# Patient Record
Sex: Female | Born: 1975 | Race: White | Hispanic: No | Marital: Single | Smoking: Former smoker
Health system: Southern US, Community
[De-identification: ages and names within clinical notes are randomized; demographics above are authoritative.]

## PROBLEM LIST (undated history)

## (undated) DIAGNOSIS — Z9889 Other specified postprocedural states: Secondary | ICD-10-CM

## (undated) DIAGNOSIS — K5900 Constipation, unspecified: Secondary | ICD-10-CM

## (undated) DIAGNOSIS — K219 Gastro-esophageal reflux disease without esophagitis: Secondary | ICD-10-CM

## (undated) DIAGNOSIS — G5601 Carpal tunnel syndrome, right upper limb: Secondary | ICD-10-CM

## (undated) DIAGNOSIS — Z87442 Personal history of urinary calculi: Secondary | ICD-10-CM

## (undated) DIAGNOSIS — R112 Nausea with vomiting, unspecified: Secondary | ICD-10-CM

## (undated) DIAGNOSIS — D0511 Intraductal carcinoma in situ of right breast: Secondary | ICD-10-CM

## (undated) DIAGNOSIS — G8929 Other chronic pain: Secondary | ICD-10-CM

## (undated) DIAGNOSIS — G43909 Migraine, unspecified, not intractable, without status migrainosus: Secondary | ICD-10-CM

## (undated) DIAGNOSIS — G589 Mononeuropathy, unspecified: Secondary | ICD-10-CM

## (undated) DIAGNOSIS — S149XXA Injury of unspecified nerves of neck, initial encounter: Secondary | ICD-10-CM

## (undated) DIAGNOSIS — D649 Anemia, unspecified: Secondary | ICD-10-CM

## (undated) DIAGNOSIS — F419 Anxiety disorder, unspecified: Secondary | ICD-10-CM

## (undated) DIAGNOSIS — T4145XA Adverse effect of unspecified anesthetic, initial encounter: Secondary | ICD-10-CM

## (undated) DIAGNOSIS — N939 Abnormal uterine and vaginal bleeding, unspecified: Secondary | ICD-10-CM

## (undated) DIAGNOSIS — T8859XA Other complications of anesthesia, initial encounter: Secondary | ICD-10-CM

## (undated) DIAGNOSIS — Z803 Family history of malignant neoplasm of breast: Secondary | ICD-10-CM

## (undated) DIAGNOSIS — M549 Dorsalgia, unspecified: Secondary | ICD-10-CM

## (undated) DIAGNOSIS — R0981 Nasal congestion: Secondary | ICD-10-CM

## (undated) HISTORY — DX: Family history of malignant neoplasm of breast: Z80.3

## (undated) HISTORY — DX: Carpal tunnel syndrome, right upper limb: G56.01

## (undated) HISTORY — PX: FRACTURE SURGERY: SHX138

## (undated) HISTORY — PX: SINUS ENDO W/FUSION: SHX777

## (undated) HISTORY — DX: Intraductal carcinoma in situ of right breast: D05.11

---

## 1998-05-13 ENCOUNTER — Encounter: Admission: RE | Admit: 1998-05-13 | Discharge: 1998-05-13 | Payer: Self-pay | Admitting: Sports Medicine

## 1998-07-06 ENCOUNTER — Encounter: Admission: RE | Admit: 1998-07-06 | Discharge: 1998-07-06 | Payer: Self-pay | Admitting: Family Medicine

## 1998-09-17 ENCOUNTER — Encounter: Admission: RE | Admit: 1998-09-17 | Discharge: 1998-09-17 | Payer: Self-pay | Admitting: Family Medicine

## 1998-09-20 ENCOUNTER — Encounter: Admission: RE | Admit: 1998-09-20 | Discharge: 1998-09-20 | Payer: Self-pay | Admitting: Family Medicine

## 1998-10-26 ENCOUNTER — Encounter: Admission: RE | Admit: 1998-10-26 | Discharge: 1998-10-26 | Payer: Self-pay | Admitting: Sports Medicine

## 1998-10-27 ENCOUNTER — Encounter: Admission: RE | Admit: 1998-10-27 | Discharge: 1998-10-27 | Payer: Self-pay | Admitting: Family Medicine

## 1998-11-03 ENCOUNTER — Encounter: Admission: RE | Admit: 1998-11-03 | Discharge: 1998-11-03 | Payer: Self-pay | Admitting: Family Medicine

## 1998-11-03 ENCOUNTER — Other Ambulatory Visit: Admission: RE | Admit: 1998-11-03 | Discharge: 1998-11-03 | Payer: Self-pay | Admitting: Family Medicine

## 1998-12-10 ENCOUNTER — Encounter: Admission: RE | Admit: 1998-12-10 | Discharge: 1998-12-10 | Payer: Self-pay | Admitting: Sports Medicine

## 1999-01-18 ENCOUNTER — Encounter: Admission: RE | Admit: 1999-01-18 | Discharge: 1999-01-18 | Payer: Self-pay | Admitting: Sports Medicine

## 1999-03-08 ENCOUNTER — Encounter: Admission: RE | Admit: 1999-03-08 | Discharge: 1999-03-08 | Payer: Self-pay | Admitting: Family Medicine

## 1999-04-06 ENCOUNTER — Encounter: Admission: RE | Admit: 1999-04-06 | Discharge: 1999-04-06 | Payer: Self-pay | Admitting: Family Medicine

## 1999-04-20 ENCOUNTER — Encounter: Admission: RE | Admit: 1999-04-20 | Discharge: 1999-04-20 | Payer: Self-pay | Admitting: Family Medicine

## 1999-06-06 ENCOUNTER — Encounter: Admission: RE | Admit: 1999-06-06 | Discharge: 1999-06-06 | Payer: Self-pay | Admitting: Family Medicine

## 1999-08-02 ENCOUNTER — Emergency Department (HOSPITAL_COMMUNITY): Admission: EM | Admit: 1999-08-02 | Discharge: 1999-08-02 | Payer: Self-pay | Admitting: Emergency Medicine

## 1999-09-14 ENCOUNTER — Encounter: Admission: RE | Admit: 1999-09-14 | Discharge: 1999-09-14 | Payer: Self-pay | Admitting: Family Medicine

## 1999-09-21 ENCOUNTER — Encounter: Admission: RE | Admit: 1999-09-21 | Discharge: 1999-09-21 | Payer: Self-pay | Admitting: Family Medicine

## 1999-09-28 ENCOUNTER — Encounter: Admission: RE | Admit: 1999-09-28 | Discharge: 1999-09-28 | Payer: Self-pay | Admitting: Family Medicine

## 1999-12-14 ENCOUNTER — Encounter: Admission: RE | Admit: 1999-12-14 | Discharge: 1999-12-14 | Payer: Self-pay | Admitting: Family Medicine

## 2000-01-02 ENCOUNTER — Encounter: Admission: RE | Admit: 2000-01-02 | Discharge: 2000-01-02 | Payer: Self-pay | Admitting: Family Medicine

## 2000-01-18 ENCOUNTER — Encounter: Admission: RE | Admit: 2000-01-18 | Discharge: 2000-01-18 | Payer: Self-pay | Admitting: Family Medicine

## 2000-02-10 ENCOUNTER — Other Ambulatory Visit: Admission: RE | Admit: 2000-02-10 | Discharge: 2000-02-10 | Payer: Self-pay | Admitting: Family Medicine

## 2000-02-10 ENCOUNTER — Encounter: Admission: RE | Admit: 2000-02-10 | Discharge: 2000-02-10 | Payer: Self-pay | Admitting: Family Medicine

## 2000-02-23 ENCOUNTER — Emergency Department (HOSPITAL_COMMUNITY): Admission: EM | Admit: 2000-02-23 | Discharge: 2000-02-23 | Payer: Self-pay | Admitting: Emergency Medicine

## 2000-02-23 ENCOUNTER — Encounter: Payer: Self-pay | Admitting: Emergency Medicine

## 2000-03-07 ENCOUNTER — Encounter: Admission: RE | Admit: 2000-03-07 | Discharge: 2000-03-07 | Payer: Self-pay | Admitting: Family Medicine

## 2000-03-20 ENCOUNTER — Encounter: Admission: RE | Admit: 2000-03-20 | Discharge: 2000-03-20 | Payer: Self-pay | Admitting: Family Medicine

## 2000-06-08 ENCOUNTER — Encounter: Admission: RE | Admit: 2000-06-08 | Discharge: 2000-06-08 | Payer: Self-pay | Admitting: Family Medicine

## 2000-06-15 ENCOUNTER — Encounter: Admission: RE | Admit: 2000-06-15 | Discharge: 2000-06-15 | Payer: Self-pay | Admitting: Family Medicine

## 2000-07-09 ENCOUNTER — Encounter: Admission: RE | Admit: 2000-07-09 | Discharge: 2000-07-09 | Payer: Self-pay | Admitting: Family Medicine

## 2000-07-11 ENCOUNTER — Encounter: Admission: RE | Admit: 2000-07-11 | Discharge: 2000-07-11 | Payer: Self-pay | Admitting: Family Medicine

## 2000-07-16 ENCOUNTER — Encounter: Admission: RE | Admit: 2000-07-16 | Discharge: 2000-07-16 | Payer: Self-pay | Admitting: Family Medicine

## 2000-09-07 ENCOUNTER — Encounter: Admission: RE | Admit: 2000-09-07 | Discharge: 2000-09-07 | Payer: Self-pay | Admitting: Family Medicine

## 2000-11-13 ENCOUNTER — Encounter: Admission: RE | Admit: 2000-11-13 | Discharge: 2000-11-13 | Payer: Self-pay | Admitting: Family Medicine

## 2000-11-15 ENCOUNTER — Encounter: Payer: Self-pay | Admitting: *Deleted

## 2000-11-15 ENCOUNTER — Encounter: Admission: RE | Admit: 2000-11-15 | Discharge: 2000-11-15 | Payer: Self-pay | Admitting: *Deleted

## 2000-11-19 ENCOUNTER — Encounter: Admission: RE | Admit: 2000-11-19 | Discharge: 2001-01-10 | Payer: Self-pay | Admitting: Sports Medicine

## 2000-11-28 ENCOUNTER — Encounter: Admission: RE | Admit: 2000-11-28 | Discharge: 2000-11-28 | Payer: Self-pay | Admitting: Family Medicine

## 2000-12-07 ENCOUNTER — Encounter: Admission: RE | Admit: 2000-12-07 | Discharge: 2000-12-07 | Payer: Self-pay | Admitting: Sports Medicine

## 2000-12-31 ENCOUNTER — Encounter: Admission: RE | Admit: 2000-12-31 | Discharge: 2000-12-31 | Payer: Self-pay | Admitting: Family Medicine

## 2001-01-29 ENCOUNTER — Encounter: Admission: RE | Admit: 2001-01-29 | Discharge: 2001-01-29 | Payer: Self-pay | Admitting: Family Medicine

## 2001-03-04 ENCOUNTER — Encounter: Admission: RE | Admit: 2001-03-04 | Discharge: 2001-03-04 | Payer: Self-pay | Admitting: Family Medicine

## 2001-03-15 ENCOUNTER — Encounter: Admission: RE | Admit: 2001-03-15 | Discharge: 2001-03-15 | Payer: Self-pay | Admitting: Family Medicine

## 2001-04-04 ENCOUNTER — Encounter: Admission: RE | Admit: 2001-04-04 | Discharge: 2001-04-04 | Payer: Self-pay | Admitting: Family Medicine

## 2001-04-04 ENCOUNTER — Other Ambulatory Visit: Admission: RE | Admit: 2001-04-04 | Discharge: 2001-04-04 | Payer: Self-pay | Admitting: *Deleted

## 2001-05-27 ENCOUNTER — Encounter: Admission: RE | Admit: 2001-05-27 | Discharge: 2001-05-27 | Payer: Self-pay | Admitting: Family Medicine

## 2001-06-11 ENCOUNTER — Encounter: Admission: RE | Admit: 2001-06-11 | Discharge: 2001-06-11 | Payer: Self-pay | Admitting: Family Medicine

## 2001-06-25 ENCOUNTER — Encounter: Admission: RE | Admit: 2001-06-25 | Discharge: 2001-06-25 | Payer: Self-pay | Admitting: Family Medicine

## 2001-08-07 ENCOUNTER — Encounter: Admission: RE | Admit: 2001-08-07 | Discharge: 2001-08-07 | Payer: Self-pay | Admitting: Family Medicine

## 2001-08-19 ENCOUNTER — Encounter: Admission: RE | Admit: 2001-08-19 | Discharge: 2001-08-19 | Payer: Self-pay | Admitting: Pediatrics

## 2001-08-27 ENCOUNTER — Encounter: Admission: RE | Admit: 2001-08-27 | Discharge: 2001-08-27 | Payer: Self-pay | Admitting: Family Medicine

## 2001-11-11 ENCOUNTER — Encounter: Admission: RE | Admit: 2001-11-11 | Discharge: 2001-11-11 | Payer: Self-pay | Admitting: Family Medicine

## 2002-02-03 ENCOUNTER — Encounter: Admission: RE | Admit: 2002-02-03 | Discharge: 2002-02-03 | Payer: Self-pay | Admitting: Family Medicine

## 2002-04-30 ENCOUNTER — Encounter: Admission: RE | Admit: 2002-04-30 | Discharge: 2002-04-30 | Payer: Self-pay | Admitting: Family Medicine

## 2002-07-23 ENCOUNTER — Encounter: Admission: RE | Admit: 2002-07-23 | Discharge: 2002-07-23 | Payer: Self-pay | Admitting: Family Medicine

## 2002-10-15 ENCOUNTER — Encounter: Admission: RE | Admit: 2002-10-15 | Discharge: 2002-10-15 | Payer: Self-pay | Admitting: Family Medicine

## 2002-11-18 ENCOUNTER — Encounter: Admission: RE | Admit: 2002-11-18 | Discharge: 2002-11-18 | Payer: Self-pay | Admitting: Family Medicine

## 2003-01-08 ENCOUNTER — Encounter: Admission: RE | Admit: 2003-01-08 | Discharge: 2003-01-08 | Payer: Self-pay | Admitting: Family Medicine

## 2003-01-23 ENCOUNTER — Encounter: Admission: RE | Admit: 2003-01-23 | Discharge: 2003-01-23 | Payer: Self-pay | Admitting: Family Medicine

## 2003-02-26 ENCOUNTER — Emergency Department (HOSPITAL_COMMUNITY): Admission: EM | Admit: 2003-02-26 | Discharge: 2003-02-26 | Payer: Self-pay | Admitting: Emergency Medicine

## 2003-02-26 ENCOUNTER — Encounter: Payer: Self-pay | Admitting: Emergency Medicine

## 2003-03-16 ENCOUNTER — Encounter: Admission: RE | Admit: 2003-03-16 | Discharge: 2003-03-16 | Payer: Self-pay | Admitting: Family Medicine

## 2003-04-08 ENCOUNTER — Emergency Department (HOSPITAL_COMMUNITY): Admission: EM | Admit: 2003-04-08 | Discharge: 2003-04-08 | Payer: Self-pay | Admitting: Emergency Medicine

## 2003-05-19 ENCOUNTER — Encounter: Admission: RE | Admit: 2003-05-19 | Discharge: 2003-05-19 | Payer: Self-pay | Admitting: Family Medicine

## 2003-06-29 ENCOUNTER — Encounter: Admission: RE | Admit: 2003-06-29 | Discharge: 2003-06-29 | Payer: Self-pay | Admitting: Sports Medicine

## 2003-07-10 ENCOUNTER — Encounter: Admission: RE | Admit: 2003-07-10 | Discharge: 2003-07-10 | Payer: Self-pay | Admitting: Family Medicine

## 2003-07-16 ENCOUNTER — Encounter: Admission: RE | Admit: 2003-07-16 | Discharge: 2003-07-16 | Payer: Self-pay | Admitting: Family Medicine

## 2003-08-25 ENCOUNTER — Encounter: Admission: RE | Admit: 2003-08-25 | Discharge: 2003-08-25 | Payer: Self-pay | Admitting: Family Medicine

## 2003-08-26 ENCOUNTER — Emergency Department (HOSPITAL_COMMUNITY): Admission: EM | Admit: 2003-08-26 | Discharge: 2003-08-26 | Payer: Self-pay | Admitting: Emergency Medicine

## 2003-08-27 ENCOUNTER — Emergency Department (HOSPITAL_COMMUNITY): Admission: EM | Admit: 2003-08-27 | Discharge: 2003-08-28 | Payer: Self-pay | Admitting: *Deleted

## 2003-08-31 ENCOUNTER — Encounter: Admission: RE | Admit: 2003-08-31 | Discharge: 2003-08-31 | Payer: Self-pay | Admitting: Family Medicine

## 2003-11-09 ENCOUNTER — Encounter: Admission: RE | Admit: 2003-11-09 | Discharge: 2003-11-09 | Payer: Self-pay | Admitting: Family Medicine

## 2003-12-22 ENCOUNTER — Other Ambulatory Visit: Admission: RE | Admit: 2003-12-22 | Discharge: 2003-12-22 | Payer: Self-pay | Admitting: Family Medicine

## 2003-12-22 ENCOUNTER — Encounter: Admission: RE | Admit: 2003-12-22 | Discharge: 2003-12-22 | Payer: Self-pay | Admitting: Family Medicine

## 2004-01-15 ENCOUNTER — Encounter: Admission: RE | Admit: 2004-01-15 | Discharge: 2004-01-15 | Payer: Self-pay | Admitting: Family Medicine

## 2004-01-18 ENCOUNTER — Encounter: Admission: RE | Admit: 2004-01-18 | Discharge: 2004-01-18 | Payer: Self-pay | Admitting: Family Medicine

## 2004-02-02 ENCOUNTER — Encounter: Admission: RE | Admit: 2004-02-02 | Discharge: 2004-02-02 | Payer: Self-pay | Admitting: Family Medicine

## 2004-02-04 ENCOUNTER — Encounter: Admission: RE | Admit: 2004-02-04 | Discharge: 2004-02-04 | Payer: Self-pay | Admitting: Family Medicine

## 2004-03-08 ENCOUNTER — Encounter: Admission: RE | Admit: 2004-03-08 | Discharge: 2004-03-08 | Payer: Self-pay | Admitting: Family Medicine

## 2004-06-27 ENCOUNTER — Encounter: Admission: RE | Admit: 2004-06-27 | Discharge: 2004-06-27 | Payer: Self-pay | Admitting: Family Medicine

## 2004-08-10 ENCOUNTER — Ambulatory Visit: Payer: Self-pay | Admitting: Family Medicine

## 2004-08-16 ENCOUNTER — Ambulatory Visit: Payer: Self-pay | Admitting: Family Medicine

## 2004-08-31 ENCOUNTER — Ambulatory Visit: Payer: Self-pay | Admitting: Family Medicine

## 2004-09-02 ENCOUNTER — Ambulatory Visit: Payer: Self-pay | Admitting: Family Medicine

## 2004-09-09 ENCOUNTER — Ambulatory Visit: Payer: Self-pay | Admitting: Sports Medicine

## 2004-09-16 ENCOUNTER — Ambulatory Visit: Payer: Self-pay | Admitting: Family Medicine

## 2004-11-23 ENCOUNTER — Inpatient Hospital Stay (HOSPITAL_COMMUNITY): Admission: AD | Admit: 2004-11-23 | Discharge: 2004-11-23 | Payer: Self-pay | Admitting: *Deleted

## 2004-11-25 ENCOUNTER — Ambulatory Visit: Payer: Self-pay | Admitting: Sports Medicine

## 2004-12-01 ENCOUNTER — Ambulatory Visit: Payer: Self-pay | Admitting: Family Medicine

## 2004-12-01 ENCOUNTER — Other Ambulatory Visit: Admission: RE | Admit: 2004-12-01 | Discharge: 2004-12-01 | Payer: Self-pay | Admitting: Family Medicine

## 2005-01-13 ENCOUNTER — Ambulatory Visit: Payer: Self-pay | Admitting: Sports Medicine

## 2005-01-31 ENCOUNTER — Ambulatory Visit: Payer: Self-pay | Admitting: Family Medicine

## 2005-03-09 ENCOUNTER — Ambulatory Visit: Payer: Self-pay | Admitting: Sports Medicine

## 2005-04-04 ENCOUNTER — Ambulatory Visit: Payer: Self-pay | Admitting: Family Medicine

## 2005-04-19 ENCOUNTER — Ambulatory Visit: Payer: Self-pay | Admitting: Family Medicine

## 2005-05-02 ENCOUNTER — Ambulatory Visit: Payer: Self-pay | Admitting: Family Medicine

## 2005-05-08 ENCOUNTER — Ambulatory Visit: Payer: Self-pay | Admitting: Family Medicine

## 2005-05-17 ENCOUNTER — Ambulatory Visit: Payer: Self-pay | Admitting: Family Medicine

## 2005-05-24 ENCOUNTER — Other Ambulatory Visit: Admission: RE | Admit: 2005-05-24 | Discharge: 2005-05-24 | Payer: Self-pay | Admitting: Family Medicine

## 2005-05-24 ENCOUNTER — Ambulatory Visit: Payer: Self-pay | Admitting: Family Medicine

## 2005-05-25 ENCOUNTER — Ambulatory Visit (HOSPITAL_COMMUNITY): Admission: RE | Admit: 2005-05-25 | Discharge: 2005-05-25 | Payer: Self-pay | Admitting: Family Medicine

## 2005-06-20 ENCOUNTER — Ambulatory Visit: Payer: Self-pay | Admitting: Family Medicine

## 2005-06-27 ENCOUNTER — Ambulatory Visit: Payer: Self-pay | Admitting: Sports Medicine

## 2005-07-26 ENCOUNTER — Ambulatory Visit: Payer: Self-pay | Admitting: Family Medicine

## 2005-07-31 ENCOUNTER — Ambulatory Visit (HOSPITAL_COMMUNITY): Admission: RE | Admit: 2005-07-31 | Discharge: 2005-07-31 | Payer: Self-pay | Admitting: Obstetrics and Gynecology

## 2005-08-03 ENCOUNTER — Ambulatory Visit: Payer: Self-pay | Admitting: Family Medicine

## 2005-08-18 ENCOUNTER — Inpatient Hospital Stay (HOSPITAL_COMMUNITY): Admission: AD | Admit: 2005-08-18 | Discharge: 2005-08-18 | Payer: Self-pay | Admitting: Obstetrics & Gynecology

## 2005-08-21 ENCOUNTER — Ambulatory Visit: Payer: Self-pay | Admitting: Sports Medicine

## 2005-09-07 ENCOUNTER — Ambulatory Visit: Payer: Self-pay | Admitting: Family Medicine

## 2005-09-11 ENCOUNTER — Ambulatory Visit: Payer: Self-pay | Admitting: Family Medicine

## 2005-10-05 ENCOUNTER — Ambulatory Visit: Payer: Self-pay | Admitting: *Deleted

## 2005-10-05 ENCOUNTER — Inpatient Hospital Stay (HOSPITAL_COMMUNITY): Admission: AD | Admit: 2005-10-05 | Discharge: 2005-10-05 | Payer: Self-pay | Admitting: Obstetrics & Gynecology

## 2005-10-18 ENCOUNTER — Ambulatory Visit: Payer: Self-pay | Admitting: Family Medicine

## 2005-10-24 ENCOUNTER — Ambulatory Visit (HOSPITAL_COMMUNITY): Admission: RE | Admit: 2005-10-24 | Discharge: 2005-10-24 | Payer: Self-pay | Admitting: Family Medicine

## 2005-11-02 ENCOUNTER — Inpatient Hospital Stay (HOSPITAL_COMMUNITY): Admission: AD | Admit: 2005-11-02 | Discharge: 2005-11-02 | Payer: Self-pay | Admitting: Family Medicine

## 2005-11-02 ENCOUNTER — Ambulatory Visit: Payer: Self-pay | Admitting: Family Medicine

## 2005-11-04 ENCOUNTER — Ambulatory Visit: Payer: Self-pay | Admitting: Obstetrics and Gynecology

## 2005-11-04 ENCOUNTER — Inpatient Hospital Stay (HOSPITAL_COMMUNITY): Admission: AD | Admit: 2005-11-04 | Discharge: 2005-11-04 | Payer: Self-pay | Admitting: Family Medicine

## 2005-11-14 ENCOUNTER — Ambulatory Visit: Payer: Self-pay | Admitting: Family Medicine

## 2005-11-22 ENCOUNTER — Ambulatory Visit: Payer: Self-pay | Admitting: Family Medicine

## 2005-11-29 ENCOUNTER — Ambulatory Visit: Payer: Self-pay | Admitting: Family Medicine

## 2005-12-01 ENCOUNTER — Ambulatory Visit: Payer: Self-pay | Admitting: Sports Medicine

## 2005-12-08 ENCOUNTER — Ambulatory Visit: Payer: Self-pay | Admitting: Family Medicine

## 2005-12-12 ENCOUNTER — Ambulatory Visit: Payer: Self-pay | Admitting: Family Medicine

## 2005-12-21 ENCOUNTER — Inpatient Hospital Stay (HOSPITAL_COMMUNITY): Admission: AD | Admit: 2005-12-21 | Discharge: 2005-12-21 | Payer: Self-pay | Admitting: Family Medicine

## 2005-12-21 ENCOUNTER — Ambulatory Visit: Payer: Self-pay | Admitting: Family Medicine

## 2005-12-22 ENCOUNTER — Ambulatory Visit: Payer: Self-pay | Admitting: Family Medicine

## 2005-12-28 ENCOUNTER — Ambulatory Visit: Payer: Self-pay | Admitting: Sports Medicine

## 2006-01-01 ENCOUNTER — Ambulatory Visit: Payer: Self-pay | Admitting: Obstetrics and Gynecology

## 2006-01-03 ENCOUNTER — Inpatient Hospital Stay (HOSPITAL_COMMUNITY): Admission: AD | Admit: 2006-01-03 | Discharge: 2006-01-05 | Payer: Self-pay | Admitting: *Deleted

## 2006-01-03 ENCOUNTER — Ambulatory Visit: Payer: Self-pay | Admitting: Certified Nurse Midwife

## 2006-01-09 ENCOUNTER — Ambulatory Visit: Payer: Self-pay | Admitting: Sports Medicine

## 2006-02-07 ENCOUNTER — Encounter (INDEPENDENT_AMBULATORY_CARE_PROVIDER_SITE_OTHER): Payer: Self-pay | Admitting: *Deleted

## 2006-02-07 LAB — CONVERTED CEMR LAB

## 2006-02-12 ENCOUNTER — Ambulatory Visit: Payer: Self-pay | Admitting: Family Medicine

## 2006-02-14 ENCOUNTER — Ambulatory Visit: Payer: Self-pay | Admitting: Family Medicine

## 2006-02-24 ENCOUNTER — Emergency Department (HOSPITAL_COMMUNITY): Admission: EM | Admit: 2006-02-24 | Discharge: 2006-02-24 | Payer: Self-pay | Admitting: Emergency Medicine

## 2006-03-19 ENCOUNTER — Ambulatory Visit: Payer: Self-pay | Admitting: Family Medicine

## 2006-03-25 ENCOUNTER — Emergency Department (HOSPITAL_COMMUNITY): Admission: EM | Admit: 2006-03-25 | Discharge: 2006-03-26 | Payer: Self-pay | Admitting: Emergency Medicine

## 2006-04-03 ENCOUNTER — Ambulatory Visit: Payer: Self-pay | Admitting: Family Medicine

## 2006-05-28 ENCOUNTER — Ambulatory Visit: Payer: Self-pay | Admitting: Family Medicine

## 2006-05-30 ENCOUNTER — Ambulatory Visit: Payer: Self-pay | Admitting: Family Medicine

## 2006-06-04 ENCOUNTER — Ambulatory Visit: Payer: Self-pay | Admitting: Family Medicine

## 2006-06-20 ENCOUNTER — Ambulatory Visit: Payer: Self-pay | Admitting: Family Medicine

## 2006-07-05 ENCOUNTER — Ambulatory Visit: Payer: Self-pay | Admitting: Family Medicine

## 2006-07-18 ENCOUNTER — Ambulatory Visit: Payer: Self-pay | Admitting: Sports Medicine

## 2006-08-21 ENCOUNTER — Ambulatory Visit: Payer: Self-pay | Admitting: Family Medicine

## 2006-08-22 ENCOUNTER — Emergency Department (HOSPITAL_COMMUNITY): Admission: EM | Admit: 2006-08-22 | Discharge: 2006-08-22 | Payer: Self-pay | Admitting: Emergency Medicine

## 2006-08-31 ENCOUNTER — Ambulatory Visit: Payer: Self-pay | Admitting: Family Medicine

## 2006-09-10 ENCOUNTER — Emergency Department (HOSPITAL_COMMUNITY): Admission: EM | Admit: 2006-09-10 | Discharge: 2006-09-10 | Payer: Self-pay | Admitting: Emergency Medicine

## 2006-09-13 ENCOUNTER — Ambulatory Visit: Payer: Self-pay | Admitting: Sports Medicine

## 2006-10-18 ENCOUNTER — Ambulatory Visit: Payer: Self-pay | Admitting: Sports Medicine

## 2006-10-31 ENCOUNTER — Ambulatory Visit: Payer: Self-pay | Admitting: Family Medicine

## 2006-11-07 ENCOUNTER — Emergency Department (HOSPITAL_COMMUNITY): Admission: EM | Admit: 2006-11-07 | Discharge: 2006-11-07 | Payer: Self-pay | Admitting: Emergency Medicine

## 2006-11-08 ENCOUNTER — Ambulatory Visit: Payer: Self-pay | Admitting: Family Medicine

## 2007-01-18 ENCOUNTER — Ambulatory Visit: Payer: Self-pay | Admitting: Family Medicine

## 2007-01-21 ENCOUNTER — Emergency Department (HOSPITAL_COMMUNITY): Admission: EM | Admit: 2007-01-21 | Discharge: 2007-01-21 | Payer: Self-pay | Admitting: Emergency Medicine

## 2007-01-31 DIAGNOSIS — L989 Disorder of the skin and subcutaneous tissue, unspecified: Secondary | ICD-10-CM | POA: Insufficient documentation

## 2007-01-31 DIAGNOSIS — M545 Low back pain, unspecified: Secondary | ICD-10-CM | POA: Insufficient documentation

## 2007-01-31 DIAGNOSIS — K5900 Constipation, unspecified: Secondary | ICD-10-CM | POA: Insufficient documentation

## 2007-01-31 DIAGNOSIS — L708 Other acne: Secondary | ICD-10-CM | POA: Insufficient documentation

## 2007-01-31 DIAGNOSIS — J309 Allergic rhinitis, unspecified: Secondary | ICD-10-CM | POA: Insufficient documentation

## 2007-01-31 DIAGNOSIS — Z789 Other specified health status: Secondary | ICD-10-CM | POA: Insufficient documentation

## 2007-02-01 ENCOUNTER — Encounter (INDEPENDENT_AMBULATORY_CARE_PROVIDER_SITE_OTHER): Payer: Self-pay | Admitting: *Deleted

## 2007-03-04 ENCOUNTER — Emergency Department (HOSPITAL_COMMUNITY): Admission: EM | Admit: 2007-03-04 | Discharge: 2007-03-04 | Payer: Self-pay | Admitting: Emergency Medicine

## 2007-03-29 ENCOUNTER — Ambulatory Visit: Payer: Self-pay | Admitting: Family Medicine

## 2007-03-29 DIAGNOSIS — J329 Chronic sinusitis, unspecified: Secondary | ICD-10-CM | POA: Insufficient documentation

## 2007-03-29 DIAGNOSIS — N926 Irregular menstruation, unspecified: Secondary | ICD-10-CM | POA: Insufficient documentation

## 2007-03-29 LAB — CONVERTED CEMR LAB: Beta hcg, urine, semiquantitative: NEGATIVE

## 2007-04-24 ENCOUNTER — Other Ambulatory Visit: Admission: RE | Admit: 2007-04-24 | Discharge: 2007-04-24 | Payer: Self-pay | Admitting: Family Medicine

## 2007-04-24 ENCOUNTER — Ambulatory Visit: Payer: Self-pay | Admitting: Family Medicine

## 2007-04-24 ENCOUNTER — Encounter: Payer: Self-pay | Admitting: Family Medicine

## 2007-04-24 DIAGNOSIS — R51 Headache: Secondary | ICD-10-CM | POA: Insufficient documentation

## 2007-04-24 DIAGNOSIS — R519 Headache, unspecified: Secondary | ICD-10-CM | POA: Insufficient documentation

## 2007-04-24 LAB — CONVERTED CEMR LAB
ALT: 9 units/L (ref 0–35)
Alkaline Phosphatase: 46 units/L (ref 39–117)
BUN: 11 mg/dL (ref 6–23)
CO2: 24 meq/L (ref 19–32)
Calcium: 9.3 mg/dL (ref 8.4–10.5)
Chlamydia, DNA Probe: NEGATIVE
Chloride: 106 meq/L (ref 96–112)
Direct LDL: 118 mg/dL — ABNORMAL HIGH
GC Probe Amp, Genital: NEGATIVE
HCT: 38.4 %
Hemoglobin: 13.3 g/dL
Platelets: 188 10*3/uL
Total Bilirubin: 0.4 mg/dL (ref 0.3–1.2)
WBC: 7.8 10*3/uL

## 2007-05-28 ENCOUNTER — Ambulatory Visit: Payer: Self-pay | Admitting: Family Medicine

## 2007-05-28 DIAGNOSIS — R87611 Atypical squamous cells cannot exclude high grade squamous intraepithelial lesion on cytologic smear of cervix (ASC-H): Secondary | ICD-10-CM | POA: Insufficient documentation

## 2007-06-04 ENCOUNTER — Telehealth: Payer: Self-pay | Admitting: *Deleted

## 2007-06-04 ENCOUNTER — Ambulatory Visit: Payer: Self-pay | Admitting: Sports Medicine

## 2007-06-04 DIAGNOSIS — R300 Dysuria: Secondary | ICD-10-CM | POA: Insufficient documentation

## 2007-06-04 LAB — CONVERTED CEMR LAB
Glucose, Urine, Semiquant: NEGATIVE
WBC, UA: NEGATIVE cells/hpf
Whiff Test: NEGATIVE
pH: 7

## 2007-06-06 ENCOUNTER — Encounter: Payer: Self-pay | Admitting: Family Medicine

## 2007-06-17 ENCOUNTER — Telehealth (INDEPENDENT_AMBULATORY_CARE_PROVIDER_SITE_OTHER): Payer: Self-pay | Admitting: *Deleted

## 2007-06-20 ENCOUNTER — Telehealth: Payer: Self-pay | Admitting: *Deleted

## 2007-06-21 ENCOUNTER — Ambulatory Visit: Payer: Self-pay | Admitting: Family Medicine

## 2007-06-21 DIAGNOSIS — L219 Seborrheic dermatitis, unspecified: Secondary | ICD-10-CM | POA: Insufficient documentation

## 2007-06-21 DIAGNOSIS — R229 Localized swelling, mass and lump, unspecified: Secondary | ICD-10-CM | POA: Insufficient documentation

## 2007-06-24 ENCOUNTER — Telehealth: Payer: Self-pay | Admitting: *Deleted

## 2009-02-02 ENCOUNTER — Emergency Department (HOSPITAL_COMMUNITY): Admission: EM | Admit: 2009-02-02 | Discharge: 2009-02-02 | Payer: Self-pay | Admitting: Emergency Medicine

## 2011-02-09 ENCOUNTER — Encounter (HOSPITAL_COMMUNITY): Payer: Medicaid Other | Attending: Obstetrics and Gynecology

## 2011-02-09 ENCOUNTER — Other Ambulatory Visit: Payer: Self-pay | Admitting: Obstetrics and Gynecology

## 2011-02-09 DIAGNOSIS — N920 Excessive and frequent menstruation with regular cycle: Secondary | ICD-10-CM | POA: Insufficient documentation

## 2011-02-09 DIAGNOSIS — Z538 Procedure and treatment not carried out for other reasons: Secondary | ICD-10-CM | POA: Insufficient documentation

## 2011-02-09 DIAGNOSIS — Z01812 Encounter for preprocedural laboratory examination: Secondary | ICD-10-CM | POA: Insufficient documentation

## 2011-02-09 LAB — SURGICAL PCR SCREEN
MRSA, PCR: NEGATIVE
Staphylococcus aureus: NEGATIVE

## 2011-02-09 LAB — CBC
HCT: 36 % (ref 36.0–46.0)
Hemoglobin: 12.6 g/dL (ref 12.0–15.0)
MCV: 89.1 fL (ref 78.0–100.0)
RBC: 4.04 MIL/uL (ref 3.87–5.11)

## 2011-02-09 LAB — HCG, QUANTITATIVE, PREGNANCY: hCG, Beta Chain, Quant, S: <2 m[IU]/mL (ref <5)

## 2011-02-14 ENCOUNTER — Other Ambulatory Visit: Payer: Self-pay | Admitting: Family Medicine

## 2011-02-14 ENCOUNTER — Ambulatory Visit (HOSPITAL_COMMUNITY)
Admission: RE | Admit: 2011-02-14 | Discharge: 2011-02-14 | Disposition: A | Discharge status: Home / Self Care | Payer: Medicaid Other | Source: Ambulatory Visit | Attending: Family Medicine | Admitting: Family Medicine

## 2011-02-14 ENCOUNTER — Ambulatory Visit (HOSPITAL_COMMUNITY)
Admission: RE | Admit: 2011-02-14 | Payer: Medicaid Other | Source: Ambulatory Visit | Admitting: Obstetrics and Gynecology

## 2011-02-14 DIAGNOSIS — R102 Pelvic and perineal pain: Secondary | ICD-10-CM

## 2011-02-14 DIAGNOSIS — R300 Dysuria: Secondary | ICD-10-CM

## 2011-02-14 DIAGNOSIS — R109 Unspecified abdominal pain: Secondary | ICD-10-CM | POA: Insufficient documentation

## 2011-02-14 DIAGNOSIS — N200 Calculus of kidney: Secondary | ICD-10-CM | POA: Insufficient documentation

## 2011-02-14 NOTE — H&P (Addendum)
  NAME:  Ashley [name], Ashley [name]               ACCOUNT NO.:  [id number]  MEDICAL RECORD NO.:  [id number]           PATIENT TYPE:  O  LOCATION:  DOIB                          FACILITY:  [facility]  PHYSICIAN:  Tilda Burrow, M.D. DATE OF BIRTH:  1976/08/01  DATE OF ADMISSION:  02/09/2011 DATE OF DISCHARGE:  LH                             HISTORY & PHYSICAL   ADMITTING DIAGNOSES: 1. Desire for elective permanent sterilization. 2. Heavy periods, requesting endometrial ablation.  HISTORY OF PRESENT ILLNESS:  A 35 year old female gravida 2, para 2, LMP January 17, 2011, on oral contraceptives for regular contraception, is admitted for hysteroscopy, D and C, endometrial ablation as well as laparoscopic tubal sterilization.  The patient has been counseled of the procedure, risks and benefits of the procedure, originally considered referral to [facility] but has agreed to [facility] procedure.  She is requesting permanent sterilization as well as endometrial ablation. Permanency of the procedure emphasized.  PAST MEDICAL HISTORY:  Benign.  SURGICAL HISTORY:  None other than general anesthesia for wisdom tooth extraction.  ALLERGIES:  BENADRYL, PENICILLIN, ASPIRIN, and HYDROCODONE.  She is on no medicines at present except for oral contraceptives.  PHYSICAL EXAMINATION:  GENERAL:  She is a healthy-appearing Caucasian female, alert and oriented x3. VITAL SIGNS:  Weight 127, blood pressure 120/70, pulse 70. HEENT:  Pupils equal, round, and reactive.  Nasal erythema only. CHEST:  Clear to auscultation. BREASTS:  Deferred. ABDOMEN:  Nontender without masses. GU:  External genitalia, well supported, normal for age.  Vaginal exam normal.  Uterus well supported, anterior, normal size, shape, and contour.  Adnexa negative for masses.  REVIEW OF SYSTEMS:  Periods described unacceptably heavy and somewhat irregular when not on pill.  PLAN:  Laparoscopic tubal sterilization, Falope rings,  hysteroscopy, D and C, endometrial ablation, February 14, 2011, at [facility].     Tilda Burrow, M.D.     JVF/MEDQ  D:  02/09/2011  T:  02/10/2011  Job:  [id number]  cc:   [facility]  Electronically Signed by Christin Bach M.D. on 02/10/2011 10:44:33 AM

## 2011-02-18 ENCOUNTER — Emergency Department (HOSPITAL_COMMUNITY)
Admission: EM | Admit: 2011-02-18 | Discharge: 2011-02-18 | Disposition: A | Discharge status: Home / Self Care | Payer: Medicaid Other | Attending: Emergency Medicine | Admitting: Emergency Medicine

## 2011-02-18 DIAGNOSIS — R102 Pelvic and perineal pain: Secondary | ICD-10-CM

## 2011-02-18 DIAGNOSIS — R109 Unspecified abdominal pain: Secondary | ICD-10-CM | POA: Insufficient documentation

## 2011-02-18 LAB — URINALYSIS, ROUTINE W REFLEX MICROSCOPIC
Bilirubin Urine: NEGATIVE
Glucose, UA: NEGATIVE mg/dL
Ketones, ur: NEGATIVE mg/dL
Leukocytes, UA: NEGATIVE
Nitrite: NEGATIVE
Protein, ur: NEGATIVE mg/dL
Urobilinogen, UA: 1 mg/dL (ref 0.0–1.0)

## 2011-02-18 LAB — CBC
Hemoglobin: 12.5 g/dL (ref 12.0–15.0)
MCV: 90.2 fL (ref 78.0–100.0)
RBC: 3.98 MIL/uL (ref 3.87–5.11)

## 2011-02-18 LAB — COMPREHENSIVE METABOLIC PANEL
ALT: 17 U/L (ref 0–35)
AST: 17 U/L (ref 0–37)
BUN: 7 mg/dL (ref 6–23)
CO2: 26 mEq/L (ref 19–32)
Creatinine, Ser: 0.73 mg/dL (ref 0.4–1.2)
GFR calc non Af Amer: >60 mL/min (ref >60)
Glucose, Bld: 103 mg/dL — ABNORMAL HIGH (ref 70–99)
Potassium: 3.6 mEq/L (ref 3.5–5.1)
Total Bilirubin: 0.3 mg/dL (ref 0.3–1.2)

## 2011-02-18 LAB — DIFFERENTIAL
Lymphocytes Relative: 32 % (ref 12–46)
Lymphs Abs: 1.8 10*3/uL (ref 0.7–4.0)
Neutrophils Relative %: 58 % (ref 43–77)

## 2011-02-18 LAB — URINE MICROSCOPIC-ADD ON

## 2011-02-20 LAB — URINE CULTURE
Colony Count: NO GROWTH
Culture  Setup Time: 201203182016
Culture: NO GROWTH

## 2011-02-23 ENCOUNTER — Emergency Department (HOSPITAL_COMMUNITY): Payer: Medicaid Other

## 2011-02-23 ENCOUNTER — Ambulatory Visit (HOSPITAL_COMMUNITY): Payer: Medicaid Other

## 2011-02-23 ENCOUNTER — Ambulatory Visit (HOSPITAL_COMMUNITY): Payer: Medicaid Other | Attending: Family Medicine

## 2011-02-23 DIAGNOSIS — N854 Malposition of uterus: Secondary | ICD-10-CM | POA: Insufficient documentation

## 2011-02-23 DIAGNOSIS — N949 Unspecified condition associated with female genital organs and menstrual cycle: Secondary | ICD-10-CM | POA: Insufficient documentation

## 2011-02-23 DIAGNOSIS — N83209 Unspecified ovarian cyst, unspecified side: Secondary | ICD-10-CM | POA: Insufficient documentation

## 2011-04-17 ENCOUNTER — Ambulatory Visit (INDEPENDENT_AMBULATORY_CARE_PROVIDER_SITE_OTHER): Payer: Self-pay | Admitting: Internal Medicine

## 2012-03-13 ENCOUNTER — Encounter (HOSPITAL_COMMUNITY): Payer: Self-pay | Admitting: *Deleted

## 2012-03-13 ENCOUNTER — Emergency Department (HOSPITAL_COMMUNITY)
Admission: EM | Admit: 2012-03-13 | Discharge: 2012-03-13 | Disposition: A | Discharge status: Home / Self Care | Payer: Medicaid Other | Attending: Emergency Medicine | Admitting: Emergency Medicine

## 2012-03-13 DIAGNOSIS — R6883 Chills (without fever): Secondary | ICD-10-CM | POA: Insufficient documentation

## 2012-03-13 DIAGNOSIS — R1110 Vomiting, unspecified: Secondary | ICD-10-CM

## 2012-03-13 DIAGNOSIS — R197 Diarrhea, unspecified: Secondary | ICD-10-CM | POA: Insufficient documentation

## 2012-03-13 DIAGNOSIS — R109 Unspecified abdominal pain: Secondary | ICD-10-CM | POA: Insufficient documentation

## 2012-03-13 DIAGNOSIS — N898 Other specified noninflammatory disorders of vagina: Secondary | ICD-10-CM | POA: Insufficient documentation

## 2012-03-13 HISTORY — DX: Nasal congestion: R09.81

## 2012-03-13 LAB — URINALYSIS, ROUTINE W REFLEX MICROSCOPIC
Bilirubin Urine: NEGATIVE
Specific Gravity, Urine: 1.015 (ref 1.005–1.030)
Urobilinogen, UA: 1 mg/dL (ref 0.0–1.0)

## 2012-03-13 LAB — URINE MICROSCOPIC-ADD ON

## 2012-03-13 MED ORDER — ONDANSETRON 8 MG PO TBDP
8.0000 mg | ORAL_TABLET | Freq: Once | ORAL | Status: AC
  Administered 2012-03-13: 8 mg via ORAL
  Filled 2012-03-13: qty 1

## 2012-03-13 MED ORDER — ONDANSETRON 8 MG PO TBDP: 8.0000 mg | ORAL_TABLET | Freq: Three times a day (TID) | ORAL | Status: AC | PRN

## 2012-03-13 NOTE — ED Notes (Signed)
Pt alert & oriented x4, stable gait. Pt given discharge instructions, paperwork & prescription(s). Patient instructed to stop at the registration desk to finish any additional paperwork. pt verbalized understanding. Pt left department w/ no further questions.  

## 2012-03-13 NOTE — Discharge Instructions (Signed)

## 2012-03-13 NOTE — ED Notes (Signed)
Pt states still has nausea, was able to drink without getting sick

## 2012-03-13 NOTE — ED Notes (Signed)
Also c/o vaginal bleeding, states period is not normal

## 2012-03-13 NOTE — ED Provider Notes (Signed)
History     CSN: [id number]  Arrival date & time 03/13/12  1750   First MD Initiated Contact with Patient 03/13/12 1944      Chief Complaint  Patient presents with  . Emesis     Patient is a 36 y.o. female presenting with vomiting. The history is provided by the patient.  Emesis  This is a recurrent problem. The current episode started 6 to 12 hours ago. The problem has not changed since onset.The emesis has an appearance of stomach contents. There has been no fever. Associated symptoms include abdominal pain and chills. Pertinent negatives include no diarrhea and no fever.  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 Sinus congestion     No past surgical history on file.  No family history on file.  History  Substance Use Topics  . Smoking status: Not on file  . Smokeless tobacco: Not on file  . Alcohol Use:     OB History    Grav Para Term Preterm Abortions TAB SAB Ect Mult Living                  Review of Systems  Constitutional: Positive for chills. Negative for fever.  Gastrointestinal: Positive for vomiting and abdominal pain. Negative for diarrhea.    Allergies  Penicillins; Aspirin; Vicodin; and Diphenhydramine hcl  Home Medications   Current Outpatient Rx  Name Route Sig Dispense Refill  . FLUTICASONE PROPIONATE 50 MCG/ACT NA SUSP Nasal Place 2 sprays into the nose daily.    . IBUPROFEN 800 MG PO TABS Oral Take 800 mg by mouth daily as needed. For pain    . NORETHIN ACE-ETH ESTRAD-FE 1.5-30 MG-MCG PO TABS Oral Take 1 tablet by mouth daily.    Marland Kitchen ONDANSETRON 8 MG PO  TBDP Oral Take 1 tablet (8 mg total) by mouth every 8 (eight) hours as needed for nausea. 20 tablet 0    BP 110/63  Pulse 90  Temp(Src) 97.5 F (36.4 C) (Oral)  Resp 20  Ht 5\' 4"  (1.626 m)  Wt 119 lb 14.4 oz (54.386 kg)  BMI 20.58 kg/m2  SpO2 100%  LMP 03/13/2012    Physical Exam CONSTITUTIONAL: Well developed/well nourished HEAD AND FACE: Normocephalic/atraumatic EYES: EOMI/PERRL, no icterus is noted ENMT: Mucous membranes moist NECK: supple no meningeal signs SPINE:entire spine nontender CV: S1/S2 noted, no murmurs/rubs/gallops noted LUNGS: Lungs are clear to auscultation bilaterally, no apparent distress ABDOMEN: soft, nontender, no rebound or guarding, +BS GU:no cva tenderness NEURO: Pt is awake/alert, moves all extremitiesx4 EXTREMITIES: pulses normal, full ROM SKIN: warm, color normal PSYCH: no abnormalities of mood noted  ED Course  Procedures   Labs Reviewed  URINALYSIS, ROUTINE W REFLEX MICROSCOPIC - Abnormal; Notable for the following:    Hgb urine dipstick TRACE (*)    Protein, ur TRACE (*)    All other components within normal limits  PREGNANCY, URINE  URINE MICROSCOPIC-ADD ON    1. Vomiting   2. Abdominal pain     Pt reports recent vomiting/diarrhea, this had improved but now vomiting returned.  Her abd exam is benign and I doubt  acute abd process.  Given vaginal bleeding I advised a pelvic exam but pt refused.  She is now taking PO fluids and would like to be discharged.  We discussed strict return precautions.  Advised to stop the zpack  The patient appears reasonably screened and/or stabilized for discharge and I doubt any other medical condition or other [facility] requiring further screening, evaluation, or treatment in the ED at this time prior to discharge.   MDM  Nursing notes reviewed and considered in documentation All labs/vitals reviewed and considered         Joya Gaskins, MD 03/13/12 2136

## 2012-03-13 NOTE — ED Notes (Signed)
Nausea and vomiting 

## 2012-03-13 NOTE — ED Notes (Signed)
Pt reports cramping, abdominal pain & n/v. Not sure if pregnant.

## 2012-04-17 ENCOUNTER — Other Ambulatory Visit: Payer: Self-pay | Admitting: Family Medicine

## 2012-04-18 ENCOUNTER — Encounter (HOSPITAL_COMMUNITY): Payer: Self-pay | Admitting: *Deleted

## 2012-04-18 ENCOUNTER — Emergency Department (HOSPITAL_COMMUNITY)
Admission: EM | Admit: 2012-04-18 | Discharge: 2012-04-18 | Disposition: A | Discharge status: Home / Self Care | Payer: Medicaid Other | Attending: Emergency Medicine | Admitting: Emergency Medicine

## 2012-04-18 ENCOUNTER — Encounter (HOSPITAL_COMMUNITY): Payer: Self-pay

## 2012-04-18 ENCOUNTER — Ambulatory Visit (HOSPITAL_COMMUNITY)
Admission: RE | Admit: 2012-04-18 | Discharge: 2012-04-18 | Disposition: A | Discharge status: Home / Self Care | Payer: Medicaid Other | Source: Ambulatory Visit | Attending: Family Medicine | Admitting: Family Medicine

## 2012-04-18 DIAGNOSIS — H109 Unspecified conjunctivitis: Secondary | ICD-10-CM | POA: Insufficient documentation

## 2012-04-18 DIAGNOSIS — H5710 Ocular pain, unspecified eye: Secondary | ICD-10-CM | POA: Insufficient documentation

## 2012-04-18 DIAGNOSIS — H53149 Visual discomfort, unspecified: Secondary | ICD-10-CM | POA: Insufficient documentation

## 2012-04-18 DIAGNOSIS — R51 Headache: Secondary | ICD-10-CM | POA: Insufficient documentation

## 2012-04-18 DIAGNOSIS — Z79899 Other long term (current) drug therapy: Secondary | ICD-10-CM | POA: Insufficient documentation

## 2012-04-18 DIAGNOSIS — H579 Unspecified disorder of eye and adnexa: Secondary | ICD-10-CM | POA: Insufficient documentation

## 2012-04-18 DIAGNOSIS — J329 Chronic sinusitis, unspecified: Secondary | ICD-10-CM | POA: Insufficient documentation

## 2012-04-18 MED ORDER — FLUORESCEIN SODIUM 1 MG OP STRP
ORAL_STRIP | OPHTHALMIC | Status: AC
  Administered 2012-04-18: 22:00:00
  Filled 2012-04-18: qty 1

## 2012-04-18 MED ORDER — TOBRAMYCIN 0.3 % OP SOLN
2.0000 [drp] | OPHTHALMIC | Status: DC
  Filled 2012-04-18: qty 5

## 2012-04-18 MED ORDER — TETRACAINE HCL 0.5 % OP SOLN
2.0000 [drp] | Freq: Once | OPHTHALMIC | Status: AC
  Administered 2012-04-18: 2 [drp] via OPHTHALMIC
  Filled 2012-04-18: qty 2

## 2012-04-18 NOTE — ED Provider Notes (Signed)
History     CSN: [id number]  Arrival date & time 04/18/12  Mikle Bosworth   First MD Initiated Contact with Patient 04/18/12 2125      Chief Complaint  Patient presents with  . Eye Pain    (Consider location/radiation/quality/duration/timing/severity/associated sxs/prior treatment) HPI Comments: Pt reports increasing irritation to the right eye. No hx of injury, trauma, welding or previous surgery . 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 She now has pain and burning sensation with light sensativity. Mild drainage from the right eye this AM.  The history is provided by the patient.    Past Medical History  Diagnosis Date  . Sinus congestion     History reviewed. No pertinent past surgical history.  No family history on file.  History  Substance Use Topics  . Smoking status: Never Smoker   . Smokeless tobacco: Not on file  . Alcohol Use: No    OB History    Grav Para Term Preterm Abortions TAB SAB Ect Mult Living                  Review of Systems  Constitutional: Negative for activity change.       All ROS Neg except as noted in HPI  HENT: Negative for nosebleeds and neck pain.   Eyes: Positive for photophobia, pain, redness and itching. Negative for discharge.  Respiratory: Negative for cough, shortness of breath and wheezing.   Cardiovascular: Negative for chest pain and palpitations.  Gastrointestinal: Negative for abdominal pain and blood in stool.  Genitourinary: Negative for dysuria, frequency and hematuria.  Musculoskeletal: Negative for back pain and arthralgias.  Skin: Negative.   Neurological: Positive for headaches. Negative for dizziness, seizures and speech difficulty.  Psychiatric/Behavioral: Negative for hallucinations and confusion.    Allergies  Penicillins; Aspirin; Vicodin; Diphenhydramine hcl; and Latex  Home Medications   Current Outpatient Rx  Name Route Sig Dispense Refill  . FLUTICASONE PROPIONATE 50 MCG/ACT NA SUSP  Nasal Place 2 sprays into the nose daily.    . IBUPROFEN 800 MG PO TABS Oral Take 800 mg by mouth daily as needed. For pain    . NORETHIN ACE-ETH ESTRAD-FE 1.5-30 MG-MCG PO TABS Oral Take 1 tablet by mouth every evening.     Marland Kitchen EYE DROPS OP Ophthalmic Apply 1-2 drops to eye once as needed. For pain in right eye      BP 124/87  Pulse 74  Temp(Src) 97.5 F (36.4 C) (Oral)  Resp 20  Ht 5\' 5"  (1.651 m)  Wt 120 lb (54.432 kg)  BMI 19.97 kg/m2  SpO2 100%  LMP 04/09/2012  Physical Exam  Nursing note and vitals reviewed. Constitutional: She is oriented to person, place, and time. She appears well-developed and well-nourished.  Non-toxic appearance.  HENT:  Head: Normocephalic.  Right Ear: Tympanic membrane and external ear normal.  Left Ear: Tympanic membrane and external ear normal.  Eyes: EOM and lids are normal. Pupils are equal, round, and reactive to light.       N0 lesions under the lids of the right eye or the left eye. The conjunctiva of the right eye shows increased redness. The anterior chambers are clear bilaterally. The extraocular movements are intact bilaterally. Using fluoroscopy seen and a Woods lamp, there is no corneal abrasion noted. Using the ophthalmoscope. There is no foreign body appreciated.  Neck: Normal range of motion. Neck supple. Carotid bruit is not present.  Cardiovascular: Normal rate, regular rhythm, normal  heart sounds, intact distal pulses and normal pulses.   Pulmonary/Chest: Breath sounds normal. No respiratory distress.  Abdominal: Soft. Bowel sounds are normal. There is no tenderness. There is no guarding.  Musculoskeletal: Normal range of motion.  Lymphadenopathy:       Head (right side): No submandibular adenopathy present.       Head (left side): No submandibular adenopathy present.    She has no cervical adenopathy.  Neurological: She is alert and oriented to person, place, and time. She has normal strength. No cranial nerve deficit or sensory  deficit.  Skin: Skin is warm and dry.  Psychiatric: She has a normal mood and affect. Her speech is normal.    ED Course  Procedures (including critical care time)  Labs Reviewed - No data to display [facility] Cm  04/18/2012  *RADIOLOGY REPORT*  Clinical Data:  Sinus congestion for years.  Medications are  no longer helping.  CT LIMITED SINUSES WITHOUT CONTRAST  Technique:  Multidetector CT images of the paranasal sinuses were obtained in a single plane without contrast.  Comparison:   None.  Findings:  Slight artifact from dental amalgam.  Overall study diagnostic.  Both divisions of the sphenoid sinus are clear.  The ethmoid sinuses are clear.  The frontal sinuses are clear.  There is slight dependent mucosal thickening in both maxillary sinuses.  Due to inferior ethmoid Haller cells, there may be mild infundibular stenosis bilaterally.  There is no significant nasal septal deviation.  Visualized intracranial compartment is negative.  IMPRESSION: Mild mucosal thickening in the dependent portion of both maxillary sinuses.  No significant sinus opacity or air-fluid level.  See comments above.  Original Report Authenticated By: Elsie Stain, M.D.     1. Conjunctivitis       MDM  *I have reviewed nursing notes, vital signs, and all appropriate lab and imaging results for this patient.** Tobramycin eye drops given. Pt to see specialist if not improving. Cool compress to the eye suggested,       Kathie Dike, [place] 04/19/12 1222

## 2012-04-18 NOTE — ED Notes (Signed)
Pt reports irritation to rt eye x 2 days,

## 2012-04-18 NOTE — Discharge Instructions (Signed)
Conjunctivitis Conjunctivitis is commonly called "pink eye." Conjunctivitis can be caused by bacterial or viral infection, allergies, or injuries. There is usually redness of the lining of the eye, itching, discomfort, and sometimes discharge. There may be deposits of matter along the eyelids. A viral infection usually causes a watery discharge, while a bacterial infection causes a yellowish, thick discharge. Pink eye is very contagious and spreads by direct contact. You may be given antibiotic eyedrops as part of your treatment. Before using your eye medicine, remove all drainage from the eye by washing gently with warm water and cotton balls. Continue to use the medication until you have awakened 2 mornings in a row without discharge from the eye. Do not rub your eye. This increases the irritation and helps spread infection. Use separate towels from other household members. Wash your hands with soap and water before and after touching your eyes. Use cold compresses to reduce pain and sunglasses to relieve irritation from light. Do not wear contact lenses or wear eye makeup until the infection is gone. SEEK MEDICAL CARE IF:   Your symptoms are not better after 3 days of treatment.   You have increased pain or trouble seeing.   The outer eyelids become very red or swollen.  Document Released: 12/28/2004 Document Revised: 11/09/2011 Document Reviewed: 11/20/2005 [employer] Patient Information 2012 [employer]. 

## 2012-04-21 NOTE — ED Provider Notes (Signed)
Medical screening examination/treatment/procedure(s) were performed by non-physician practitioner and as supervising physician I was immediately available for consultation/collaboration.  Leary Mcnulty, MD 04/21/12 0925 

## 2012-05-10 ENCOUNTER — Emergency Department (HOSPITAL_COMMUNITY)
Admission: EM | Admit: 2012-05-10 | Discharge: 2012-05-10 | Disposition: A | Discharge status: Home / Self Care | Payer: Medicaid Other | Attending: Emergency Medicine | Admitting: Emergency Medicine

## 2012-05-10 ENCOUNTER — Encounter (HOSPITAL_COMMUNITY): Payer: Self-pay | Admitting: *Deleted

## 2012-05-10 DIAGNOSIS — R51 Headache: Secondary | ICD-10-CM | POA: Insufficient documentation

## 2012-05-10 HISTORY — DX: Injury of unspecified nerves of neck, initial encounter: S14.9XXA

## 2012-05-10 HISTORY — DX: Mononeuropathy, unspecified: G58.9

## 2012-05-10 HISTORY — DX: Other chronic pain: G89.29

## 2012-05-10 HISTORY — DX: Dorsalgia, unspecified: M54.9

## 2012-05-10 LAB — CBC
MCV: 91.1 fL (ref 78.0–100.0)
Platelets: 175 10*3/uL (ref 150–400)
RBC: 4.06 MIL/uL (ref 3.87–5.11)
WBC: 5.6 10*3/uL (ref 4.0–10.5)

## 2012-05-10 LAB — BASIC METABOLIC PANEL
CO2: 22 mEq/L (ref 19–32)
Calcium: 9.3 mg/dL (ref 8.4–10.5)
Chloride: 102 mEq/L (ref 96–112)
Glucose, Bld: 95 mg/dL (ref 70–99)
Potassium: 3.4 mEq/L — ABNORMAL LOW (ref 3.5–5.1)
Sodium: 135 mEq/L (ref 135–145)

## 2012-05-10 LAB — DIFFERENTIAL
Eosinophils Relative: 0 % (ref 0–5)
Lymphocytes Relative: 24 % (ref 12–46)
Lymphs Abs: 1.4 10*3/uL (ref 0.7–4.0)

## 2012-05-10 MED ORDER — OXYCODONE-ACETAMINOPHEN 5-325 MG PO TABS: ORAL_TABLET | ORAL | Status: DC

## 2012-05-10 MED ORDER — KETOROLAC TROMETHAMINE 30 MG/ML IJ SOLN
30.0000 mg | Freq: Once | INTRAMUSCULAR | Status: AC
  Administered 2012-05-10: 30 mg via INTRAMUSCULAR

## 2012-05-10 MED ORDER — KETOROLAC TROMETHAMINE 30 MG/ML IJ SOLN
30.0000 mg | Freq: Once | INTRAMUSCULAR | Status: DC
  Filled 2012-05-10: qty 1

## 2012-05-10 MED ORDER — METOCLOPRAMIDE HCL 5 MG/ML IJ SOLN
10.0000 mg | Freq: Once | INTRAMUSCULAR | Status: AC
  Administered 2012-05-10: 10 mg via INTRAMUSCULAR

## 2012-05-10 MED ORDER — METOCLOPRAMIDE HCL 5 MG/ML IJ SOLN
10.0000 mg | Freq: Once | INTRAMUSCULAR | Status: DC
  Filled 2012-05-10: qty 2
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
## 2012-05-10 NOTE — ED Notes (Signed)
Pt stating she is wanting to go home. EDP advised.

## 2012-05-10 NOTE — ED Notes (Signed)
Headache for three days, vomiting that started today mixed with ?blood,

## 2012-05-10 NOTE — ED Notes (Signed)
Patient refusing IV. Wants to take medications IM. Dr Estell Harpin aware.Verbal order to change meds to IM obtained.

## 2012-05-10 NOTE — Discharge Instructions (Signed)
Follow up with your md next week. °

## 2012-05-10 NOTE — ED Notes (Signed)
Discharge instructions reviewed with pt, questions answered. Pt verbalized understanding.  

## 2012-05-13 ENCOUNTER — Emergency Department (HOSPITAL_COMMUNITY)
Admission: EM | Admit: 2012-05-13 | Discharge: 2012-05-13 | Disposition: A | Discharge status: Home / Self Care | Payer: Medicaid Other | Attending: Emergency Medicine | Admitting: Emergency Medicine

## 2012-05-13 ENCOUNTER — Encounter (HOSPITAL_COMMUNITY): Payer: Self-pay | Admitting: *Deleted

## 2012-05-13 DIAGNOSIS — R064 Hyperventilation: Secondary | ICD-10-CM | POA: Insufficient documentation

## 2012-05-13 DIAGNOSIS — N760 Acute vaginitis: Secondary | ICD-10-CM | POA: Insufficient documentation

## 2012-05-13 DIAGNOSIS — A499 Bacterial infection, unspecified: Secondary | ICD-10-CM | POA: Insufficient documentation

## 2012-05-13 DIAGNOSIS — B9689 Other specified bacterial agents as the cause of diseases classified elsewhere: Secondary | ICD-10-CM | POA: Insufficient documentation

## 2012-05-13 DIAGNOSIS — M549 Dorsalgia, unspecified: Secondary | ICD-10-CM | POA: Insufficient documentation

## 2012-05-13 DIAGNOSIS — G8929 Other chronic pain: Secondary | ICD-10-CM | POA: Insufficient documentation

## 2012-05-13 DIAGNOSIS — F458 Other somatoform disorders: Secondary | ICD-10-CM

## 2012-05-13 LAB — WET PREP, GENITAL
Trich, Wet Prep: NONE SEEN
Yeast Wet Prep HPF POC: NONE SEEN

## 2012-05-13 MED ORDER — ONDANSETRON 8 MG PO TBDP
8.0000 mg | ORAL_TABLET | Freq: Once | ORAL | Status: DC
  Filled 2012-05-13: qty 1

## 2012-05-13 MED ORDER — METRONIDAZOLE 500 MG PO TABS: 500.0000 mg | ORAL_TABLET | Freq: Two times a day (BID) | ORAL | Status: AC
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
## 2012-05-13 NOTE — Discharge Instructions (Signed)
YOU DO NOT HAVE TOXIC SHOCK SYNDROME!!!!! You hyperventilated from your anxiety thinking you have it!  You do have a minor vaginal infection called bacterial vaginosis, take the antibiotic until gone.   Bacterial Vaginosis Bacterial vaginosis (BV) is a vaginal infection where the normal balance of bacteria in the vagina is disrupted. The normal balance is then replaced by an overgrowth of certain bacteria. There are several different kinds of bacteria that can cause BV. BV is the most common vaginal infection in women of childbearing age. CAUSES   The cause of BV is not fully understood. BV develops when there is an increase or imbalance of harmful bacteria.   Some activities or behaviors can upset the normal balance of bacteria in the vagina and put women at increased risk including:   Having a new sex partner or multiple sex partners.   Douching.   Using an intrauterine device (IUD) for contraception.   It is not clear what role sexual activity plays in the development of BV. However, women that have never had sexual intercourse are rarely infected with BV.  Women do not get BV from toilet seats, bedding, swimming pools or from touching objects around them.  SYMPTOMS   Grey vaginal discharge.   A fish-like odor with discharge, especially after sexual intercourse.   Itching or burning of the vagina and vulva.   Burning or pain with urination.   Some women have no signs or symptoms at all.  DIAGNOSIS  Your caregiver must examine the vagina for signs of BV. Your caregiver will perform lab tests and look at the sample of vaginal fluid through a microscope. They will look for bacteria and abnormal cells (clue cells), a pH test higher than 4.5, and a positive amine test all associated with BV.  RISKS AND COMPLICATIONS   Pelvic inflammatory disease (PID).   Infections following gynecology surgery.   Developing HIV.   Developing herpes virus.  TREATMENT  Sometimes BV will clear up  without treatment. However, all women with symptoms of BV should be treated to avoid complications, especially if gynecology surgery is planned. Female partners generally do not need to be treated. However, BV may spread between female sex partners so treatment is helpful in preventing a recurrence of BV.   BV may be treated with antibiotics. The antibiotics come in either pill or vaginal cream forms. Either can be used with nonpregnant or pregnant women, but the recommended dosages differ. These antibiotics are not harmful to the baby.   BV can recur after treatment. If this happens, a second round of antibiotics will often be prescribed.   Treatment is important for pregnant women. If not treated, BV can cause a premature delivery, especially for a pregnant woman who had a premature birth in the past. All pregnant women who have symptoms of BV should be checked and treated.   For chronic reoccurrence of BV, treatment with a type of prescribed gel vaginally twice a week is helpful.  HOME CARE INSTRUCTIONS   Finish all medication as directed by your caregiver.   Do not have sex until treatment is completed.   Tell your sexual partner that you have a vaginal infection. They should see their caregiver and be treated if they have problems, such as a mild rash or itching.   Practice safe sex. Use condoms. Only have 1 sex partner.  PREVENTION  Basic prevention steps can help reduce the risk of upsetting the natural balance of bacteria in the vagina and developing  BV:  Do not have sexual intercourse (be abstinent).   Do not douche.   Use all of the medicine prescribed for treatment of BV, even if the signs and symptoms go away.   Tell your sex partner if you have BV. That way, they can be treated, if needed, to prevent reoccurrence.  SEEK MEDICAL CARE IF:   Your symptoms are not improving after 3 days of treatment.   You have increased discharge, pain, or fever.  MAKE SURE YOU:    Understand these instructions.   Will watch your condition.   Will get help right away if you are not doing well or get worse.  FOR MORE INFORMATION  Division of STD Prevention (DSTDP), Centers for Disease Control and Prevention: [employer] American Social Health Association (ASHA): www.ashastd.org  Document Released: 11/20/2005 Document Revised: 11/09/2011 Document Reviewed: 05/13/2009 [facility] Patient Information 2012 [place], [place].Hyperventilation Syndrome Hyperventilation is fast and shallow breathing. This type of breathing may actually make you feel breathless. This type of breathing may make you think you have to catch your breath. Because of feeling like you cannot get enough air, you have been breathing more than normal (overbreathing). You are blowing off too much carbon dioxide which changes your acid-base balance. This leads to the tingling and numb feelings in your fingers, toes, and around the mouth. If this continues your fingers, hands and toes may go into spasm and curl up. You may feel as though you are going to die. Hyperventilation is usually associated with anxiety and panicky feelings. Often feelings of panic and rapid breathing become a vicious cycle. Panic leads to rapid breathing while breathing rapidly can make you feel panicky.  If you hyperventilate you may not even be aware of it. Instead you may be aware of the associated symptoms including:  Chest pains.   Palpitations.   Dizziness.   Lightheadedness.   Dry mouth.   Weakness.   Confusion.   Tingling sensations.   Sleep disturbance.  One common question is, "Why did this happen to me while I was sleeping. I was not anxious." The answer is unknown. One Nelva Bush is that it may occur during dream activity. It may disturb you more when it wakes you from a sound sleep. CAUSES  Although anxiety and/or panic attacks are some of the most common causes for the syndrome, other causes  include:  Nervousness.   Stress.   Stimulant use.   Lung Disease.   Infections like pneumonia.   Heart problems.   Severe pain.   Waking from a bad dream.   Drugs or alcohol.   Pregnancy   Bleeding.  TREATMENT  Re-breathing into a paper bag (NOT plastic) or mask with a tube on it in the hospital or ambulance physically changes the carbon dioxide level. The breathing rate will slow down. The numbness and tingling will slowly go away. You might still feel shaky.   If anxiety or panic persist, a therapist or psychiatrist may be helpful in understanding and treating the condition.   Learn breathing exercises that help you breathe from your diaphragm and abdomen.   Practice relaxation techniques such as visualization, meditation, and muscle release.  Document Released: 11/17/2000 Document Revised: 11/09/2011 Document Reviewed: 09/07/2009 [facility] Patient Information 2012 [place], [place].Hyperventilation Syndrome Hyperventilation is fast and shallow breathing. This type of breathing may actually make you feel breathless. This type of breathing may make you think you have to catch your breath. Because of feeling like you cannot get enough air, you  have been breathing more than normal (overbreathing). You are blowing off too much carbon dioxide which changes your acid-base balance. This leads to the tingling and numb feelings in your fingers, toes, and around the mouth. If this continues your fingers, hands and toes may go into spasm and curl up. You may feel as though you are going to die. Hyperventilation is usually associated with anxiety and panicky feelings. Often feelings of panic and rapid breathing become a vicious cycle. Panic leads to rapid breathing while breathing rapidly can make you feel panicky.  If you hyperventilate you may not even be aware of it. Instead you may be aware of the associated symptoms including:  Chest pains.   Palpitations.   Dizziness.    Lightheadedness.   Dry mouth.   Weakness.   Confusion.   Tingling sensations.   Sleep disturbance.  One common question is, "Why did this happen to me while I was sleeping. I was not anxious." The answer is unknown. One Nelva Bush is that it may occur during dream activity. It may disturb you more when it wakes you from a sound sleep. CAUSES  Although anxiety and/or panic attacks are some of the most common causes for the syndrome, other causes include:  Nervousness.   Stress.   Stimulant use.   Lung Disease.   Infections like pneumonia.   Heart problems.   Severe pain.   Waking from a bad dream.   Drugs or alcohol.   Pregnancy   Bleeding.  TREATMENT  Re-breathing into a paper bag (NOT plastic) or mask with a tube on it in the hospital or ambulance physically changes the carbon dioxide level. The breathing rate will slow down. The numbness and tingling will slowly go away. You might still feel shaky.   If anxiety or panic persist, a therapist or psychiatrist may be helpful in understanding and treating the condition.   Learn breathing exercises that help you breathe from your diaphragm and abdomen.   Practice relaxation techniques such as visualization, meditation, and muscle release.  Document Released: 11/17/2000 Document Revised: 11/09/2011 Document Reviewed: 09/07/2009 [facility] Patient Information 2012 [place], [place].

## 2012-05-13 NOTE — ED Notes (Signed)
Nausea, feels weak,  Left a tampon in for 3 days.  Thinks she may have toxic shock,  Feels anxious. Seen here Friday for vomiting blood.

## 2012-05-15 LAB — GC/CHLAMYDIA PROBE AMP, GENITAL: Chlamydia, DNA Probe: NEGATIVE

## 2012-11-07 ENCOUNTER — Encounter (HOSPITAL_COMMUNITY): Payer: Self-pay

## 2012-11-07 ENCOUNTER — Emergency Department (HOSPITAL_COMMUNITY)
Admission: EM | Admit: 2012-11-07 | Discharge: 2012-11-07 | Disposition: A | Discharge status: Home / Self Care | Payer: Medicaid Other | Attending: Emergency Medicine | Admitting: Emergency Medicine

## 2012-11-07 DIAGNOSIS — R0602 Shortness of breath: Secondary | ICD-10-CM | POA: Insufficient documentation

## 2012-11-07 DIAGNOSIS — T887XXA Unspecified adverse effect of drug or medicament, initial encounter: Secondary | ICD-10-CM

## 2012-11-07 DIAGNOSIS — Z79899 Other long term (current) drug therapy: Secondary | ICD-10-CM | POA: Insufficient documentation

## 2012-11-07 DIAGNOSIS — T45515A Adverse effect of anticoagulants, initial encounter: Secondary | ICD-10-CM | POA: Insufficient documentation

## 2012-11-07 DIAGNOSIS — G43909 Migraine, unspecified, not intractable, without status migrainosus: Secondary | ICD-10-CM | POA: Insufficient documentation

## 2012-11-07 DIAGNOSIS — Z8709 Personal history of other diseases of the respiratory system: Secondary | ICD-10-CM | POA: Insufficient documentation

## 2012-11-07 DIAGNOSIS — G8929 Other chronic pain: Secondary | ICD-10-CM | POA: Insufficient documentation

## 2012-11-07 DIAGNOSIS — Z8669 Personal history of other diseases of the nervous system and sense organs: Secondary | ICD-10-CM | POA: Insufficient documentation

## 2012-11-07 DIAGNOSIS — M549 Dorsalgia, unspecified: Secondary | ICD-10-CM | POA: Insufficient documentation

## 2012-11-07 HISTORY — DX: Migraine, unspecified, not intractable, without status migrainosus: G43.909

## 2012-11-07 MED ORDER — FAMOTIDINE 20 MG PO TABS
20.0000 mg | ORAL_TABLET | Freq: Once | ORAL | Status: AC
  Administered 2012-11-07: 20 mg via ORAL
  Filled 2012-11-07: qty 1

## 2012-11-07 MED ORDER — IBUPROFEN 800 MG PO TABS
800.0000 mg | ORAL_TABLET | Freq: Once | ORAL | Status: AC
  Administered 2012-11-07: 800 mg via ORAL
  Filled 2012-11-07: qty 1
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
## 2012-11-07 NOTE — ED Notes (Signed)
Pt was prescribed, topamax yesterday for migraines, thinks she is having an allergic reaction to the medication.

## 2012-11-09 NOTE — ED Provider Notes (Signed)
Medical screening examination/treatment/procedure(s) were performed by non-physician practitioner and as supervising physician I was immediately available for consultation/collaboration.   Mikyle Sox M Alicyn Klann, MD 11/09/12 1011 

## 2013-04-01 ENCOUNTER — Telehealth: Payer: Self-pay | Admitting: Family Medicine

## 2013-04-01 MED ORDER — OLOPATADINE HCL 0.2 % OP SOLN: 2.0000 [drp] | Freq: Every day | OPHTHALMIC | Status: DC

## 2013-04-01 NOTE — Telephone Encounter (Signed)
Pt called RX to pharmacy

## 2013-04-01 NOTE — Telephone Encounter (Signed)
Call out pataday 2 gtts each eye qday

## 2013-05-13 ENCOUNTER — Encounter: Payer: Self-pay | Admitting: Family Medicine

## 2013-05-13 ENCOUNTER — Ambulatory Visit (INDEPENDENT_AMBULATORY_CARE_PROVIDER_SITE_OTHER): Payer: Medicaid Other | Admitting: Family Medicine

## 2013-05-13 VITALS — BP 100/62 | HR 80 | Temp 98.0°F | Resp 16 | Ht 64.0 in | Wt 124.0 lb

## 2013-05-13 DIAGNOSIS — Z124 Encounter for screening for malignant neoplasm of cervix: Secondary | ICD-10-CM

## 2013-05-13 DIAGNOSIS — Z0000 Encounter for general adult medical examination without abnormal findings: Secondary | ICD-10-CM
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
## 2013-05-14 ENCOUNTER — Other Ambulatory Visit: Payer: Self-pay | Admitting: Family Medicine

## 2013-05-14 DIAGNOSIS — IMO0002 Reserved for concepts with insufficient information to code with codable children: Secondary | ICD-10-CM

## 2013-05-14 LAB — COMPLETE METABOLIC PANEL WITH GFR
ALT: 21 U/L (ref 0–35)
AST: 18 U/L (ref 0–37)
CO2: 27 mEq/L (ref 19–32)
GFR, Est African American: >89 mL/min
Sodium: 138 mEq/L (ref 135–145)
Total Bilirubin: 0.5 mg/dL (ref 0.3–1.2)
Total Protein: 6.9 g/dL (ref 6.0–8.3)

## 2013-05-14 LAB — CBC WITH DIFFERENTIAL/PLATELET
Basophils Absolute: 0.1 10*3/uL (ref 0.0–0.1)
Eosinophils Absolute: 0.1 10*3/uL (ref 0.0–0.7)
Lymphocytes Relative: 27 % (ref 12–46)
Lymphs Abs: 2 10*3/uL (ref 0.7–4.0)
MCH: 29.9 pg (ref 26.0–34.0)
Neutrophils Relative %: 66 % (ref 43–77)
Platelets: 223 10*3/uL (ref 150–400)
RBC: 4.28 MIL/uL (ref 3.87–5.11)
RDW: 12.9 % (ref 11.5–15.5)
WBC: 7.6 10*3/uL (ref 4.0–10.5)

## 2013-05-14 LAB — PAP IG (IMAGE GUIDED)

## 2013-05-16 ENCOUNTER — Encounter: Payer: Self-pay | Admitting: Family Medicine

## 2013-06-11 ENCOUNTER — Encounter: Payer: Self-pay | Admitting: Family Medicine

## 2013-06-11 ENCOUNTER — Other Ambulatory Visit (HOSPITAL_COMMUNITY)
Admission: RE | Admit: 2013-06-11 | Discharge: 2013-06-11 | Disposition: A | Discharge status: Home / Self Care | Payer: Medicaid Other | Source: Ambulatory Visit | Attending: Family Medicine | Admitting: Family Medicine

## 2013-06-11 ENCOUNTER — Ambulatory Visit (INDEPENDENT_AMBULATORY_CARE_PROVIDER_SITE_OTHER): Payer: Medicaid Other | Admitting: Family Medicine

## 2013-06-11 VITALS — BP 122/81 | HR 83 | Temp 97.0°F | Ht 64.0 in | Wt 126.1 lb

## 2013-06-11 DIAGNOSIS — R87611 Atypical squamous cells cannot exclude high grade squamous intraepithelial lesion on cytologic smear of cervix (ASC-H): Secondary | ICD-10-CM | POA: Insufficient documentation

## 2013-06-11 NOTE — Progress Notes (Signed)
Patient thinks she may have had cryotherapy before at [facility] but doesn't remember- has had unprotected sex x2 last Saturday.

## 2013-06-11 NOTE — Assessment & Plan Note (Signed)
Treatment will be based on results.
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
## 2013-06-11 NOTE — Patient Instructions (Signed)
Colposcopy Colposcopy is a procedure that uses a special lighted microscope (colposcope). It examines your cervix and vagina, or the area around the outside of the vagina, for signs of disease or abnormalities in the cells. You may be sent to a specialist (gynecologist) to do the colposcopy. A biopsy (tissue sample) may be collected during a colposcopy, if the caregiver finds any unusual cells. The biopsy is sent to the lab for further testing, and the results are reported back to your caregiver. A WOMAN MAY NEED THIS PROCEDURE IF:  She has had an abnormal pap smear (taking cells from the cervix for testing).  She has a sore on her cervix, and a Pap test was normal.  The Pap test suggests human papilloma virus (HPV). This virus can cause genital warts and is linked to the development of cervical cancer.  She has genital warts on the cervix, or in or around the outside of the vagina.  Her mother took the drug DES while pregnant.  She has painful intercourse.  She has vaginal bleeding, especially after sexual intercourse.  There is a need to evaluate the results of previous treatment. BEFORE THE PROCEDURE   Colposcopy is done when you are not having a menstrual period.  For 24 hours before the colposcopy, do not:  Douche.  Use tampons.  Use medicines, creams, or suppositories in the vagina.  Have sexual intercourse. PROCEDURE   A colposcopy is done while a woman is lying on her back with her feet in foot rests (stirrups).  A speculum is placed inside the vagina to keep it open and to allow the caregiver to see the cervix. This is the same instrument used to do a pap smear.  The colposcope is placed outside the vagina. It is used to magnify and examine the cervix, vagina, and the area around the outside of the vagina.  A small amount of liquid solution is placed on the area that is to be viewed. This solution is placed on with a cotton applicator. This solution makes it easier to  see the abnormal cells.  Your caregiver will suck out mucus and cells from the canal of the cervix.  Small pieces of tissue for biopsy may be taken at the same time. You may feel mild pain or discomfort when this is done.  Your caregiver will record the location of the abnormal areas and send the tissue samples to a lab for analysis.  If your caregiver biopsies the vagina or outside of the vagina, a local anesthetic (novocaine) is usually given. AFTER THE PROCEDURE   You may have some cramping that often goes away in a few minutes. You may have some soreness for a couple of days.  You may take over-the-counter pain medicine as advised by your caregiver. Do not take aspirin because it can cause bleeding.  Lie down for a few minutes if you feel lightheaded.  You may have some bleeding or dark discharge that should stop in a few days.  You may need to wear a sanitary pad for a few days. HOME CARE INSTRUCTIONS   Avoid sex, douching, and using tampons for a week or as directed.  Only take medicine as directed by your caregiver.  Continue to take birth control pills, if you are on them.  Not all test results are available during your visit. If your test results are not back during the visit, make an appointment with your caregiver to find out the results. Do not assume everything is   normal if you have not heard from your caregiver or the medical facility. It is important for you to follow up on all of your test results.  Follow your caregiver's advice regarding medicines, activity, follow-up visits, and follow-up Pap tests. SEEK MEDICAL CARE IF:   You develop a rash.  You have problems with your medicine. SEEK IMMEDIATE MEDICAL CARE IF:  You are bleeding heavily or are passing blood clots.  You develop a fever over 102 F (38.9 C), with or without chills.  You have abnormal vaginal discharge.  You are having cramps that do not go away after taking your pain medicine.  You  feel lightheaded, dizzy, or faint.  You develop stomach pain. Document Released: 02/10/2003 Document Revised: 02/12/2012 Document Reviewed: 09/23/2009 Exit[employer] Patient Information 2014 [employer].  

## 2013-06-16 ENCOUNTER — Telehealth: Payer: Self-pay | Admitting: Obstetrics and Gynecology

## 2013-06-16 NOTE — Telephone Encounter (Addendum)
Message copied by Toula Moos on Mon Jun 16, 2013 11:49 AM   ------ called patient and gave result as stated below and to f/u pap in 12 months and 24 months. Patient states understanding and satisfied.        Message from: Reva Bores      Created: Sat Jun 14, 2013  8:57 AM       Pt. Needs repeat pap and co-testing at 12 and 24 months.  Biopsy was negative.  Please inform pt. ------

## 2013-06-18 ENCOUNTER — Telehealth: Payer: Self-pay | Admitting: Family Medicine

## 2013-06-18 NOTE — Telephone Encounter (Signed)
Called pt and all questions answered

## 2013-06-20 ENCOUNTER — Encounter: Payer: Medicaid Other | Admitting: Obstetrics & Gynecology

## 2013-07-04 ENCOUNTER — Encounter: Payer: Self-pay | Admitting: *Deleted

## 2013-08-01 ENCOUNTER — Encounter: Payer: Self-pay | Admitting: Family Medicine

## 2013-08-01 ENCOUNTER — Ambulatory Visit (INDEPENDENT_AMBULATORY_CARE_PROVIDER_SITE_OTHER): Payer: Medicaid Other | Admitting: Family Medicine

## 2013-08-01 VITALS — BP 100/70 | HR 80 | Temp 97.9°F | Resp 14 | Wt 128.0 lb

## 2013-08-01 DIAGNOSIS — J019 Acute sinusitis, unspecified: Secondary | ICD-10-CM

## 2013-08-01 MED ORDER — AZITHROMYCIN 250 MG PO TABS: ORAL_TABLET | ORAL | Status: DC
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
## 2013-08-31 ENCOUNTER — Telehealth: Payer: Self-pay | Admitting: Family Medicine

## 2013-08-31 NOTE — Telephone Encounter (Signed)
Pt called had sinus pressure driange and HA for past week. Seen in local ED on Friday treated for migraine, HA improved but sinuses persist Requested zpak As recently evaluated in ED I agreed to give antibiotics - zpak given/ nasal saline/mucniex OTC  Advised she will need to come in for visit if worsening and policy is to not give antibiotics without being evaluated

## 2013-09-01 ENCOUNTER — Encounter: Payer: Self-pay | Admitting: Family Medicine

## 2013-09-01 ENCOUNTER — Ambulatory Visit (INDEPENDENT_AMBULATORY_CARE_PROVIDER_SITE_OTHER): Payer: Medicaid Other | Admitting: Family Medicine

## 2013-09-01 VITALS — BP 110/80 | HR 100 | Temp 98.4°F | Resp 23 | Wt 127.0 lb

## 2013-09-01 DIAGNOSIS — J020 Streptococcal pharyngitis: Secondary | ICD-10-CM

## 2013-09-01 DIAGNOSIS — R3129 Other microscopic hematuria: Secondary | ICD-10-CM

## 2013-09-01 DIAGNOSIS — J029 Acute pharyngitis, unspecified: Secondary | ICD-10-CM

## 2013-09-01 DIAGNOSIS — J069 Acute upper respiratory infection, unspecified: Secondary | ICD-10-CM

## 2013-09-01 DIAGNOSIS — R509 Fever, unspecified: Secondary | ICD-10-CM

## 2013-09-01 DIAGNOSIS — Z3200 Encounter for pregnancy test, result unknown: Secondary | ICD-10-CM

## 2013-09-01 DIAGNOSIS — N390 Urinary tract infection, site not specified: Secondary | ICD-10-CM

## 2013-09-01 DIAGNOSIS — G43909 Migraine, unspecified, not intractable, without status migrainosus: Secondary | ICD-10-CM | POA: Insufficient documentation

## 2013-09-01 LAB — URINALYSIS, MICROSCOPIC ONLY
Casts: NONE SEEN
Crystals: NONE SEEN

## 2013-09-01 LAB — INFLUENZA A AND B
Inflenza A Ag: NEGATIVE
Influenza B Ag: NEGATIVE

## 2013-09-01 LAB — URINALYSIS, ROUTINE W REFLEX MICROSCOPIC
Glucose, UA: NEGATIVE mg/dL
Specific Gravity, Urine: >1.03 — ABNORMAL HIGH (ref 1.005–1.030)
pH: 6 (ref 5.0–8.0)

## 2013-09-01 LAB — RAPID STREP SCREEN (MED CTR MEBANE ONLY): Streptococcus, Group A Screen (Direct): POSITIVE — AB

## 2013-09-01 MED ORDER — KETOROLAC TROMETHAMINE 60 MG/2ML IM SOLN
60.0000 mg | Freq: Once | INTRAMUSCULAR | Status: AC
  Administered 2013-09-01: 60 mg via INTRAMUSCULAR

## 2013-09-01 MED ORDER — PROMETHAZINE HCL 25 MG/ML IJ SOLN
25.0000 mg | Freq: Once | INTRAMUSCULAR | Status: AC
  Administered 2013-09-01: 25 mg via INTRAMUSCULAR

## 2013-09-01 NOTE — Assessment & Plan Note (Signed)
Treat strep, I think virus started all symptoms She is highly anxious and has many somatic complaints Push fluids, rest, given work note for tomorrow Obtain records from [facility]

## 2013-09-01 NOTE — Progress Notes (Signed)
  Subjective:    Patient ID: Ashley [name], female    DOB: May 21, 1976, 37 y.o.   MRN: [id number]  HPI  Pt here with many complaints. Note called Emergency line yesterday, complaining of sinus pressure and migraine, zpak started. Migraine had tapered off after being seen at [facility], given shot of toradal at time. Has seen neurology with little improvement in migraines. Pain on right side of head, throbbing in nature, nausea associated, photophobia. Also complains of sinus pressure and drainage, sore trhoat, body aches, right side pain. +cough with no production Requested to nurse for U preg, flu, strep to be done Works at [employer] she is stressed at work- and at home, her 37 y.o. Left the house recently  Recently ended menses   Review of Systems  GEN- + fatigue,denies  fever, weight loss,weakness, recent illness HEENT- denies eye drainage, change in vision,+ nasal discharge, CVS- denies chest pain, palpitations RESP- denies SOB, +cough, wheeze ABD- + N/ denies V, +change in stools- diarrhea x 1 day, abd pain GU- denies dysuria, hematuria, dribbling, incontinence MSK- denies joint pain, +muscle aches, injury Neuro- + headache, dizziness, syncope, seizure activity      Objective:   Physical Exam GEN- NAD, alert and oriented x3 HEENT- PERRL, EOMI, non injected sclera, pink conjunctiva, MMM, oropharynx mild injection, TM clear bilat no effusion,  + maxillary sinus tenderness, inflammed turbinates,  Nasal drainage  Neck- Supple, no LAD CVS- RRR, no murmur RESP-CTAB ABD-NABS,soft,NT,ND, no CVA tenderness EXT- No edema UA neg, Urine Preg- neg, Flu neg, Strep- positive Pulses- Radial 2+          Assessment & Plan:

## 2013-09-01 NOTE — Assessment & Plan Note (Signed)
PCN allergy, already on zpak, will continue Ibuprofen for pain, push fluids

## 2013-09-01 NOTE — Assessment & Plan Note (Signed)
Only 1-2 RBC seen, recent menses, no protein noted

## 2013-09-01 NOTE — Patient Instructions (Signed)
Continue antibiotics Push fluids and rest Take ibuprofen, you can take the ultram for severe pain if needed Toradol and phenergan [facility]

## 2013-09-01 NOTE — Assessment & Plan Note (Signed)
Recurrent migraines, followed by neurology, has failed multiple meds, worsened in setting of strep and viral illness Given toradol and phenergan in office

## 2013-10-03 ENCOUNTER — Telehealth: Payer: Self-pay | Admitting: Family Medicine

## 2013-10-03 NOTE — Telephone Encounter (Signed)
Was seen urgent care on Monday for sinusitis.  Still with sinus headache.  On last day of Z-pak.  Told needs something to break up congestion. Go pharmacy get OTC decongestant, take as directed.  Drink plenty of fluids.  Finish Z-pak from Urgent Care

## 2013-10-28 ENCOUNTER — Encounter: Payer: Self-pay | Admitting: Family Medicine

## 2013-10-28 ENCOUNTER — Ambulatory Visit (INDEPENDENT_AMBULATORY_CARE_PROVIDER_SITE_OTHER): Payer: Medicaid Other | Admitting: Family Medicine

## 2013-10-28 VITALS — BP 110/70 | HR 80 | Temp 97.1°F | Resp 14 | Wt 130.0 lb

## 2013-10-28 DIAGNOSIS — J329 Chronic sinusitis, unspecified: Secondary | ICD-10-CM

## 2013-10-28 MED ORDER — CEFDINIR 300 MG PO CAPS: 300.0000 mg | ORAL_CAPSULE | Freq: Two times a day (BID) | ORAL | Status: DC

## 2013-10-28 MED ORDER — METHYLPREDNISOLONE (PAK) 4 MG PO TABS: ORAL_TABLET | ORAL | Status: DC
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
## 2013-11-03 ENCOUNTER — Ambulatory Visit: Payer: Medicaid Other | Admitting: Family Medicine

## 2013-11-07 ENCOUNTER — Encounter: Payer: Self-pay | Admitting: Family Medicine

## 2013-11-07 ENCOUNTER — Ambulatory Visit (INDEPENDENT_AMBULATORY_CARE_PROVIDER_SITE_OTHER): Payer: Medicaid Other | Admitting: Family Medicine

## 2013-11-07 VITALS — BP 110/72 | HR 72 | Temp 97.3°F | Resp 18 | Ht 64.0 in | Wt 130.0 lb

## 2013-11-07 DIAGNOSIS — G43909 Migraine, unspecified, not intractable, without status migrainosus: Secondary | ICD-10-CM

## 2013-11-07 DIAGNOSIS — J329 Chronic sinusitis, unspecified: Secondary | ICD-10-CM

## 2013-11-07 MED ORDER — FLUCONAZOLE 150 MG PO TABS: 150.0000 mg | ORAL_TABLET | Freq: Once | ORAL | Status: DC

## 2013-11-07 MED ORDER — LEVONORGEST-ETH ESTRAD 91-DAY 0.15-0.03 MG PO TABS: 1.0000 | ORAL_TABLET | Freq: Every day | ORAL | Status: DC

## 2013-11-07 MED ORDER — OLOPATADINE HCL 0.6 % NA SOLN: 2.0000 | Freq: Two times a day (BID) | NASAL | Status: DC
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
## 2013-11-12 ENCOUNTER — Telehealth: Payer: Self-pay | Admitting: Family Medicine

## 2013-11-12 MED ORDER — CLOTRIMAZOLE 2 % VA CREA: 1.0000 | TOPICAL_CREAM | Freq: Every day | VAGINAL | Status: DC

## 2013-11-12 NOTE — Telephone Encounter (Signed)
Patient aware.

## 2013-11-12 NOTE — Telephone Encounter (Signed)
Have her apply the vaginal clotrimazole suppositories, she can also use a dab on her finger to rub externally on the labia twice a day Avoid intercourse, also eat yogurt with live cultures once a day for the next week

## 2013-11-12 NOTE — Telephone Encounter (Signed)
Pt was given diflucan 11/07/13 for yeast infection s/p antibiotic therapy. She took it on 11/09/13 and now it is worst. Pt states that her vagina is red, burning, raw and itching. She would like to know if there is anything else we can call in for her???

## 2013-11-17 ENCOUNTER — Ambulatory Visit (INDEPENDENT_AMBULATORY_CARE_PROVIDER_SITE_OTHER): Payer: Medicaid Other | Admitting: Family Medicine

## 2013-11-17 ENCOUNTER — Encounter: Payer: Self-pay | Admitting: Family Medicine

## 2013-11-17 VITALS — BP 110/72 | HR 68 | Temp 97.4°F | Resp 18 | Wt 130.0 lb

## 2013-11-17 DIAGNOSIS — N760 Acute vaginitis: Secondary | ICD-10-CM

## 2013-11-17 DIAGNOSIS — N949 Unspecified condition associated with female genital organs and menstrual cycle: Secondary | ICD-10-CM

## 2013-11-17 DIAGNOSIS — N390 Urinary tract infection, site not specified: Secondary | ICD-10-CM

## 2013-11-17 LAB — WET PREP FOR TRICH, YEAST, CLUE
Trich, Wet Prep: NONE SEEN
Yeast Wet Prep HPF POC: NONE SEEN

## 2013-11-17 LAB — URINALYSIS, ROUTINE W REFLEX MICROSCOPIC
Bilirubin Urine: NEGATIVE
Ketones, ur: NEGATIVE mg/dL
Nitrite: NEGATIVE
Specific Gravity, Urine: >1.03 — ABNORMAL HIGH (ref 1.005–1.030)
Urobilinogen, UA: 0.2 mg/dL (ref 0.0–1.0)
pH: 5.5 (ref 5.0–8.0)

## 2013-11-17 LAB — URINALYSIS, MICROSCOPIC ONLY: Crystals: NONE SEEN

## 2013-11-17 LAB — PREGNANCY, URINE: Preg Test, Ur: NEGATIVE

## 2013-11-17 MED ORDER — METRONIDAZOLE 500 MG PO TABS: 500.0000 mg | ORAL_TABLET | Freq: Two times a day (BID) | ORAL | Status: DC

## 2013-11-17 NOTE — Addendum Note (Signed)
Addended by: Reginia Forts on: 11/17/2013 11:29 AM   Modules accepted: Orders
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
## 2013-11-18 LAB — URINE CULTURE: Colony Count: 4000

## 2013-12-11 ENCOUNTER — Encounter: Payer: Self-pay | Admitting: Family Medicine

## 2013-12-11 ENCOUNTER — Telehealth: Payer: Self-pay | Admitting: *Deleted

## 2013-12-11 NOTE — Telephone Encounter (Signed)
Pt is aware and order will be set up for CT

## 2013-12-11 NOTE — Telephone Encounter (Signed)
Pt has another sinusitis, headache and congestion states was here a couple weeks ago and was given antibiotic for sinusitits, wants to know if you can give her something stronger or refer to a specialist.

## 2013-12-11 NOTE — Telephone Encounter (Signed)
Recommend ct of sinuses to see the extent of her sinusitis prior to ent referral.

## 2013-12-17 ENCOUNTER — Other Ambulatory Visit: Payer: Self-pay | Admitting: Family Medicine

## 2013-12-17 DIAGNOSIS — J329 Chronic sinusitis, unspecified: Secondary | ICD-10-CM

## 2013-12-17 NOTE — Telephone Encounter (Signed)
Order has been placed.

## 2013-12-19 ENCOUNTER — Telehealth: Payer: Self-pay | Admitting: Family Medicine

## 2013-12-19 NOTE — Telephone Encounter (Signed)
Pharmacy [facility] [name] is calling today about her Nasal Spray and she was told at the pharmacy that we would have to talk to the insurance company before she can get her prescription and she uses it everyday to help her breathe and she is really wanting to talk to you about this  Call back number is [phone]

## 2013-12-24 NOTE — Telephone Encounter (Signed)
Sent PA through [email or web address]

## 2013-12-25 ENCOUNTER — Encounter: Payer: Self-pay | Admitting: Family Medicine

## 2013-12-25 ENCOUNTER — Ambulatory Visit (INDEPENDENT_AMBULATORY_CARE_PROVIDER_SITE_OTHER): Payer: Medicaid Other | Admitting: Family Medicine

## 2013-12-25 VITALS — BP 100/68 | HR 76 | Temp 97.6°F | Resp 16 | Ht 64.0 in | Wt 128.0 lb

## 2013-12-25 DIAGNOSIS — J329 Chronic sinusitis, unspecified: Secondary | ICD-10-CM

## 2013-12-25 MED ORDER — IBUPROFEN 800 MG PO TABS: 800.0000 mg | ORAL_TABLET | Freq: Every day | ORAL | Status: DC | PRN

## 2013-12-25 MED ORDER — CEFDINIR 300 MG PO CAPS: 300.0000 mg | ORAL_CAPSULE | Freq: Two times a day (BID) | ORAL | Status: DC
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
## 2013-12-31 NOTE — Telephone Encounter (Signed)
Spoke to [employer] per them do not need PA - pharmacy needs to run Brand Name Only and it will be covered.  Called [facility] in [place] and made then aware and the RX went through.  Called pt and made aware that medication has been approved.

## 2014-01-08 ENCOUNTER — Ambulatory Visit (INDEPENDENT_AMBULATORY_CARE_PROVIDER_SITE_OTHER): Payer: Medicaid Other | Admitting: Otolaryngology

## 2014-01-08 DIAGNOSIS — J343 Hypertrophy of nasal turbinates: Secondary | ICD-10-CM

## 2014-01-08 DIAGNOSIS — J342 Deviated nasal septum: Secondary | ICD-10-CM

## 2014-01-13 ENCOUNTER — Encounter: Payer: Self-pay | Admitting: Family Medicine

## 2014-01-13 ENCOUNTER — Ambulatory Visit (INDEPENDENT_AMBULATORY_CARE_PROVIDER_SITE_OTHER): Payer: Medicaid Other | Admitting: Family Medicine

## 2014-01-13 VITALS — BP 110/60 | HR 80 | Temp 98.2°F | Resp 18 | Ht 64.0 in | Wt 129.0 lb

## 2014-01-13 DIAGNOSIS — G43909 Migraine, unspecified, not intractable, without status migrainosus: Secondary | ICD-10-CM

## 2014-01-13 DIAGNOSIS — Z789 Other specified health status: Secondary | ICD-10-CM

## 2014-01-13 LAB — PREGNANCY, URINE: PREG TEST UR: NEGATIVE

## 2014-01-13 MED ORDER — PROMETHAZINE HCL 25 MG/ML IJ SOLN
25.0000 mg | Freq: Once | INTRAMUSCULAR | Status: AC
  Administered 2014-01-13: 25 mg via INTRAMUSCULAR

## 2014-01-13 MED ORDER — KETOROLAC TROMETHAMINE 60 MG/2ML IM SOLN
60.0000 mg | Freq: Once | INTRAMUSCULAR | Status: AC
  Administered 2014-01-13: 60 mg via INTRAMUSCULAR

## 2014-01-13 NOTE — Patient Instructions (Addendum)
Toradol given and phenergan Patanase and nasal saline for the sinuses F/U as needed

## 2014-01-13 NOTE — Progress Notes (Signed)
Patient ID: Ashley [name], female   DOB: [birth year], 38 y.o.   MRN: [id number]   Subjective:    Patient ID: Ashley [name], female    DOB: January 28, 1976, 38 y.o.   MRN: [id number]  Patient presents for Migraine and Nausea  Patient here with history of migraine disorder she was started on new nasal spray for her sinuses by ENT and she thinks that this set off her migraines. She's had a migraine for the past couple of days. She did try her tramadol last night with little improvement typically she comes in for shots and this helps her migraines.  She also requests a urine pregnancy test she's currently on Seasonale however she missed 2 days about a month ago and began to have some spotting when she went back to restart the medication she still taking her birth control and is on the last pack. She has had some nausea therefore was concerned about pregnancy   Review Of Systems:  GEN- denies fatigue, fever, weight loss,weakness, recent illness HEENT- denies eye drainage, change in vision, nasal discharge, CVS- denies chest pain, palpitations RESP- denies SOB, cough, wheeze ABD- + N/ denies V, change in stools, abd pain Neuro- + headache, dizziness, syncope, seizure activity       Objective:    BP 110/60  Pulse 80  Temp(Src) 98.2 F (36.8 C) (Oral)  Resp 18  Ht 5\' 4"  (1.626 m)  Wt 129 lb (58.514 kg)  BMI 22.13 kg/m2 GEN- NAD, alert and oriented x3 HEENT- PERRL, EOMI, non injected sclera, pink conjunctiva, MMM, oropharynx clear, fundus benign Neck- Supple, no LAD ABD-NABS,soft,NT,ND NEURO-CNII-XII in tact no deficits        Assessment & Plan:      Problem List Items Addressed This Visit   None    Visit Diagnoses   Migraine, unspecified, without mention of intractable migraine without mention of status migrainosus    -  Primary    Treat with phenergan,toradol in office, continue home meds, ultram and phenergan, no red flags    Relevant Medications       traMADol (ULTRAM) 50  MG tablet       ketorolac (TORADOL) injection 60 mg (Completed)       promethazine (PHENERGAN) injection 25 mg (Completed)    Not currently pregnant        Upreg negative, on OCP    Relevant Orders       Pregnancy, urine (Completed)       Note: This dictation was prepared with Dragon dictation along with smaller phrase technology. Any transcriptional errors that result from this process are unintentional.

## 2014-02-02 ENCOUNTER — Telehealth: Payer: Self-pay | Admitting: *Deleted

## 2014-02-02 DIAGNOSIS — J329 Chronic sinusitis, unspecified: Secondary | ICD-10-CM

## 2014-02-02 NOTE — Telephone Encounter (Signed)
Pt states wants a second opiniion to ENT, when blows nose blood comes out, also wants something called in for sinus infection. Please advise!

## 2014-02-02 NOTE — Telephone Encounter (Signed)
She saw ENT (Dr. Benjamine Mola)?  She wants to see another?

## 2014-02-03 NOTE — Telephone Encounter (Signed)
LMTRC
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
## 2014-02-05 NOTE — Telephone Encounter (Signed)
I am okay with second opinion.  Epistaxis alone does not warrant an abx.  I recommend afrin bid for 3 days and humidifier next to her bed to see if the nose bleeds will stop.  Also, try not to blow her nose, the trauma will likely exacerbate the nose bleeds.  We could try to cauterize her nose if she would like.

## 2014-02-05 NOTE — Telephone Encounter (Signed)
Spoke to pt and she states that she does not have the money or a ride to the store to pick up afrin or a humidifier right now. i suggested that pt use vaseline in her nose to help moisturize her nose and continue to take motrin for headaches. Will set up another referral for second opinion for ENT. Did inform pt that she needed to be seen if nose bleeds continue. Pt verbalized understanding.

## 2014-02-05 NOTE — Addendum Note (Signed)
Addended by: Shary Decamp B on: 02/05/2014 05:25 PM   Modules accepted: Orders

## 2014-04-01 ENCOUNTER — Ambulatory Visit (INDEPENDENT_AMBULATORY_CARE_PROVIDER_SITE_OTHER): Payer: Medicaid Other | Admitting: Family Medicine

## 2014-04-01 VITALS — BP 128/76 | HR 88 | Temp 97.9°F | Resp 18 | Ht 64.0 in | Wt 127.0 lb

## 2014-04-01 DIAGNOSIS — R51 Headache: Secondary | ICD-10-CM

## 2014-04-01 DIAGNOSIS — G43909 Migraine, unspecified, not intractable, without status migrainosus: Secondary | ICD-10-CM

## 2014-04-01 MED ORDER — TRAMADOL HCL 50 MG PO TABS: 50.0000 mg | ORAL_TABLET | Freq: Four times a day (QID) | ORAL | Status: DC | PRN

## 2014-04-01 MED ORDER — IBUPROFEN 800 MG PO TABS: 800.0000 mg | ORAL_TABLET | Freq: Two times a day (BID) | ORAL | Status: DC | PRN

## 2014-04-01 MED ORDER — PROMETHAZINE HCL 25 MG/ML IJ SOLN: 25.0000 mg | Freq: Four times a day (QID) | INTRAMUSCULAR | Status: DC | PRN

## 2014-04-01 MED ORDER — PROMETHAZINE HCL 25 MG PO TABS: 25.0000 mg | ORAL_TABLET | Freq: Four times a day (QID) | ORAL | Status: DC | PRN

## 2014-04-01 MED ORDER — KETOROLAC TROMETHAMINE 60 MG/2ML IM SOLN
30.0000 mg | Freq: Once | INTRAMUSCULAR | Status: AC
  Administered 2014-04-01: 30 mg via INTRAMUSCULAR

## 2014-04-01 MED ORDER — PROMETHAZINE HCL 25 MG/ML IJ SOLN
25.0000 mg | Freq: Once | INTRAMUSCULAR | Status: AC
  Administered 2014-04-01: 25 mg via INTRAMUSCULAR

## 2014-04-01 NOTE — Assessment & Plan Note (Signed)
Chronic migraines. Advise her followup with her neurologist here we have given abortive treatment in the office with Toradol and Phenergan. She states that she has an allergy to prednisone it causes her heart to race and had trouble breathing therefore would not give this. I have refilled her tramadol 30 tablet as well as ibuprofen 30 tablets and Phenergan 20 tablets.

## 2014-04-01 NOTE — Addendum Note (Signed)
Addended by: Sheral Flow on: 04/01/2014 01:23 PM   Modules accepted: Orders

## 2014-04-01 NOTE — Progress Notes (Signed)
Patient ID: Ashley [name], female   DOB: [birth year], 38 y.o.   MRN: [id number]   Subjective:    Patient ID: Ashley [name], female    DOB: [birth year], 38 y.o.   MRN: [id number]  Patient presents for migraine  Patient here with migraine headache for the past 3 days. She states that she took and tramadol however it was expired. She does not have any Phenergan. She missed her appointment with her neurologist as well as ear nose and throat for second opinion regarding her sinus surgery. She's not sure of the pollen set off her allergies,t she is using her nasal spray for allergies. Her headache isn't her typical and is on no left-sided her head associated with photophobia phonophobia and some mild nausea   Review Of Systems:  GEN- denies fatigue, fever, weight loss,weakness, recent illness HEENT- denies eye drainage, change in vision, nasal discharge, CVS- denies chest pain, palpitations RESP- denies SOB, cough, wheeze Neuro- +headache, dizziness, syncope, seizure activity       Objective:    BP 128/76  Pulse 88  Temp(Src) 97.9 F (36.6 C) (Oral)  Resp 18  Ht 5\' 4"  (1.626 m)  Wt 127 lb (57.607 kg)  BMI 21.79 kg/m2 GEN- NAD, alert and oriented x3 HEENT- PERRL, EOMI, non injected sclera, pink conjunctiva, MMM, oropharynx clear, fundus benign, TM clear bilat, nares clear Neuro- CNII-XII in tact no deficits         Assessment & Plan:      Problem List Items Addressed This Visit   Migraine headache - Primary     Chronic migraines. Advise her followup with her neurologist here we have given abortive treatment in the office with Toradol and Phenergan. She states that she has an allergy to prednisone it causes her heart to race and had trouble breathing therefore would not give this. I have refilled her tramadol 30 tablet as well as ibuprofen 30 tablets and Phenergan 20 tablets.    Relevant Medications      traMADol (ULTRAM) 50 MG tablet      ibuprofen (MOTRIN) tablet      Note: This dictation was prepared with Dragon dictation along with smaller phrase technology. Any transcriptional errors that result from this process are unintentional.

## 2014-04-01 NOTE — Patient Instructions (Signed)
Phenergan and toradol shots given Take ibuprofen with food  Take ultram as needed Please reschedule with your neurologist and ENT doctor  F/U as needed with Dr. Dennard Schaumann

## 2014-04-02 ENCOUNTER — Telehealth: Payer: Self-pay | Admitting: *Deleted

## 2014-04-02 NOTE — Telephone Encounter (Signed)
This was not a sinus infection based on my exam,no antibiotics needed She can continue her regular nasal spray and alternate with nasal saline

## 2014-04-02 NOTE — Telephone Encounter (Signed)
Pt informed of no antibx from wtp and will wait for recommendation from Dr. Buelah Manis. Pt did cxd ent appt from yesterday cause she was sick but did reschedule to 04/21/14.

## 2014-04-02 NOTE — Telephone Encounter (Signed)
I doubt she has a sinus infection

## 2014-04-02 NOTE — Telephone Encounter (Signed)
Received call from patient.   States that migraine continues from 04/01/2014 visit with Dr. Buelah Manis. Patient was given Toradol 30mg  and Promethazine 25mg  IM on 04/01/2014. States that migraine has eased off, but she is now thinking it was started by a sinus infection.   Reports that she has increased nasal congestion and she is now blowing bloody mucus from nose.   Requesting ABTx.   MD please advise.

## 2014-04-03 NOTE — Telephone Encounter (Signed)
Call placed to patient and patient made aware.  

## 2014-05-05 ENCOUNTER — Ambulatory Visit (INDEPENDENT_AMBULATORY_CARE_PROVIDER_SITE_OTHER): Payer: Medicaid Other | Admitting: Family Medicine

## 2014-05-05 ENCOUNTER — Encounter: Payer: Self-pay | Admitting: Family Medicine

## 2014-05-05 VITALS — BP 130/72 | HR 78 | Temp 97.3°F | Resp 16 | Ht 64.0 in | Wt 131.0 lb

## 2014-05-05 DIAGNOSIS — R110 Nausea: Secondary | ICD-10-CM

## 2014-05-05 DIAGNOSIS — J309 Allergic rhinitis, unspecified: Secondary | ICD-10-CM

## 2014-05-05 DIAGNOSIS — R635 Abnormal weight gain: Secondary | ICD-10-CM

## 2014-05-05 LAB — PREGNANCY, URINE: PREG TEST UR: NEGATIVE

## 2014-05-05 MED ORDER — AZELASTINE HCL 0.1 % NA SOLN: 2.0000 | Freq: Two times a day (BID) | NASAL | Status: DC

## 2014-05-05 MED ORDER — ZAFIRLUKAST 10 MG PO TABS: 10.0000 mg | ORAL_TABLET | Freq: Two times a day (BID) | ORAL | Status: DC
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
## 2014-05-14 ENCOUNTER — Telehealth: Payer: Self-pay | Admitting: *Deleted

## 2014-05-14 NOTE — Telephone Encounter (Signed)
Pt is aware to try OTC pregnancy test.

## 2014-05-14 NOTE — Telephone Encounter (Signed)
Try OTC urine pregnancy test.

## 2014-05-14 NOTE — Telephone Encounter (Signed)
Pt called stating she is having spotting"like when she was pregnant", eating more, having weight gain, cramping, and having creamy fluid come out of her breast. She is taking tramadol and Ibuprofen, pt says she was here couple weeks ago and took a pregnancy test (negative), now wants to know if she can come in a take a blood test to see if she is pregnant or what she needs to do.  "

## 2014-05-20 ENCOUNTER — Other Ambulatory Visit: Payer: Medicaid Other

## 2014-05-20 DIAGNOSIS — Z0000 Encounter for general adult medical examination without abnormal findings: Secondary | ICD-10-CM

## 2014-05-20 LAB — CBC WITH DIFFERENTIAL/PLATELET
Basophils Absolute: 0.1 10*3/uL (ref 0.0–0.1)
Basophils Relative: 1 % (ref 0–1)
EOS ABS: 0.1 10*3/uL (ref 0.0–0.7)
Eosinophils Relative: 1 % (ref 0–5)
HCT: 39 % (ref 36.0–46.0)
Hemoglobin: 13.1 g/dL (ref 12.0–15.0)
Lymphocytes Relative: 29 % (ref 12–46)
Lymphs Abs: 1.5 10*3/uL (ref 0.7–4.0)
MCH: 30.5 pg (ref 26.0–34.0)
MCHC: 33.6 g/dL (ref 30.0–36.0)
MCV: 90.9 fL (ref 78.0–100.0)
Monocytes Absolute: 0.2 10*3/uL (ref 0.1–1.0)
Monocytes Relative: 4 % (ref 3–12)
NEUTROS ABS: 3.4 10*3/uL (ref 1.7–7.7)
Neutrophils Relative %: 65 % (ref 43–77)
PLATELETS: 194 10*3/uL (ref 150–400)
RBC: 4.29 MIL/uL (ref 3.87–5.11)
RDW: 12.8 % (ref 11.5–15.5)
WBC: 5.3 10*3/uL (ref 4.0–10.5)

## 2014-05-20 LAB — LIPID PANEL
Cholesterol: 183 mg/dL (ref 0–200)
HDL: 55 mg/dL (ref >39)
LDL CALC: 116 mg/dL — AB (ref 0–99)
TRIGLYCERIDES: 61 mg/dL (ref <150)
Total CHOL/HDL Ratio: 3.3 Ratio
VLDL: 12 mg/dL (ref 0–40)

## 2014-05-20 LAB — COMPREHENSIVE METABOLIC PANEL
ALT: 18 U/L (ref 0–35)
AST: 16 U/L (ref 0–37)
Albumin: 4.2 g/dL (ref 3.5–5.2)
Alkaline Phosphatase: 40 U/L (ref 39–117)
BILIRUBIN TOTAL: 0.4 mg/dL (ref 0.2–1.2)
BUN: 17 mg/dL (ref 6–23)
CO2: 25 mEq/L (ref 19–32)
Calcium: 8.7 mg/dL (ref 8.4–10.5)
Chloride: 104 mEq/L (ref 96–112)
Creat: 0.69 mg/dL (ref 0.50–1.10)
Glucose, Bld: 83 mg/dL (ref 70–99)
Potassium: 4.1 mEq/L (ref 3.5–5.3)
Sodium: 136 mEq/L (ref 135–145)
Total Protein: 6.9 g/dL (ref 6.0–8.3)

## 2014-05-20 LAB — TSH: TSH: 1.014 u[IU]/mL (ref 0.350–4.500)

## 2014-05-22 ENCOUNTER — Other Ambulatory Visit: Payer: Medicaid Other

## 2014-05-26 ENCOUNTER — Ambulatory Visit (INDEPENDENT_AMBULATORY_CARE_PROVIDER_SITE_OTHER): Payer: Medicaid Other | Admitting: Family Medicine

## 2014-05-26 ENCOUNTER — Encounter: Payer: Self-pay | Admitting: Family Medicine

## 2014-05-26 VITALS — BP 100/64 | HR 68 | Temp 96.4°F | Resp 16 | Ht 64.0 in | Wt 131.0 lb

## 2014-05-26 DIAGNOSIS — Z0000 Encounter for general adult medical examination without abnormal findings: Secondary | ICD-10-CM

## 2014-05-26 DIAGNOSIS — N912 Amenorrhea, unspecified: Secondary | ICD-10-CM
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
## 2014-05-27 LAB — HCG, SERUM, QUALITATIVE: PREG SERUM: NEGATIVE

## 2014-05-28 LAB — HPV TYPE 16/18
HPV GENOTYPE, 16: NOT DETECTED
HPV GENOTYPE, 18: NOT DETECTED

## 2014-05-28 LAB — PAP, THIN PREP W/HPV RFLX HPV TYPE 16/18: HPV DNA High Risk: DETECTED — AB

## 2014-08-17 ENCOUNTER — Encounter: Payer: Self-pay | Admitting: Family Medicine

## 2014-08-17 ENCOUNTER — Ambulatory Visit (INDEPENDENT_AMBULATORY_CARE_PROVIDER_SITE_OTHER): Payer: Medicaid Other | Admitting: Family Medicine

## 2014-08-17 VITALS — BP 118/68 | HR 76 | Temp 98.1°F | Resp 18 | Ht 64.0 in | Wt 131.0 lb

## 2014-08-17 DIAGNOSIS — N3000 Acute cystitis without hematuria: Secondary | ICD-10-CM

## 2014-08-17 DIAGNOSIS — R300 Dysuria: Secondary | ICD-10-CM

## 2014-08-17 LAB — URINALYSIS, ROUTINE W REFLEX MICROSCOPIC
BILIRUBIN URINE: NEGATIVE
Glucose, UA: NEGATIVE mg/dL
Ketones, ur: NEGATIVE mg/dL
NITRITE: POSITIVE — AB
PROTEIN: 100 mg/dL — AB
UROBILINOGEN UA: 1 mg/dL (ref 0.0–1.0)
pH: 6 (ref 5.0–8.0)

## 2014-08-17 LAB — URINALYSIS, MICROSCOPIC ONLY
Casts: NONE SEEN
Crystals: NONE SEEN

## 2014-08-17 MED ORDER — CIPROFLOXACIN HCL 500 MG PO TABS: 500.0000 mg | ORAL_TABLET | Freq: Two times a day (BID) | ORAL | Status: DC

## 2014-08-17 MED ORDER — FLUCONAZOLE 150 MG PO TABS: 150.0000 mg | ORAL_TABLET | Freq: Once | ORAL | Status: DC
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
## 2014-08-18 ENCOUNTER — Encounter: Payer: Self-pay | Admitting: Family Medicine

## 2014-08-18 ENCOUNTER — Telehealth: Payer: Self-pay | Admitting: Family Medicine

## 2014-08-18 ENCOUNTER — Ambulatory Visit (INDEPENDENT_AMBULATORY_CARE_PROVIDER_SITE_OTHER): Payer: Medicaid Other | Admitting: Family Medicine

## 2014-08-18 ENCOUNTER — Telehealth: Payer: Self-pay | Admitting: *Deleted

## 2014-08-18 VITALS — BP 117/74 | HR 88 | Temp 98.1°F | Resp 16 | Wt 133.0 lb

## 2014-08-18 DIAGNOSIS — T7840XA Allergy, unspecified, initial encounter: Secondary | ICD-10-CM

## 2014-08-18 DIAGNOSIS — N3000 Acute cystitis without hematuria: Secondary | ICD-10-CM

## 2014-08-18 MED ORDER — PHENAZOPYRIDINE HCL 100 MG PO TABS: 100.0000 mg | ORAL_TABLET | Freq: Three times a day (TID) | ORAL | Status: DC | PRN

## 2014-08-18 MED ORDER — HYDROXYZINE HCL 10 MG PO TABS: 10.0000 mg | ORAL_TABLET | Freq: Three times a day (TID) | ORAL | Status: DC | PRN

## 2014-08-18 MED ORDER — NITROFURANTOIN MONOHYD MACRO 100 MG PO CAPS: 100.0000 mg | ORAL_CAPSULE | Freq: Two times a day (BID) | ORAL | Status: DC

## 2014-08-18 NOTE — Telephone Encounter (Signed)
Pt's concerns answered and if discharge gets worse or accompanied by abd pain then she NTBS

## 2014-08-18 NOTE — Telephone Encounter (Signed)
Pt called stating she is having a allergic reaction to her antibiotic that was given to her at office visit yesterday, pt is having SOB,hives on her stomach and under arms. Pt could not remember the name of antibiotic she was driving. Pt is coming in to see [facility] today.

## 2014-08-18 NOTE — Telephone Encounter (Signed)
Patient is calling to ask some questions about the antibiotic dr pickard prescribed for her yesterday and having some discharge  [phone]

## 2014-08-18 NOTE — Progress Notes (Signed)
Patient ID: Ashley [name], female   DOB: February 04, 1976, 38 y.o.   MRN: [id number]   Subjective:    Patient ID: Ashley [name], female    DOB: 08-24-1976, 38 y.o.   MRN: [id number]  Patient presents for Possible Allergic Reaction  patient here with concern for allergic reaction to Cipro. She was started on Cipro yesterday for urinary tract infection. She's taken 3 tablets she did not notice any itching or rash until this morning after she took a dose she is itching and redness beneath both axilla and across her abdomen. She then states that she thought she was having shortness of breath but she's been under a lot of stress recently as she was doing a lot this morning trying to get her daughter together his she had a job interview and she was supposed to pick up her father from the hospital she thinks this made it made her a little anxious and said actually been short of breath. She has a long allergy list and is allergic to both Benadryl and prednisone they both cause severe tachycardia per report.    Review Of Systems:  GEN- denies fatigue, fever, weight loss,weakness, recent illness HEENT- denies eye drainage, change in vision, nasal discharge, CVS- denies chest pain, palpitations RESP-+ SOB,  Denies cough, wheeze ABD- denies N/V, change in stools, abd pain Neuro- denies headache, dizziness, syncope, seizure activity       Objective:    BP 117/74  Pulse 88  Temp(Src) 98.1 F (36.7 C) (Oral)  Resp 16  Wt 133 lb (60.328 kg)  SpO2 93% GEN- NAD, alert and oriented x3 HEENT- PERRL, EOMI, non injected sclera, pink conjunctiva, MMM, oropharynx clear, uvula midline CVS- RRR, no murmur RESP-CTAB ABD- NABS-soft, mild TTP suprapubic region, no CVA tenderness Skin- erythematous fine maculoapapular rash on abdomen, and bilat axilla, no excoriations EXT- No edema Pulses- Radial 2+        Assessment & Plan:      Problem List Items Addressed This Visit   None    Visit Diagnoses   Acute cystitis without hematuria    -  Primary    We'll discontinue the Cipro place her on Macrobid twice a day have also given her Pyridium for the discomfort    Allergic reaction, initial encounter        Possible and to the ciprofloxacin therefore will discontinue this. She cannot take Benadryl or prednisone I've given her Vistaril she has some high anxiety which is noted, I see no respiratory compromise       Note: This dictation was prepared with Dragon dictation along with smaller phrase technology. Any transcriptional errors that result from this process are unintentional.

## 2014-08-18 NOTE — Patient Instructions (Signed)
Stop the Cipro Start Macrobid antibiotic on Friday  Take the vistaril for itching  Take the pyridium ( prescription AZO for the bladder infection) F/U as needed

## 2014-08-19 LAB — URINE CULTURE

## 2014-09-08 ENCOUNTER — Ambulatory Visit (INDEPENDENT_AMBULATORY_CARE_PROVIDER_SITE_OTHER): Payer: Medicaid Other | Admitting: Family Medicine

## 2014-09-08 ENCOUNTER — Encounter: Payer: Self-pay | Admitting: Family Medicine

## 2014-09-08 VITALS — BP 100/68 | HR 80 | Temp 98.1°F | Resp 16 | Ht 64.0 in | Wt 136.0 lb

## 2014-09-08 DIAGNOSIS — B001 Herpesviral vesicular dermatitis: Secondary | ICD-10-CM

## 2014-09-08 MED ORDER — VALACYCLOVIR HCL 1 G PO TABS: 2000.0000 mg | ORAL_TABLET | Freq: Two times a day (BID) | ORAL | Status: DC
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
## 2014-10-05 ENCOUNTER — Encounter: Payer: Self-pay | Admitting: Family Medicine

## 2014-10-06 ENCOUNTER — Telehealth: Payer: Self-pay | Admitting: Family Medicine

## 2014-10-07 ENCOUNTER — Ambulatory Visit: Payer: Medicaid Other | Admitting: Family Medicine

## 2014-10-07 ENCOUNTER — Ambulatory Visit (INDEPENDENT_AMBULATORY_CARE_PROVIDER_SITE_OTHER): Payer: Medicaid Other | Admitting: Family Medicine

## 2014-10-07 ENCOUNTER — Ambulatory Visit: Payer: Medicaid Other | Admitting: Physician Assistant

## 2014-10-07 ENCOUNTER — Encounter: Payer: Self-pay | Admitting: Family Medicine

## 2014-10-07 VITALS — BP 128/74 | HR 78 | Temp 98.3°F | Resp 14 | Ht 64.0 in | Wt 130.0 lb

## 2014-10-07 DIAGNOSIS — J020 Streptococcal pharyngitis: Secondary | ICD-10-CM

## 2014-10-07 LAB — RAPID STREP SCREEN (MED CTR MEBANE ONLY): STREPTOCOCCUS, GROUP A SCREEN (DIRECT): POSITIVE — AB

## 2014-10-07 MED ORDER — CEFDINIR 300 MG PO CAPS: 300.0000 mg | ORAL_CAPSULE | Freq: Two times a day (BID) | ORAL | Status: DC

## 2014-10-07 NOTE — Progress Notes (Signed)
Patient ID: Ashley [name], female   DOB: 1976/06/24, 38 y.o.   MRN: [id number]   Subjective:    Patient ID: Ashley [name], female    DOB: 1976-05-17, 38 y.o.   MRN: [id number]  Patient presents for Illness patient here with sore throat headache sinus pressure for the past 3 days. She's also had low-grade fever. She has not had any significant cough. She states that her headache does not feel like her typical migraine though will not go away and she takes ibuprofen. She was sent to a neurologist for her recurrent migraines and was told to start Topamax however she has not started this yet worried about side effects but advised her after this infection clears that she needs to start Topamax- she misses significant days of work due to migraines    Review Of Systems:  GEN- denies fatigue, fever, weight loss,weakness, recent illness HEENT- denies eye drainage, change in vision, nasal discharge, CVS- denies chest pain, palpitations RESP- denies SOB, cough, wheeze ABD- denies N/V, change in stools, abd pain Neuro- + headache, dizziness, syncope, seizure activity       Objective:    BP 128/74 mmHg  Pulse 78  Temp(Src) 98.3 F (36.8 C) (Oral)  Resp 14  Ht 5\' 4"  (1.626 m)  Wt 130 lb (58.968 kg)  BMI 22.30 kg/m2  LMP 07/07/2014 (Approximate) GEN- NAD, alert and oriented x3 HEENT- PERRL, EOMI, non injected sclera, pink conjunctiva, MMM, oropharynx +injection, TM clear bilat no effusion,  + maxillary sinus tenderness, + clear Nasal drainage  Neck- Supple, shotty  LAD CVS- RRR, no murmur RESP-CTAB EXT- No edema Pulses- Radial 2+          Assessment & Plan:      Problem List Items Addressed This Visit    None    Visit Diagnoses    Acute pharyngitis, unspecified pharyngitis type    -  Primary    Relevant Orders       Rapid Strep Screen (Completed)       Note: This dictation was prepared with Dragon dictation along with smaller phrase technology. Any transcriptional errors  that result from this process are unintentional.

## 2014-10-07 NOTE — Patient Instructions (Signed)
Take the antibiotics Take ibuprofen three times a day with food  Strep Throat Strep throat is an infection of the throat caused by a bacteria named Streptococcus pyogenes. Your health care provider may call the infection streptococcal "tonsillitis" or "pharyngitis" depending on whether there are signs of inflammation in the tonsils or back of the throat. Strep throat is most common in children aged 38-15 years during the cold months of the year, but it can occur in people of any age during any season. This infection is spread from person to person (contagious) through coughing, sneezing, or other close contact. SIGNS AND SYMPTOMS   Fever or chills.  Painful, swollen, red tonsils or throat.  Pain or difficulty when swallowing.  White or yellow spots on the tonsils or throat.  Swollen, tender lymph nodes or "glands" of the neck or under the jaw.  Red rash all over the body (rare). DIAGNOSIS  Many different infections can cause the same symptoms. A test must be done to confirm the diagnosis so the right treatment can be given. A "rapid strep test" can help your health care provider make the diagnosis in a few minutes. If this test is not available, a light swab of the infected area can be used for a throat culture test. If a throat culture test is done, results are usually available in a day or two. TREATMENT  Strep throat is treated with antibiotic medicine. HOME CARE INSTRUCTIONS   Gargle with 1 tsp of salt in 1 cup of warm water, 3-4 times per day or as needed for comfort.  Family members who also have a sore throat or fever should be tested for strep throat and treated with antibiotics if they have the strep infection.  Make sure everyone in your household washes their hands well.  Do not share food, drinking cups, or personal items that could cause the infection to spread to others.  You may need to eat a soft food diet until your sore throat gets better.  Drink enough water and  fluids to keep your urine clear or pale yellow. This will help prevent dehydration.  Get plenty of rest.  Stay home from school, day care, or work until you have been on antibiotics for 24 hours.  Take medicines only as directed by your health care provider.  Take your antibiotic medicine as directed by your health care provider. Finish it even if you start to feel better. SEEK MEDICAL CARE IF:   The glands in your neck continue to enlarge.  You develop a rash, cough, or earache.  You cough up green, yellow-brown, or bloody sputum.  You have pain or discomfort not controlled by medicines.  Your problems seem to be getting worse rather than better.  You have a fever. SEEK IMMEDIATE MEDICAL CARE IF:   You develop any new symptoms such as vomiting, severe headache, stiff or painful neck, chest pain, shortness of breath, or trouble swallowing.  You develop severe throat pain, drooling, or changes in your voice.  You develop swelling of the neck, or the skin on the neck becomes red and tender.  You develop signs of dehydration, such as fatigue, dry mouth, and decreased urination.  You become increasingly sleepy, or you cannot wake up completely. MAKE SURE YOU:  Understand these instructions.  Will watch your condition.  Will get help right away if you are not doing well or get worse. Document Released: 11/17/2000 Document Revised: 04/06/2014 Document Reviewed: 01/19/2011 [facility] Patient Information 2015 [place], [place].  This information is not intended to replace advice given to you by your health care provider. Make sure you discuss any questions you have with your health care provider.

## 2014-10-21 ENCOUNTER — Ambulatory Visit (INDEPENDENT_AMBULATORY_CARE_PROVIDER_SITE_OTHER): Payer: Medicaid Other | Admitting: Family Medicine

## 2014-10-21 ENCOUNTER — Encounter: Payer: Self-pay | Admitting: Family Medicine

## 2014-10-21 ENCOUNTER — Ambulatory Visit: Payer: Medicaid Other | Admitting: Family Medicine

## 2014-10-21 VITALS — BP 122/60 | HR 56 | Temp 98.4°F | Resp 14 | Ht 63.0 in | Wt 137.0 lb

## 2014-10-21 DIAGNOSIS — B349 Viral infection, unspecified: Secondary | ICD-10-CM

## 2014-10-21 DIAGNOSIS — J029 Acute pharyngitis, unspecified: Secondary | ICD-10-CM

## 2014-10-21 MED ORDER — IBUPROFEN 800 MG PO TABS: 800.0000 mg | ORAL_TABLET | Freq: Two times a day (BID) | ORAL | Status: DC | PRN

## 2014-10-21 MED ORDER — PROMETHAZINE HCL 25 MG PO TABS: 25.0000 mg | ORAL_TABLET | Freq: Four times a day (QID) | ORAL | Status: DC | PRN

## 2014-10-21 NOTE — Patient Instructions (Addendum)
Take phenergan  Go ahead and start the topamax Plenty of fluds  Use Cepacol  F/U as needed

## 2014-10-21 NOTE — Progress Notes (Signed)
Patient ID: Ashley [name], female   DOB: 07/10/1976, 38 y.o.   MRN: [id number]   Subjective:    Patient ID: Ashley [name], female    DOB: July 03, 1976, 38 y.o.   MRN: [id number]  Patient presents for Illness patient here with ongoing sore throat as well as cough with some production upset stomach states that she had some vomiting yesterday was sent home from her daycare. She was treated for a positive strep a week or so ago she completed her antibiotics. She also has ongoing migraine but she has not started the Topamax she's been taking Toradol or ibuprofen as needed along with Phenergan. No when she had the nausea and vomiting she did not use any Phenergan    Review Of Systems: per above  GEN- denies fatigue, fever, weight loss,weakness, recent illness HEENT- denies eye drainage, change in vision, nasal discharge, CVS- denies chest pain, palpitations RESP- denies SOB, +cough, wheeze ABD- + N/V, change in stools, abd pain GU- denies dysuria, hematuria, dribbling, incontinence MSK- denies joint pain, muscle aches, injury Neuro- + headache, dizziness, syncope, seizure activity       Objective:    BP 122/60 mmHg  Pulse 56  Temp(Src) 98.4 F (36.9 C) (Oral)  Resp 14  Ht 5\' 3"  (1.6 m)  Wt 137 lb (62.143 kg)  BMI 24.27 kg/m2  LMP 07/07/2014 (Approximate) GEN- NAD, alert and oriented x3 HEENT- PERRL, EOMI, non injected sclera, pink conjunctiva, MMM, oropharynx injected, no exudates Neck- Supple, no LAD CVS- RRR, no murmur RESP-CTAB ABD-NABS,soft,NT,ND EXT- No edema PSYCH- Anxious, not depressed   Pulses- Radial 2+        Assessment & Plan:      Problem List Items Addressed This Visit    None    Visit Diagnoses    Acute pharyngitis, unspecified pharyngitis type    -  Primary    send throat culture for persistant symptoms, will not give further antibiotics unless positive., highly anxious about all of meds, what she will and wont take- Cepacol samples from office given,  she has allergy to magic mouthwash ingridients    Relevant Orders       Throat culture ([facility])    Viral illness        Supportive care, she did ask about her OCP, has not missed any days, no spotting, menses due tin 3 weeks, doubt pregnancy, offered [facility] she declined       Note: This dictation was prepared with Dragon dictation along with smaller phrase technology. Any transcriptional errors that result from this process are unintentional.

## 2014-10-23 LAB — CULTURE, GROUP A STREP: ORGANISM ID, BACTERIA: NORMAL

## 2014-11-09 ENCOUNTER — Telehealth: Payer: Self-pay | Admitting: Family Medicine

## 2014-11-09 NOTE — Telephone Encounter (Signed)
[facility] [place]  Patient would like to know if we can call her in ibuprofen if possible

## 2014-11-10 NOTE — Telephone Encounter (Signed)
Ibuprofen 800 mg po q 8 hrs prn pain (90). 0 RF

## 2014-11-11 MED ORDER — IBUPROFEN 800 MG PO TABS: 800.0000 mg | ORAL_TABLET | Freq: Three times a day (TID) | ORAL | Status: DC | PRN

## 2014-11-11 NOTE — Telephone Encounter (Signed)
RX sent

## 2014-11-14 ENCOUNTER — Other Ambulatory Visit: Payer: Self-pay | Admitting: Family Medicine

## 2014-12-29 ENCOUNTER — Encounter: Payer: Self-pay | Admitting: Family Medicine

## 2015-02-01 ENCOUNTER — Encounter: Payer: Self-pay | Admitting: Family Medicine

## 2015-02-01 ENCOUNTER — Ambulatory Visit (INDEPENDENT_AMBULATORY_CARE_PROVIDER_SITE_OTHER): Payer: Medicaid Other | Admitting: Family Medicine

## 2015-02-01 VITALS — BP 100/64 | HR 98 | Temp 98.3°F | Resp 18 | Ht 64.0 in | Wt 128.0 lb

## 2015-02-01 DIAGNOSIS — K219 Gastro-esophageal reflux disease without esophagitis: Secondary | ICD-10-CM

## 2015-02-01 MED ORDER — RANITIDINE HCL 150 MG PO TABS: 150.0000 mg | ORAL_TABLET | Freq: Two times a day (BID) | ORAL | Status: DC

## 2015-02-01 MED ORDER — LEVONORGEST-ETH ESTRAD 91-DAY 0.15-0.03 MG PO TABS: 1.0000 | ORAL_TABLET | Freq: Every day | ORAL | Status: DC

## 2015-02-01 MED ORDER — IBUPROFEN 800 MG PO TABS: 800.0000 mg | ORAL_TABLET | Freq: Three times a day (TID) | ORAL | Status: DC | PRN
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
## 2015-02-01 NOTE — Addendum Note (Signed)
Addended by: Shary Decamp B on: 02/01/2015 10:50 AM   Modules accepted: Orders

## 2015-02-04 ENCOUNTER — Telehealth: Payer: Self-pay | Admitting: Family Medicine

## 2015-02-04 NOTE — Telephone Encounter (Signed)
Patient is calling to see if she can start her "pill" on Sunday, so she wont have a period, please call her at [phone]

## 2015-02-04 NOTE — Telephone Encounter (Signed)
Patient aware of recommendation.  

## 2015-02-04 NOTE — Telephone Encounter (Signed)
If she skips the placebo pills her period will not occur.

## 2015-02-10 ENCOUNTER — Encounter: Payer: Self-pay | Admitting: Family Medicine

## 2015-03-17 ENCOUNTER — Telehealth: Payer: Self-pay | Admitting: Family Medicine

## 2015-03-17 ENCOUNTER — Telehealth: Payer: Self-pay | Admitting: *Deleted

## 2015-03-17 MED ORDER — AZELASTINE HCL 0.1 % NA SOLN: 2.0000 | Freq: Two times a day (BID) | NASAL | Status: DC

## 2015-03-17 MED ORDER — OLOPATADINE HCL 0.6 % NA SOLN: 2.0000 | Freq: Two times a day (BID) | NASAL | Status: DC

## 2015-03-17 NOTE — Telephone Encounter (Signed)
Med sent to pharm 

## 2015-03-17 NOTE — Telephone Encounter (Signed)
yes

## 2015-03-17 NOTE — Telephone Encounter (Signed)
[facility] [place]  Patient is calling to see if dr pickard can call in her nasal spray for allergies if possible, she could not give me the name of it and I did not see nasal spray on her med list, had kim look behind me [phone]

## 2015-03-17 NOTE — Telephone Encounter (Signed)
Prescription sent to pharmacy.

## 2015-03-17 NOTE — Telephone Encounter (Signed)
Received request from pharmacy for PA on Olopatadine nasal spray.   Ok to switch to [facility] preferred [place]?

## 2015-04-08 ENCOUNTER — Telehealth: Payer: Self-pay | Admitting: *Deleted

## 2015-04-08 MED ORDER — IBUPROFEN 800 MG PO TABS: 800.0000 mg | ORAL_TABLET | Freq: Three times a day (TID) | ORAL | Status: DC | PRN

## 2015-04-08 NOTE — Telephone Encounter (Signed)
Pt called stating she is having some back pain and wants to know if you could call her in some Ibuprofen 800mg .  [facility] [place]  Says can leave message on vm

## 2015-04-08 NOTE — Telephone Encounter (Signed)
Script sent to pharmacy and pt aware via vm

## 2015-04-08 NOTE — Telephone Encounter (Signed)
Ok 800 tid prn (30)

## 2015-04-26 ENCOUNTER — Encounter: Payer: Self-pay | Admitting: Family Medicine

## 2015-04-26 ENCOUNTER — Ambulatory Visit (INDEPENDENT_AMBULATORY_CARE_PROVIDER_SITE_OTHER): Payer: Medicaid Other | Admitting: Family Medicine

## 2015-04-26 VITALS — BP 110/78 | HR 82 | Temp 98.0°F | Resp 16 | Ht 64.0 in | Wt 130.0 lb

## 2015-04-26 DIAGNOSIS — H8110 Benign paroxysmal vertigo, unspecified ear: Secondary | ICD-10-CM
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
## 2015-07-02 ENCOUNTER — Telehealth: Payer: Self-pay | Admitting: Family Medicine

## 2015-07-02 MED ORDER — DIAZEPAM 10 MG PO TABS: ORAL_TABLET | ORAL | Status: DC

## 2015-07-02 NOTE — Telephone Encounter (Signed)
Okay with 10 mg tablets, 1/2-1 tab 30 minutes prior to flying. Dispense 10 tablets

## 2015-07-02 NOTE — Telephone Encounter (Signed)
Will be traveling soon.  Asking for Valium for flying.  You have done before.

## 2015-07-02 NOTE — Telephone Encounter (Signed)
RX called in, pt made aware

## 2015-07-20 ENCOUNTER — Encounter: Payer: Self-pay | Admitting: Family Medicine

## 2015-07-20 ENCOUNTER — Ambulatory Visit (INDEPENDENT_AMBULATORY_CARE_PROVIDER_SITE_OTHER): Payer: Medicaid Other | Admitting: Family Medicine

## 2015-07-20 VITALS — BP 110/70 | HR 82 | Temp 98.1°F | Resp 16 | Ht 64.0 in | Wt 128.0 lb

## 2015-07-20 DIAGNOSIS — Z0000 Encounter for general adult medical examination without abnormal findings: Secondary | ICD-10-CM

## 2015-07-20 MED ORDER — OLOPATADINE HCL 0.6 % NA SOLN: 2.0000 | Freq: Every morning | NASAL | Status: DC
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
## 2015-07-22 ENCOUNTER — Other Ambulatory Visit: Payer: Self-pay | Admitting: Family Medicine

## 2015-07-22 DIAGNOSIS — J309 Allergic rhinitis, unspecified: Secondary | ICD-10-CM

## 2015-07-22 LAB — PAP, THIN PREP W/HPV RFLX HPV TYPE 16/18: HPV DNA High Risk: DETECTED — AB

## 2015-07-22 LAB — HPV TYPE 16/18
HPV GENOTYPE, 16: NOT DETECTED
HPV Genotype, 18: NOT DETECTED

## 2015-07-22 MED ORDER — PATANASE 0.6 % NA SOLN: 2.0000 [drp] | NASAL | Status: DC

## 2015-07-22 NOTE — Telephone Encounter (Signed)
Medicaid does not cover this generic medication.  Order changed to name brand Patanase and sent to pharmacy.

## 2015-08-07 ENCOUNTER — Other Ambulatory Visit: Payer: Self-pay | Admitting: Family Medicine

## 2015-08-07 LAB — COMPREHENSIVE METABOLIC PANEL
ALT: 14 U/L (ref 6–29)
AST: 12 U/L (ref 10–30)
Albumin: 4.1 g/dL (ref 3.6–5.1)
Alkaline Phosphatase: 30 U/L — ABNORMAL LOW (ref 33–115)
BILIRUBIN TOTAL: 0.6 mg/dL (ref 0.2–1.2)
BUN: 8 mg/dL (ref 7–25)
CHLORIDE: 105 mmol/L (ref 98–110)
CO2: 25 mmol/L (ref 20–31)
Calcium: 8.6 mg/dL (ref 8.6–10.2)
Creat: 0.78 mg/dL (ref 0.50–1.10)
Glucose, Bld: 89 mg/dL (ref 70–99)
Potassium: 3.7 mmol/L (ref 3.5–5.3)
SODIUM: 138 mmol/L (ref 135–146)
TOTAL PROTEIN: 6.9 g/dL (ref 6.1–8.1)

## 2015-08-07 LAB — LIPID PANEL
CHOL/HDL RATIO: 3.8 ratio (ref <=5.0)
Cholesterol: 192 mg/dL (ref 125–200)
HDL: 50 mg/dL (ref >=46)
LDL CALC: 127 mg/dL (ref <130)
TRIGLYCERIDES: 75 mg/dL (ref <150)
VLDL: 15 mg/dL (ref <30)

## 2015-08-07 LAB — CBC WITH DIFFERENTIAL/PLATELET
BASOS ABS: 0.1 10*3/uL (ref 0.0–0.1)
BASOS PCT: 1 % (ref 0–1)
EOS ABS: 0.1 10*3/uL (ref 0.0–0.7)
Eosinophils Relative: 1 % (ref 0–5)
HCT: 38.7 % (ref 36.0–46.0)
Hemoglobin: 13 g/dL (ref 12.0–15.0)
Lymphocytes Relative: 22 % (ref 12–46)
Lymphs Abs: 1.4 10*3/uL (ref 0.7–4.0)
MCH: 30.7 pg (ref 26.0–34.0)
MCHC: 33.6 g/dL (ref 30.0–36.0)
MCV: 91.5 fL (ref 78.0–100.0)
MPV: 12.3 fL (ref 8.6–12.4)
Monocytes Absolute: 0.4 10*3/uL (ref 0.1–1.0)
Monocytes Relative: 6 % (ref 3–12)
NEUTROS PCT: 70 % (ref 43–77)
Neutro Abs: 4.4 10*3/uL (ref 1.7–7.7)
PLATELETS: 207 10*3/uL (ref 150–400)
RBC: 4.23 MIL/uL (ref 3.87–5.11)
RDW: 13.2 % (ref 11.5–15.5)
WBC: 6.3 10*3/uL (ref 4.0–10.5)

## 2015-08-10 ENCOUNTER — Encounter: Payer: Self-pay | Admitting: Family Medicine

## 2016-01-07 ENCOUNTER — Other Ambulatory Visit: Payer: Self-pay | Admitting: Family Medicine

## 2016-01-10 NOTE — Telephone Encounter (Signed)
Refill appropriate and filled per protocol. 

## 2016-02-14 ENCOUNTER — Encounter: Payer: Self-pay | Admitting: Family Medicine

## 2016-02-14 ENCOUNTER — Ambulatory Visit (INDEPENDENT_AMBULATORY_CARE_PROVIDER_SITE_OTHER): Payer: Medicaid Other | Admitting: Family Medicine

## 2016-02-14 VITALS — BP 100/68 | HR 80 | Temp 98.6°F | Resp 16 | Ht 64.0 in | Wt 134.0 lb

## 2016-02-14 DIAGNOSIS — K219 Gastro-esophageal reflux disease without esophagitis: Secondary | ICD-10-CM

## 2016-02-14 MED ORDER — OMEPRAZOLE 40 MG PO CPDR
40.0000 mg | DELAYED_RELEASE_CAPSULE | Freq: Every day | ORAL | Status: DC
Start: 2016-02-14 — End: 2016-03-07
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
## 2016-03-07 ENCOUNTER — Ambulatory Visit (INDEPENDENT_AMBULATORY_CARE_PROVIDER_SITE_OTHER): Payer: Medicaid Other | Admitting: Family Medicine

## 2016-03-07 ENCOUNTER — Telehealth: Payer: Self-pay | Admitting: Family Medicine

## 2016-03-07 ENCOUNTER — Encounter: Payer: Self-pay | Admitting: Family Medicine

## 2016-03-07 VITALS — BP 116/78 | HR 82 | Temp 98.3°F | Resp 14 | Ht 64.0 in | Wt 133.0 lb

## 2016-03-07 DIAGNOSIS — R300 Dysuria: Secondary | ICD-10-CM

## 2016-03-07 DIAGNOSIS — J029 Acute pharyngitis, unspecified: Secondary | ICD-10-CM | POA: Diagnosis not present

## 2016-03-07 DIAGNOSIS — J039 Acute tonsillitis, unspecified: Secondary | ICD-10-CM | POA: Diagnosis not present

## 2016-03-07 LAB — URINALYSIS, MICROSCOPIC ONLY
CRYSTALS: NONE SEEN [HPF]
YEAST: NONE SEEN [HPF]

## 2016-03-07 LAB — URINALYSIS, ROUTINE W REFLEX MICROSCOPIC
Bilirubin Urine: NEGATIVE
Glucose, UA: NEGATIVE
Leukocytes, UA: NEGATIVE
NITRITE: NEGATIVE
pH: 6 (ref 5.0–8.0)

## 2016-03-07 LAB — STREP GROUP A AG, W/REFLEX TO CULT: STREGTOCOCCUS GROUP A AG SCREEN: NOT DETECTED

## 2016-03-07 MED ORDER — CEFDINIR 300 MG PO CAPS: 300.0000 mg | ORAL_CAPSULE | Freq: Two times a day (BID) | ORAL | Status: DC

## 2016-03-07 MED ORDER — AZITHROMYCIN 250 MG PO TABS: ORAL_TABLET | ORAL | Status: DC

## 2016-03-07 MED ORDER — FLUCONAZOLE 150 MG PO TABS: 150.0000 mg | ORAL_TABLET | Freq: Once | ORAL | Status: DC

## 2016-03-07 NOTE — Progress Notes (Signed)
Patient ID: Ashley [name], female   DOB: [birth year], 40 y.o.   MRN: [id number]   Subjective:    Patient ID: Ashley [name], female    DOB: [birth year], 40 y.o.   MRN: [id number]  Patient presents for Illness and Dysuria Patient with sore throat mild congestion for the past week. Her daughter currently has strep throat. She has not had any fever and cough. She also works at a daycare.  She's had some urinary pressure but denies any burning with urination denies any hematuria denies any vaginal discharge. She has not taken anything over-the-counter    Review Of Systems:  GEN- denies fatigue, fever, weight loss,weakness, recent illness HEENT- denies eye drainage, change in vision, +nasal discharge, CVS- denies chest pain, palpitations RESP- denies SOB, cough, wheeze ABD- denies N/V, change in stools, abd pain Neuro- denies headache, dizziness, syncope, seizure activity       Objective:    BP 116/78 mmHg  Pulse 82  Temp(Src) 98.3 F (36.8 C) (Oral)  Resp 14  Ht 5\' 4"  (1.626 m)  Wt 133 lb (60.328 kg)  BMI 22.82 kg/m2 GEN- NAD, alert and oriented x3 HEENT- PERRL, EOMI, non injected sclera, pink conjunctiva, MMM, oropharynx +injection, swollen tonsils, no exudate, TM clear bilat no effusion, nares clear  Neck- Supple, shotty LAD CVS- RRR, no murmur RESP-CTAB ABD-NABS,soft,NT, ND, no CVA tenderness  EXT- No edema Pulses- Radial 2+         Assessment & Plan:      Problem List Items Addressed This Visit    DYSURIA   Relevant Orders   Urinalysis, Routine w reflex microscopic (not at [facility]) (Completed)    Other Visit Diagnoses    Tonsillopharyngitis    -  Primary    Strep neg , but with history and symptoms, treat with omnicef, no sign of UTI, mucous present, recent sexual activity, given diflucan as well    Relevant Orders    STREP GROUP A AG, W/REFLEX TO CULT (Completed)       Note: This dictation was prepared with Dragon dictation along with smaller phrase  technology. Any transcriptional errors that result from this process are unintentional.

## 2016-03-07 NOTE — Telephone Encounter (Addendum)
Per MD, D/C Omnicef and begin Z Pack.   Prescription sent to pharmacy.   Call placed to patient and patient made aware.

## 2016-03-07 NOTE — Telephone Encounter (Signed)
When patient was at check out she states she doesn't need to go to the pharmacy to look at the cefdinir she knows that is the one that doesn't work for her. She is requesting something else called in she states that is what she always gets from Urgent care and it doesn't work. Please advise.  CB# [phone]

## 2016-03-07 NOTE — Patient Instructions (Signed)
Take antibiotics as prescribed Take diflucan at end of antibiotics F/U as needed

## 2016-03-10 ENCOUNTER — Telehealth: Payer: Self-pay | Admitting: Family Medicine

## 2016-03-10 NOTE — Telephone Encounter (Signed)
Pt called and given provider recommendations 

## 2016-03-10 NOTE — Telephone Encounter (Signed)
She just started antibiotics on 4/4 , she needs to complete them Use salt water gargle She declined taking the omnicef which would have worked better I would not change anything else

## 2016-03-10 NOTE — Telephone Encounter (Signed)
Says not feeling any better.  Throat is worse.  More red and swollen.  There has been no improvement.

## 2016-03-20 ENCOUNTER — Telehealth: Payer: Self-pay | Admitting: Family Medicine

## 2016-03-20 DIAGNOSIS — B001 Herpesviral vesicular dermatitis: Secondary | ICD-10-CM

## 2016-03-20 MED ORDER — VALACYCLOVIR HCL 1 G PO TABS: 2000.0000 mg | ORAL_TABLET | Freq: Two times a day (BID) | ORAL | Status: DC

## 2016-03-20 NOTE — Telephone Encounter (Signed)
Pt woke up with 2 fever blisters on her mouth and is hoping that we can call in a refill of Valtrex to the [facility] in [place]

## 2016-03-20 NOTE — Telephone Encounter (Signed)
?   OK to Refill  

## 2016-03-20 NOTE — Telephone Encounter (Signed)
Valtrex 2 g pobid x 1 day 

## 2016-03-20 NOTE — Telephone Encounter (Signed)
Medication called/sent to requested pharmacy  

## 2016-04-05 ENCOUNTER — Telehealth: Payer: Self-pay | Admitting: Family Medicine

## 2016-04-05 NOTE — Telephone Encounter (Signed)
Pt would like to see if Dr. Dennard Schaumann is ok with her taking something over the counter for weight loss.  Please advise [phone]

## 2016-04-06 NOTE — Telephone Encounter (Signed)
Patient aware of providers recommendations.  

## 2016-04-06 NOTE — Telephone Encounter (Signed)
There is nothing otc that has been shown to work.  She can certainly try.  The otc meds are mainly stimulants that suppress appetite.  However, watch for worsening headaches or palpitations.

## 2016-06-08 ENCOUNTER — Telehealth: Payer: Self-pay | Admitting: *Deleted

## 2016-06-08 MED ORDER — FLUCONAZOLE 150 MG PO TABS: 150.0000 mg | ORAL_TABLET | Freq: Once | ORAL | Status: DC

## 2016-06-08 MED ORDER — SULFAMETHOXAZOLE-TRIMETHOPRIM 800-160 MG PO TABS: 1.0000 | ORAL_TABLET | Freq: Two times a day (BID) | ORAL | Status: DC

## 2016-06-08 NOTE — Telephone Encounter (Signed)
Received return call from patient.   Reports increased pressure and slight hematuria at this time.   Again advised to go to UC for eval, but again patient declined. States that she knows this is UTI and requested ABTx.

## 2016-06-08 NOTE — Telephone Encounter (Signed)
Call placed to patient and patient made aware.   Also sent in Diflucan per patient request for after ABTx.

## 2016-06-08 NOTE — Telephone Encounter (Signed)
Bactrim ds bid for 3 days

## 2016-06-08 NOTE — Telephone Encounter (Signed)
Received call from patient.   Reports that she has x1 day of urinary pressure, frequency, burning with urination, and lower abd cramping.   States that she is currently on vacation in [place]. Requested order for ABTx to be sent to [facility] ([place]).  Advised patient to UC for eval and UA. States that she has no insurance at this time.   MD please advise.   [phone]

## 2016-07-05 ENCOUNTER — Other Ambulatory Visit: Payer: Self-pay | Admitting: Family Medicine

## 2016-07-05 DIAGNOSIS — K219 Gastro-esophageal reflux disease without esophagitis: Secondary | ICD-10-CM

## 2016-07-05 NOTE — Telephone Encounter (Signed)
Refill appropriate and filled per protocol. 

## 2016-08-10 ENCOUNTER — Telehealth: Payer: Self-pay | Admitting: Family Medicine

## 2016-08-10 NOTE — Telephone Encounter (Signed)
Diflucan 150 po x 1

## 2016-08-10 NOTE — Telephone Encounter (Signed)
Just finished antibiotics.  Can she have a Diflucan, starting to get yeast inf.

## 2016-08-11 MED ORDER — FLUCONAZOLE 150 MG PO TABS: 150.0000 mg | ORAL_TABLET | Freq: Once | ORAL | 0 refills | Status: AC

## 2016-08-11 NOTE — Telephone Encounter (Signed)
Rx sent to pharmacy   

## 2016-08-28 ENCOUNTER — Encounter: Payer: Self-pay | Admitting: Family Medicine

## 2016-08-28 ENCOUNTER — Ambulatory Visit (INDEPENDENT_AMBULATORY_CARE_PROVIDER_SITE_OTHER): Payer: Medicaid Other | Admitting: Physician Assistant

## 2016-08-28 VITALS — BP 118/78 | HR 78 | Temp 97.8°F | Resp 16 | Wt 137.0 lb

## 2016-08-28 DIAGNOSIS — J020 Streptococcal pharyngitis: Secondary | ICD-10-CM

## 2016-08-28 DIAGNOSIS — J988 Other specified respiratory disorders: Secondary | ICD-10-CM

## 2016-08-28 DIAGNOSIS — B9689 Other specified bacterial agents as the cause of diseases classified elsewhere: Secondary | ICD-10-CM

## 2016-08-28 LAB — STREP GROUP A AG, W/REFLEX TO CULT: STREGTOCOCCUS GROUP A AG SCREEN: NOT DETECTED

## 2016-08-28 MED ORDER — AZITHROMYCIN 250 MG PO TABS: ORAL_TABLET | ORAL | 0 refills | Status: DC

## 2016-08-28 NOTE — Progress Notes (Signed)
Patient ID: Ashley [name] MRN: [id number], DOB: October 29, 1976, 40 y.o. Date of Encounter: 08/28/2016, 11:32 AM    Chief Complaint:  Chief Complaint  Patient presents with  . Sinusitis    ear pain, throat pain      HPI: 40 y.o. year old female says she has had sinus infections in the past and this feels like her usual sinus headache. In addition to the pressure in her maxillary sinus region bilaterally she is also having yellow mucus out of her nose.Throat feels a little irritated. Reviewed that she has multiple medication allergies including penicillin Levaquin and prednisone. She says that Z-Pak usually works well for her sinus infections.     Home Meds:   Outpatient Medications Prior to Visit  Medication Sig Dispense Refill  . JOLESSA 0.15-0.03 MG tablet TAKE ONE TABLET BY MOUTH ONCE DAILY 91 tablet 0  . omeprazole (PRILOSEC) 40 MG capsule TAKE ONE CAPSULE BY MOUTH ONCE DAILY 30 capsule 0  . propranolol (INDERAL) 10 MG tablet Take 10 mg by mouth at bedtime.    . sulfamethoxazole-trimethoprim (BACTRIM DS,SEPTRA DS) 800-160 MG tablet Take 1 tablet by mouth 2 (two) times daily. 6 tablet 0  . azithromycin (ZITHROMAX) 250 MG tablet Take (2) tablets by mouth on day 1, then take (1) tablet by mouth on days 2-5. (Patient not taking: Reported on 08/28/2016) 6 tablet 0  . ibuprofen (ADVIL,MOTRIN) 800 MG tablet Take 1 tablet (800 mg total) by mouth every 8 (eight) hours as needed. For pain (Patient not taking: Reported on 08/28/2016) 30 tablet 0  . valACYclovir (VALTREX) 1000 MG tablet Take 2 tablets (2,000 mg total) by mouth 2 (two) times daily. X once (Patient not taking: Reported on 08/28/2016) 4 tablet 4   No facility-administered medications prior to visit.     Allergies:  Allergies  Allergen Reactions  . Penicillins Hives and Shortness Of Breath  . Aspirin Hives  . Levaquin [Levofloxacin In D5w] Hives  . Prednisone   . Vicodin [Hydrocodone-Acetaminophen] Nausea And Vomiting  .  Diphenhydramine Hcl Palpitations  . Latex Itching and Rash      Review of Systems: See HPI for pertinent ROS. All other ROS negative.    Physical Exam: Blood pressure 118/78, pulse 78, temperature 97.8 F (36.6 C), resp. rate 16, weight 137 lb (62.1 kg)., Body mass index is 23.52 kg/m. General:  WNWD WF. Appears in no acute distress. HEENT: Normocephalic, atraumatic, eyes without discharge, sclera non-icteric, nares are without discharge. Bilateral auditory canals clear, TM's are without perforation, pearly grey and translucent with reflective cone of light bilaterally. Oral cavity moist, posterior pharynx without exudate, erythema, peritonsillar abscess. Positive tenderness with percussion to maxillary sinuses bilaterally. No tenderness with percussion to frontal sinuses.  Neck: Supple. No thyromegaly. No lymphadenopathy. Lungs: Clear bilaterally to auscultation without wheezes, rales, or rhonchi. Breathing is unlabored. Heart: Regular rhythm. No murmurs, rubs, or gallops. Msk:  Strength and tone normal for age. Extremities/Skin: Warm and dry. Neuro: Alert and oriented X 3. Moves all extremities spontaneously. Gait is normal. CNII-XII grossly in tact. Psych:  Responds to questions appropriately with a normal affect.     ASSESSMENT AND PLAN:  40 y.o. year old female with  1. Bacterial respiratory infection She is to take the Z-Pak as directed. Follow-up if symptoms do not resolve within 1 week after completion of antibiotic. Note given for out of work today with plans to return tomorrow. She says that she really needs to get back to work  tomorrow if possible. - azithromycin (ZITHROMAX) 250 MG tablet; Day 1: Take 2 daily. Days 2-5: Take 1 daily.  Dispense: 6 tablet; Refill: 0  2. Streptococcal sore throat - STREP GROUP A AG, W/REFLEX TO CULT   Signed, [address] [place], [place], [facility] 08/28/2016 11:32 AM

## 2016-08-30 LAB — CULTURE, GROUP A STREP: Organism ID, Bacteria: NORMAL

## 2016-10-02 ENCOUNTER — Ambulatory Visit (INDEPENDENT_AMBULATORY_CARE_PROVIDER_SITE_OTHER): Payer: Self-pay | Admitting: Physician Assistant

## 2016-10-02 ENCOUNTER — Encounter: Payer: Self-pay | Admitting: Physician Assistant

## 2016-10-02 VITALS — BP 110/72 | HR 104 | Temp 97.9°F | Resp 16 | Ht 64.0 in | Wt 134.0 lb

## 2016-10-02 DIAGNOSIS — R52 Pain, unspecified: Secondary | ICD-10-CM

## 2016-10-02 DIAGNOSIS — B349 Viral infection, unspecified: Secondary | ICD-10-CM

## 2016-10-02 DIAGNOSIS — J020 Streptococcal pharyngitis: Secondary | ICD-10-CM

## 2016-10-02 DIAGNOSIS — K219 Gastro-esophageal reflux disease without esophagitis: Secondary | ICD-10-CM

## 2016-10-02 LAB — STREP GROUP A AG, W/REFLEX TO CULT: STREGTOCOCCUS GROUP A AG SCREEN: NOT DETECTED

## 2016-10-02 MED ORDER — LEVONORGEST-ETH ESTRAD 91-DAY 0.15-0.03 MG PO TABS: 1.0000 | ORAL_TABLET | Freq: Every day | ORAL | 3 refills | Status: DC

## 2016-10-02 MED ORDER — LEVONORGEST-ETH ESTRAD 91-DAY 0.15-0.03 MG PO TABS: 1.0000 | ORAL_TABLET | Freq: Every day | ORAL | 0 refills | Status: DC

## 2016-10-02 MED ORDER — OMEPRAZOLE 40 MG PO CPDR: 40.0000 mg | DELAYED_RELEASE_CAPSULE | Freq: Every day | ORAL | 3 refills | Status: DC

## 2016-10-02 NOTE — Progress Notes (Signed)
Patient ID: Ashley [name] MRN: [id number], DOB: 07-Sep-1976, 40 y.o. Date of Encounter: 10/02/2016, 4:12 PM    Chief Complaint:  Chief Complaint  Patient presents with  . Generalized Body Aches    x 2days  . sore throat, ear pain     HPI: 40 y.o. year old female presents with above.   Says the symptoms started Saturday 09/30/16. At that time-- felt achy all over. Last night felt like she had fevers and chills but did not actually check temperature. Says that she just didn't sleep good b/c she was feeling achy. Yesterday and today is having some sore throat and cough. Also has had decreased appetite yesterday and today. No vomiting or diarrhea or abdominal pain. Currently afebrile at 97.9. No known sick contacts.     Home Meds:   Outpatient Medications Prior to Visit  Medication Sig Dispense Refill  . ibuprofen (ADVIL,MOTRIN) 800 MG tablet Take 1 tablet (800 mg total) by mouth every 8 (eight) hours as needed. For pain 30 tablet 0  . JOLESSA 0.15-0.03 MG tablet TAKE ONE TABLET BY MOUTH ONCE DAILY 91 tablet 0  . omeprazole (PRILOSEC) 40 MG capsule TAKE ONE CAPSULE BY MOUTH ONCE DAILY 30 capsule 0  . propranolol (INDERAL) 10 MG tablet Take 10 mg by mouth at bedtime.    Marland Kitchen azithromycin (ZITHROMAX) 250 MG tablet Take (2) tablets by mouth on day 1, then take (1) tablet by mouth on days 2-5. (Patient not taking: Reported on 10/02/2016) 6 tablet 0  . azithromycin (ZITHROMAX) 250 MG tablet Day 1: Take 2 daily. Days 2-5: Take 1 daily. (Patient not taking: Reported on 10/02/2016) 6 tablet 0  . sulfamethoxazole-trimethoprim (BACTRIM DS,SEPTRA DS) 800-160 MG tablet Take 1 tablet by mouth 2 (two) times daily. (Patient not taking: Reported on 10/02/2016) 6 tablet 0  . valACYclovir (VALTREX) 1000 MG tablet Take 2 tablets (2,000 mg total) by mouth 2 (two) times daily. X once (Patient not taking: Reported on 10/02/2016) 4 tablet 4   No facility-administered medications prior to visit.      Allergies:  Allergies  Allergen Reactions  . Penicillins Hives and Shortness Of Breath  . Aspirin Hives  . Levaquin [Levofloxacin In D5w] Hives  . Prednisone   . Vicodin [Hydrocodone-Acetaminophen] Nausea And Vomiting  . Diphenhydramine Hcl Palpitations  . Latex Itching and Rash      Review of Systems: See HPI for pertinent ROS. All other ROS negative.    Physical Exam: Blood pressure 110/72, pulse (!) 104, temperature 97.9 F (36.6 C), temperature source Oral, resp. rate 16, height 5\' 4"  (1.626 m), weight 134 lb (60.8 kg), SpO2 98 %., Body mass index is 23 kg/m. General:  WNWD WF. Appears in no acute distress. HEENT: Normocephalic, atraumatic, eyes without discharge, sclera non-icteric, nares are without discharge. Bilateral auditory canals clear, TM's are without perforation, pearly grey and translucent with reflective cone of light bilaterally. Oral cavity moist, posterior pharynx without exudate, erythema, peritonsillar abscess.  Neck: Supple. No thyromegaly. No lymphadenopathy. Lungs: Clear bilaterally to auscultation without wheezes, rales, or rhonchi. Breathing is unlabored. Heart: Regular rhythm. No murmurs, rubs, or gallops. Abdomen: Soft, non-tender, non-distended with normoactive bowel sounds. No hepatomegaly. No rebound/guarding. No obvious abdominal masses. Msk:  Strength and tone normal for age. Extremities/Skin: Warm and dry. Neuro: Alert and oriented X 3. Moves all extremities spontaneously. Gait is normal. CNII-XII grossly in tact. Psych:  Responds to questions appropriately with a normal affect.   Results for orders placed  or performed in visit on 10/02/16  STREP GROUP A AG, W/REFLEX TO CULT  Result Value Ref Range   SOURCE THROAT SWAB    STREGTOCOCCUS GROUP A AG SCREEN Not Detected      ASSESSMENT AND PLAN:  40 y.o. year old female with  1. Viral illness RST and Flu test negative. Symptoms, exam findings c/w viral illness. Take Tylenol, Motrin for  aches and if has any low grade fever. If symptoms worsen or she develops fever, f/u. As well, if symptoms persist > 7 - 10 days, f/u.  Note given for out of work today and tomorrow. Return 10/04/2016  2. Body aches - Influenza A and B Ag, Immunoassay  3. Streptococcal sore throat - STREP GROUP A AG, W/REFLEX TO CULT    Signed, [address] [place], [place], [facility] 10/02/2016 4:12 PM

## 2016-10-03 ENCOUNTER — Other Ambulatory Visit: Payer: Self-pay

## 2016-10-03 DIAGNOSIS — R52 Pain, unspecified: Secondary | ICD-10-CM

## 2016-10-03 LAB — INFLUENZA A AND B AG, IMMUNOASSAY
INFLUENZA A ANTIGEN: NOT DETECTED
INFLUENZA B ANTIGEN: NOT DETECTED

## 2016-10-03 NOTE — Addendum Note (Signed)
Addended by: Vonna Kotyk A on: 10/03/2016 09:38 AM   Modules accepted: Orders

## 2016-10-04 ENCOUNTER — Telehealth: Payer: Self-pay | Admitting: Family Medicine

## 2016-10-04 LAB — CULTURE, GROUP A STREP: ORGANISM ID, BACTERIA: NORMAL

## 2016-10-04 MED ORDER — AZITHROMYCIN 250 MG PO TABS: ORAL_TABLET | ORAL | 0 refills | Status: DC

## 2016-10-04 NOTE — Telephone Encounter (Signed)
Rx sent in

## 2016-10-04 NOTE — Telephone Encounter (Signed)
Azithromycin 250mg  Day 1: Take 2 daily. Days 2-5: Take 1 daily # 6 (one pack) + 0

## 2016-10-04 NOTE — Telephone Encounter (Signed)
Pt made aware of z-pak called in early.  Told can use OTC Mucinex/Delsym for other symptoms.

## 2016-10-04 NOTE — Telephone Encounter (Signed)
Is feeling worse from Monday.  Thick yellow blood tinged secretions. Cough worse, congestion.  Can we call something in for her?  No insurance please be very generic for cost.

## 2016-11-15 ENCOUNTER — Encounter: Payer: Self-pay | Admitting: Family Medicine

## 2016-11-15 ENCOUNTER — Ambulatory Visit (INDEPENDENT_AMBULATORY_CARE_PROVIDER_SITE_OTHER): Payer: Self-pay | Admitting: Family Medicine

## 2016-11-15 VITALS — BP 112/76 | HR 88 | Temp 99.6°F | Resp 14 | Ht 64.0 in | Wt 135.0 lb

## 2016-11-15 DIAGNOSIS — J0101 Acute recurrent maxillary sinusitis: Secondary | ICD-10-CM

## 2016-11-15 DIAGNOSIS — N760 Acute vaginitis: Secondary | ICD-10-CM

## 2016-11-15 LAB — STREP GROUP A AG, W/REFLEX TO CULT: STREGTOCOCCUS GROUP A AG SCREEN: NOT DETECTED

## 2016-11-15 LAB — WET PREP FOR TRICH, YEAST, CLUE: Trich, Wet Prep: NONE SEEN

## 2016-11-15 MED ORDER — FLUCONAZOLE 150 MG PO TABS: 150.0000 mg | ORAL_TABLET | Freq: Once | ORAL | 1 refills | Status: AC

## 2016-11-15 MED ORDER — AZITHROMYCIN 250 MG PO TABS: ORAL_TABLET | ORAL | 0 refills | Status: DC

## 2016-11-15 NOTE — Patient Instructions (Signed)
Take zpak we will call with other results F/U as needed

## 2016-11-15 NOTE — Progress Notes (Signed)
   Subjective:    Patient ID: Ashley [name], female    DOB: 02/06/1976, 40 y.o.   MRN: [id number]  Patient presents for Illness (sinus issue, positive exposure to strep- productive cough with yellow mucus, nasal drainage and blood tinged mucus) and Vaginal Discharge (states that she has slight discharge with "sweaty" feeling)  Pt here with sinus pressure blood tinged drainage, headache. Has been congested for past 3-4 weeks, using saline, coricidan, flonase with minimal improvement, has known sinus disease had CT a few years ago.  Had mild sore throat and cough, + strep exposure wanted strep screen done  Vaginal discharge- feels sweaty, with mild discharge, sexually active 1 partner   Pt asked about mammogram, her insurance will activate next month, can call for mammogram given paperwork/phone numbers  Review Of Systems:  GEN- denies fatigue, fever, weight loss,weakness, recent illness HEENT- denies eye drainage, change in vision, +nasal discharge, CVS- denies chest pain, palpitations RESP- denies SOB,+ cough, wheeze ABD- denies N/V, change in stools, abd pain GU- denies dysuria, hematuria, dribbling, incontinence MSK- denies joint pain, muscle aches, injury Neuro- denies headache, dizziness, syncope, seizure activity       Objective:    BP 112/76 (BP Location: Left Arm, Patient Position: Sitting, Cuff Size: Normal)   Pulse 88   Temp 99.6 F (37.6 C) (Oral)   Resp 14   Ht 5\' 4"  (1.626 m)   Wt 135 lb (61.2 kg)   SpO2 99%   BMI 23.17 kg/m  GEN- NAD, alert and oriented x3 HEENT- PERRL, EOMI, non injected sclera, pink conjunctiva, MMM, oropharynx clear no exudates TM clear bilat no effusion, + maxillary/frontal  sinus tenderness, inflammed turbinates,  Nasal drainage  Neck- Supple, no LAD CVS- RRR, no murmur RESP-CTAB GU- normal external genitalia, vaginal mucosa pink and moist, cervix visualized no growth, no blood form os,+ white discharge, no CMT, no ovarian masses, uterus  normal size, complained of burning sensation with speculum inserted  Pulses- Radial 2+          Assessment & Plan:      Problem List Items Addressed This Visit    None    Visit Diagnoses    Acute recurrent maxillary sinusitis    -  Primary   zpak given, continue nasal saline, flonase   Relevant Medications   azithromycin (ZITHROMAX) 250 MG tablet   fluconazole (DIFLUCAN) 150 MG tablet   Other Relevant Orders   STREP GROUP A AG, W/REFLEX TO CULT   Vaginitis and vulvovaginitis       GC pending, wet prep shows yeast infection as well, few clue cells, treat with diflucan    Relevant Orders   WET PREP FOR TRICH, YEAST, CLUE   GC/Chlamydia Probe Amp      Note: This dictation was prepared with Dragon dictation along with smaller Company secretary. Any transcriptional errors that result from this process are unintentional.

## 2016-11-16 LAB — GC/CHLAMYDIA PROBE AMP
CT PROBE, AMP APTIMA: NOT DETECTED
GC PROBE AMP APTIMA: NOT DETECTED

## 2016-11-17 LAB — CULTURE, GROUP A STREP: ORGANISM ID, BACTERIA: NORMAL

## 2016-11-24 ENCOUNTER — Encounter: Payer: Self-pay | Admitting: Family Medicine

## 2016-11-24 ENCOUNTER — Ambulatory Visit (INDEPENDENT_AMBULATORY_CARE_PROVIDER_SITE_OTHER): Payer: Self-pay | Admitting: Family Medicine

## 2016-11-24 VITALS — BP 120/68 | HR 78 | Temp 98.1°F | Resp 16 | Ht 64.0 in | Wt 140.0 lb

## 2016-11-24 DIAGNOSIS — R350 Frequency of micturition: Secondary | ICD-10-CM

## 2016-11-24 LAB — URINALYSIS, ROUTINE W REFLEX MICROSCOPIC
Bilirubin Urine: NEGATIVE
Glucose, UA: NEGATIVE
KETONES UR: NEGATIVE
Leukocytes, UA: NEGATIVE
NITRITE: NEGATIVE
PH: 6 (ref 5.0–8.0)

## 2016-11-24 LAB — URINALYSIS, MICROSCOPIC ONLY
CASTS: NONE SEEN [LPF]
CRYSTALS: NONE SEEN [HPF]
Yeast: NONE SEEN [HPF]
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
## 2016-12-04 DIAGNOSIS — Z79899 Other long term (current) drug therapy: Secondary | ICD-10-CM | POA: Diagnosis not present

## 2016-12-04 DIAGNOSIS — Z87891 Personal history of nicotine dependence: Secondary | ICD-10-CM | POA: Diagnosis not present

## 2016-12-04 DIAGNOSIS — R51 Headache: Secondary | ICD-10-CM | POA: Diagnosis not present

## 2016-12-04 DIAGNOSIS — J0100 Acute maxillary sinusitis, unspecified: Secondary | ICD-10-CM | POA: Diagnosis not present

## 2016-12-04 DIAGNOSIS — G43009 Migraine without aura, not intractable, without status migrainosus: Secondary | ICD-10-CM | POA: Diagnosis not present

## 2016-12-05 DIAGNOSIS — Z87891 Personal history of nicotine dependence: Secondary | ICD-10-CM | POA: Diagnosis not present

## 2016-12-05 DIAGNOSIS — E860 Dehydration: Secondary | ICD-10-CM | POA: Diagnosis not present

## 2016-12-05 DIAGNOSIS — G43909 Migraine, unspecified, not intractable, without status migrainosus: Secondary | ICD-10-CM | POA: Diagnosis not present

## 2016-12-05 DIAGNOSIS — R51 Headache: Secondary | ICD-10-CM | POA: Diagnosis not present

## 2016-12-18 ENCOUNTER — Encounter: Payer: Self-pay | Admitting: Family Medicine

## 2016-12-26 DIAGNOSIS — J019 Acute sinusitis, unspecified: Secondary | ICD-10-CM | POA: Diagnosis not present

## 2017-01-01 ENCOUNTER — Other Ambulatory Visit: Payer: Self-pay | Admitting: Family Medicine

## 2017-01-01 DIAGNOSIS — Z1231 Encounter for screening mammogram for malignant neoplasm of breast: Secondary | ICD-10-CM

## 2017-01-04 ENCOUNTER — Ambulatory Visit (HOSPITAL_COMMUNITY): Payer: Medicaid Other

## 2017-01-09 ENCOUNTER — Ambulatory Visit
Admission: RE | Admit: 2017-01-09 | Discharge: 2017-01-09 | Disposition: A | Discharge status: Home / Self Care | Payer: Self-pay | Source: Ambulatory Visit | Attending: Family Medicine | Admitting: Family Medicine

## 2017-01-09 ENCOUNTER — Encounter: Payer: Self-pay | Admitting: Family Medicine

## 2017-01-09 ENCOUNTER — Ambulatory Visit (INDEPENDENT_AMBULATORY_CARE_PROVIDER_SITE_OTHER): Payer: BLUE CROSS/BLUE SHIELD | Admitting: Family Medicine

## 2017-01-09 VITALS — BP 136/90 | HR 84 | Temp 97.8°F | Resp 16 | Ht 64.0 in | Wt 139.0 lb

## 2017-01-09 DIAGNOSIS — G5601 Carpal tunnel syndrome, right upper limb: Secondary | ICD-10-CM

## 2017-01-09 DIAGNOSIS — R3129 Other microscopic hematuria: Secondary | ICD-10-CM | POA: Diagnosis not present

## 2017-01-09 DIAGNOSIS — M7711 Lateral epicondylitis, right elbow: Secondary | ICD-10-CM | POA: Diagnosis not present

## 2017-01-09 DIAGNOSIS — J320 Chronic maxillary sinusitis: Secondary | ICD-10-CM | POA: Diagnosis not present

## 2017-01-09 DIAGNOSIS — Z1231 Encounter for screening mammogram for malignant neoplasm of breast: Secondary | ICD-10-CM

## 2017-01-09 LAB — URINALYSIS, ROUTINE W REFLEX MICROSCOPIC
BILIRUBIN URINE: NEGATIVE
GLUCOSE, UA: NEGATIVE
KETONES UR: NEGATIVE
Leukocytes, UA: NEGATIVE
Nitrite: NEGATIVE
Specific Gravity, Urine: >1.03 (ref 1.001–1.035)
pH: 6 (ref 5.0–8.0)

## 2017-01-09 LAB — URINALYSIS, MICROSCOPIC ONLY
Casts: NONE SEEN [LPF]
Crystals: NONE SEEN [HPF]

## 2017-01-09 MED ORDER — FLUTICASONE FUROATE 27.5 MCG/SPRAY NA SUSP: 2.0000 | Freq: Every day | NASAL | 12 refills | Status: DC
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
## 2017-01-10 ENCOUNTER — Telehealth: Payer: Self-pay | Admitting: Family Medicine

## 2017-01-10 ENCOUNTER — Other Ambulatory Visit: Payer: Self-pay | Admitting: Family Medicine

## 2017-01-10 DIAGNOSIS — R3129 Other microscopic hematuria: Secondary | ICD-10-CM

## 2017-01-10 NOTE — Telephone Encounter (Signed)
Patient calling to say that the drops for her ears were not available at her pharmacy please call her regarding this at  [phone]

## 2017-01-11 ENCOUNTER — Telehealth: Payer: Self-pay | Admitting: Family Medicine

## 2017-01-11 DIAGNOSIS — J320 Chronic maxillary sinusitis: Secondary | ICD-10-CM

## 2017-01-11 MED ORDER — FLUTICASONE FUROATE 27.5 MCG/SPRAY NA SUSP: 2.0000 | Freq: Every day | NASAL | 12 refills | Status: DC

## 2017-01-11 NOTE — Telephone Encounter (Signed)
Spoke to pt and called [facility] - they do not have Veramyst in stock but can order it - informed pt via vm that they can order it and have it in by tomorrow and sent rx to [facility] along with note to order medication.

## 2017-01-11 NOTE — Telephone Encounter (Signed)
Patient calling to say that the drops for her ears were not available at her pharmacy please call her regarding this at [phone], she does not want to go to a different pharmacy just wants a different drop if possible.

## 2017-01-15 ENCOUNTER — Ambulatory Visit (HOSPITAL_COMMUNITY)
Admission: RE | Admit: 2017-01-15 | Discharge: 2017-01-15 | Disposition: A | Discharge status: Home / Self Care | Payer: BLUE CROSS/BLUE SHIELD | Source: Ambulatory Visit | Attending: Family Medicine | Admitting: Family Medicine

## 2017-01-15 ENCOUNTER — Ambulatory Visit (HOSPITAL_COMMUNITY): Payer: BLUE CROSS/BLUE SHIELD

## 2017-01-15 DIAGNOSIS — R3121 Asymptomatic microscopic hematuria: Secondary | ICD-10-CM | POA: Diagnosis not present

## 2017-01-15 DIAGNOSIS — R3129 Other microscopic hematuria: Secondary | ICD-10-CM | POA: Diagnosis not present

## 2017-01-15 DIAGNOSIS — N200 Calculus of kidney: Secondary | ICD-10-CM | POA: Diagnosis not present

## 2017-01-15 DIAGNOSIS — N761 Subacute and chronic vaginitis: Secondary | ICD-10-CM | POA: Diagnosis not present

## 2017-01-15 DIAGNOSIS — R109 Unspecified abdominal pain: Secondary | ICD-10-CM | POA: Diagnosis not present

## 2017-01-16 MED ORDER — MOMETASONE FUROATE 50 MCG/ACT NA SUSP: 2.0000 | Freq: Two times a day (BID) | NASAL | 12 refills | Status: DC

## 2017-01-16 NOTE — Addendum Note (Signed)
Addended by: Shary Decamp B on: 01/16/2017 04:26 PM   Modules accepted: Orders

## 2017-01-16 NOTE — Telephone Encounter (Signed)
Pt called back and states that Veramyst is no longer made - called and verified through pharmacy. Per [facility] send in Nasonex 2 sprays bid.

## 2017-01-19 DIAGNOSIS — Z79899 Other long term (current) drug therapy: Secondary | ICD-10-CM | POA: Diagnosis not present

## 2017-01-19 DIAGNOSIS — G43909 Migraine, unspecified, not intractable, without status migrainosus: Secondary | ICD-10-CM | POA: Diagnosis not present

## 2017-01-19 DIAGNOSIS — R51 Headache: Secondary | ICD-10-CM | POA: Diagnosis not present

## 2017-01-19 DIAGNOSIS — Z87891 Personal history of nicotine dependence: Secondary | ICD-10-CM | POA: Diagnosis not present

## 2017-01-26 ENCOUNTER — Encounter: Payer: Self-pay | Admitting: Family Medicine

## 2017-02-07 ENCOUNTER — Encounter: Payer: Self-pay | Admitting: Family Medicine

## 2017-02-07 ENCOUNTER — Ambulatory Visit: Payer: BLUE CROSS/BLUE SHIELD | Admitting: Physician Assistant

## 2017-02-07 ENCOUNTER — Ambulatory Visit (INDEPENDENT_AMBULATORY_CARE_PROVIDER_SITE_OTHER): Payer: BLUE CROSS/BLUE SHIELD | Admitting: Family Medicine

## 2017-02-07 VITALS — BP 126/90 | HR 82 | Temp 98.4°F | Resp 14 | Ht 64.0 in | Wt 141.0 lb

## 2017-02-07 DIAGNOSIS — B9789 Other viral agents as the cause of diseases classified elsewhere: Secondary | ICD-10-CM

## 2017-02-07 DIAGNOSIS — L0291 Cutaneous abscess, unspecified: Secondary | ICD-10-CM

## 2017-02-07 DIAGNOSIS — J069 Acute upper respiratory infection, unspecified: Secondary | ICD-10-CM

## 2017-02-07 MED ORDER — SULFAMETHOXAZOLE-TRIMETHOPRIM 800-160 MG PO TABS: 1.0000 | ORAL_TABLET | Freq: Two times a day (BID) | ORAL | 0 refills | Status: DC

## 2017-02-07 NOTE — Patient Instructions (Signed)
Use nasal spray Use xyzal Use Over the counter Delsym/ robitussin Bactrim and Dial soap Give you note for 3/7-3/8, can return 3/9

## 2017-02-07 NOTE — Progress Notes (Signed)
   Subjective:    Patient ID: Ashley [name], female    DOB: 1976-08-01, 41 y.o.   MRN: [id number]  Patient presents for Illness (x3 days- productive cough with yellow mucus, sore throat, hoarse, fever/ chills, body aches)   Cough and congestion, cough with mild production for past 3 days. subjective  low grade fever, aches,sore throat. Took ibuprofen.    NO GI symptoms  Works at a daycare   She also has some bumps that come up on her chin neck in her abdomen she is squeezed pus out of them and has been using antibiotic ointment. Denies shaving, no contact with others with rash  Review Of Systems:  GEN- denies fatigue, +fever, weight loss,weakness, recent illness HEENT- denies eye drainage, change in vision,+ nasal discharge, CVS- denies chest pain, palpitations RESP- denies SOB,+ cough, wheeze ABD- denies N/V, change in stools, abd pain GU- denies dysuria, hematuria, dribbling, incontinence MSK- denies joint pain, muscle aches, injury Neuro- denies headache, dizziness, syncope, seizure activity       Objective:    BP 126/90   Pulse 82   Temp 98.4 F (36.9 C) (Oral)   Resp 14   Ht 5\' 4"  (1.626 m)   Wt 141 lb (64 kg)   SpO2 99%   BMI 24.20 kg/m  GEN- NAD, alert and oriented x3 HEENT- PERRL, EOMI, non injected sclera, pink conjunctiva, MMM, oropharynx mild injection, TM clear bilat no effusion,  no maxillary sinus tenderness, +  Nasal drainage  Neck- Supple, no LAD CVS- RRR, no murmur RESP-CTAB Skin- ant neck, back hair line abdomen, small erythematous pustular like lesions, scab over one on chin  EXT- No edema Pulses- Radial 2+          Assessment & Plan:    Does not appear to have Influenza even if mild out of window for treatment   Problem List Items Addressed This Visit    None    Visit Diagnoses    Abscess    -  Primary   small skin infection.appears to be strep infections. 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 OTC cough med, flonase/nasal rinse.  note for work   Relevant Medications   sulfamethoxazole-trimethoprim (BACTRIM DS,SEPTRA DS) 800-160 MG tablet      Note: This dictation was prepared with Dragon dictation along with smaller phrase technology. Any transcriptional errors that result from this process are unintentional.

## 2017-02-12 ENCOUNTER — Encounter: Payer: Self-pay | Admitting: Family Medicine

## 2017-02-12 ENCOUNTER — Ambulatory Visit (INDEPENDENT_AMBULATORY_CARE_PROVIDER_SITE_OTHER): Payer: BLUE CROSS/BLUE SHIELD | Admitting: Family Medicine

## 2017-02-12 VITALS — BP 110/90 | HR 88 | Temp 98.0°F | Resp 16 | Ht 64.0 in | Wt 138.0 lb

## 2017-02-12 DIAGNOSIS — T7840XA Allergy, unspecified, initial encounter: Secondary | ICD-10-CM | POA: Diagnosis not present

## 2017-02-12 DIAGNOSIS — K219 Gastro-esophageal reflux disease without esophagitis: Secondary | ICD-10-CM

## 2017-02-12 MED ORDER — PREDNISONE 20 MG PO TABS: ORAL_TABLET | ORAL | 0 refills | Status: DC

## 2017-02-12 MED ORDER — OMEPRAZOLE 40 MG PO CPDR: 40.0000 mg | DELAYED_RELEASE_CAPSULE | Freq: Every day | ORAL | 3 refills | Status: DC
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
## 2017-02-23 DIAGNOSIS — D225 Melanocytic nevi of trunk: Secondary | ICD-10-CM | POA: Diagnosis not present

## 2017-02-23 DIAGNOSIS — Z87891 Personal history of nicotine dependence: Secondary | ICD-10-CM | POA: Diagnosis not present

## 2017-02-23 DIAGNOSIS — J069 Acute upper respiratory infection, unspecified: Secondary | ICD-10-CM | POA: Diagnosis not present

## 2017-02-23 DIAGNOSIS — Z79899 Other long term (current) drug therapy: Secondary | ICD-10-CM | POA: Diagnosis not present

## 2017-02-23 DIAGNOSIS — G43909 Migraine, unspecified, not intractable, without status migrainosus: Secondary | ICD-10-CM | POA: Diagnosis not present

## 2017-02-23 DIAGNOSIS — J040 Acute laryngitis: Secondary | ICD-10-CM | POA: Diagnosis not present

## 2017-02-23 DIAGNOSIS — R51 Headache: Secondary | ICD-10-CM | POA: Diagnosis not present

## 2017-02-27 ENCOUNTER — Other Ambulatory Visit: Payer: Self-pay | Admitting: Physician Assistant

## 2017-02-27 DIAGNOSIS — K219 Gastro-esophageal reflux disease without esophagitis: Secondary | ICD-10-CM

## 2017-02-28 ENCOUNTER — Telehealth: Payer: Self-pay | Admitting: Family Medicine

## 2017-02-28 NOTE — Telephone Encounter (Signed)
Refill appropriate 

## 2017-02-28 NOTE — Telephone Encounter (Signed)
CB# [phone] Patient called requesting a refill on her prilosec called into [facility] in [place].

## 2017-02-28 NOTE — Telephone Encounter (Signed)
Med was sent in earlier today

## 2017-03-01 ENCOUNTER — Other Ambulatory Visit: Payer: Self-pay

## 2017-03-01 DIAGNOSIS — K219 Gastro-esophageal reflux disease without esophagitis: Secondary | ICD-10-CM

## 2017-03-05 DIAGNOSIS — R3121 Asymptomatic microscopic hematuria: Secondary | ICD-10-CM | POA: Diagnosis not present

## 2017-03-05 DIAGNOSIS — N200 Calculus of kidney: Secondary | ICD-10-CM | POA: Diagnosis not present

## 2017-04-02 ENCOUNTER — Encounter: Payer: Self-pay | Admitting: Family Medicine

## 2017-04-02 DIAGNOSIS — J329 Chronic sinusitis, unspecified: Secondary | ICD-10-CM | POA: Diagnosis not present

## 2017-04-02 DIAGNOSIS — J40 Bronchitis, not specified as acute or chronic: Secondary | ICD-10-CM | POA: Diagnosis not present

## 2017-04-02 DIAGNOSIS — J111 Influenza due to unidentified influenza virus with other respiratory manifestations: Secondary | ICD-10-CM | POA: Diagnosis not present

## 2017-04-02 DIAGNOSIS — J029 Acute pharyngitis, unspecified: Secondary | ICD-10-CM | POA: Diagnosis not present

## 2017-04-23 DIAGNOSIS — J329 Chronic sinusitis, unspecified: Secondary | ICD-10-CM | POA: Diagnosis not present

## 2017-04-23 DIAGNOSIS — J019 Acute sinusitis, unspecified: Secondary | ICD-10-CM | POA: Diagnosis not present

## 2017-04-23 DIAGNOSIS — R51 Headache: Secondary | ICD-10-CM | POA: Diagnosis not present

## 2017-04-23 DIAGNOSIS — Z87891 Personal history of nicotine dependence: Secondary | ICD-10-CM | POA: Diagnosis not present

## 2017-04-23 DIAGNOSIS — Z79899 Other long term (current) drug therapy: Secondary | ICD-10-CM | POA: Diagnosis not present

## 2017-04-23 DIAGNOSIS — G43909 Migraine, unspecified, not intractable, without status migrainosus: Secondary | ICD-10-CM | POA: Diagnosis not present

## 2017-05-17 ENCOUNTER — Ambulatory Visit: Payer: BLUE CROSS/BLUE SHIELD | Admitting: Family Medicine

## 2017-05-21 DIAGNOSIS — Z87891 Personal history of nicotine dependence: Secondary | ICD-10-CM | POA: Diagnosis not present

## 2017-05-21 DIAGNOSIS — R42 Dizziness and giddiness: Secondary | ICD-10-CM | POA: Diagnosis not present

## 2017-05-21 DIAGNOSIS — G43909 Migraine, unspecified, not intractable, without status migrainosus: Secondary | ICD-10-CM | POA: Diagnosis not present

## 2017-05-21 DIAGNOSIS — R51 Headache: Secondary | ICD-10-CM | POA: Diagnosis not present

## 2017-05-21 DIAGNOSIS — Z79899 Other long term (current) drug therapy: Secondary | ICD-10-CM | POA: Diagnosis not present

## 2017-05-23 ENCOUNTER — Other Ambulatory Visit: Payer: Self-pay | Admitting: Physician Assistant

## 2017-05-23 DIAGNOSIS — K219 Gastro-esophageal reflux disease without esophagitis: Secondary | ICD-10-CM

## 2017-05-24 DIAGNOSIS — R300 Dysuria: Secondary | ICD-10-CM | POA: Diagnosis not present

## 2017-05-24 DIAGNOSIS — N760 Acute vaginitis: Secondary | ICD-10-CM | POA: Diagnosis not present

## 2017-05-24 DIAGNOSIS — R350 Frequency of micturition: Secondary | ICD-10-CM | POA: Diagnosis not present

## 2017-05-24 DIAGNOSIS — N898 Other specified noninflammatory disorders of vagina: Secondary | ICD-10-CM | POA: Diagnosis not present

## 2017-05-24 NOTE — Telephone Encounter (Signed)
Refill appropriate 

## 2017-05-29 ENCOUNTER — Encounter: Payer: Self-pay | Admitting: Family Medicine

## 2017-05-29 ENCOUNTER — Ambulatory Visit (INDEPENDENT_AMBULATORY_CARE_PROVIDER_SITE_OTHER): Payer: BLUE CROSS/BLUE SHIELD | Admitting: Family Medicine

## 2017-05-29 VITALS — BP 136/70 | HR 80 | Temp 97.4°F | Resp 14 | Ht 64.0 in | Wt 141.0 lb

## 2017-05-29 DIAGNOSIS — J329 Chronic sinusitis, unspecified: Secondary | ICD-10-CM

## 2017-05-29 DIAGNOSIS — B373 Candidiasis of vulva and vagina: Secondary | ICD-10-CM

## 2017-05-29 DIAGNOSIS — N760 Acute vaginitis: Secondary | ICD-10-CM | POA: Diagnosis not present

## 2017-05-29 DIAGNOSIS — R040 Epistaxis: Secondary | ICD-10-CM | POA: Diagnosis not present

## 2017-05-29 DIAGNOSIS — B3731 Acute candidiasis of vulva and vagina: Secondary | ICD-10-CM

## 2017-05-29 DIAGNOSIS — R350 Frequency of micturition: Secondary | ICD-10-CM

## 2017-05-29 LAB — URINALYSIS, ROUTINE W REFLEX MICROSCOPIC
BILIRUBIN URINE: NEGATIVE
Glucose, UA: NEGATIVE
Leukocytes, UA: NEGATIVE
Nitrite: NEGATIVE
PH: 6 (ref 5.0–8.0)

## 2017-05-29 LAB — WET PREP FOR TRICH, YEAST, CLUE
Clue Cells Wet Prep HPF POC: NONE SEEN
Trich, Wet Prep: NONE SEEN

## 2017-05-29 LAB — URINALYSIS, MICROSCOPIC ONLY
Casts: NONE SEEN [LPF]
Crystals: NONE SEEN [HPF]
Yeast: NONE SEEN [HPF]

## 2017-05-29 MED ORDER — FLUCONAZOLE 150 MG PO TABS: ORAL_TABLET | ORAL | 0 refills | Status: DC

## 2017-05-29 MED ORDER — RIZATRIPTAN BENZOATE 5 MG PO TABS: 5.0000 mg | ORAL_TABLET | ORAL | 0 refills | Status: DC | PRN

## 2017-05-29 NOTE — Patient Instructions (Addendum)
Referral to ENT- Dr. Benjamine Mola Take the allergy medication Complete the antibiotics Take the diflucan take 1 and repeat in 3 days Use Vagisil  Give doctors note for today only F/U as needed

## 2017-05-29 NOTE — Progress Notes (Signed)
    Subjective:    Patient ID: Ashley [name], female    DOB: 1976/11/19, 41 y.o.   MRN: [id number]  Patient presents for Ear Pain (B ears- states that she has fluid in B ears) and Yeast Infection (states that she has been treated at [facility] for yeast with Diflucan, but continues to have itching and irritation)   Seen at UC last week, told she had Yeast infection, given diflucan . Seen by [facility] on [address] , still has vaginal itching, no new partners.   Has urinary frequency and mild burning, told urine normal, has seen urology for hematuria told benign   Needs f/u  A referral for  ENT for recurrent sinus infections- also gets recurrent nosebleeds ., has had drainage, headache, given zpak which she is still taking from UC, stopped nasal steroid due to bleeding with use, no current anti-histamine, no fever  Migraines- note after she left Office called back for refill on maxalt 5mg    Review Of Systems:  GEN- denies fatigue, fever, weight loss,weakness, recent illness HEENT- denies eye drainage, change in vision, nasal discharge, CVS- denies chest pain, palpitations RESP- denies SOB, cough, wheeze ABD- denies N/V, change in stools, abd pain GU- denies dysuria, hematuria, dribbling, incontinence MSK- denies joint pain, muscle aches, injury Neuro- +headache, dizziness, syncope, seizure activity       Objective:    BP 136/70   Pulse 80   Temp 97.4 F (36.3 C) (Oral)   Resp 14   Ht 5\' 4"  (1.626 m)   Wt 141 lb (64 kg)   SpO2 99%   BMI 24.20 kg/m  GEN- NAD, alert and oriented x3 HEENT- PERRL, EOMI, non injected sclera, pink conjunctiva, MMM, oropharynx clear, nares clear rhinorrhea, no maxillary sinus tendernesS, TM clear normal light reflex, no erythema Neck- Supple, no LAD CVS- RRR, no murmur RESP-CTAB ABD-NABS,soft,NT,ND, no CVA tenderness  GU- normal external genitalia, vaginal mucosa pink and moist, cervix visualized no growth, no blood form os, +  discharge, no CMT, no ovarian masses, uterus normal size Pulses- Radial 2+        Assessment & Plan:      Problem List Items Addressed This Visit    None    Visit Diagnoses    Urinary frequency    -  Primary   Send for culture, hematuria has been evaluated, no sigh of UTI today   Relevant Orders   Urinalysis, Routine w reflex microscopic (Completed)   Urine Culture   Vaginal yeast infection       Vaginal irritation may be causing some of the "urinary" symptoms, yeast still seen, retreat with diflucan, can also use vagisil   Relevant Medications   fluconazole (DIFLUCAN) 150 MG tablet   Other Relevant Orders   WET PREP FOR TRICH, YEAST, CLUE (Completed)   GC/Chlamydia Probe Amp   Recurrent sinus infections       Send to new ENT recurrent symptoms, hold nasal steroid, given allegra, use nasal saline, finish zpak   Relevant Medications   fluconazole (DIFLUCAN) 150 MG tablet      Note: This dictation was prepared with Dragon dictation along with smaller phrase technology. Any transcriptional errors that result from this process are unintentional.

## 2017-05-30 LAB — GC/CHLAMYDIA PROBE AMP
CT Probe RNA: NOT DETECTED
GC Probe RNA: NOT DETECTED

## 2017-05-30 LAB — URINE CULTURE: Organism ID, Bacteria: NO GROWTH

## 2017-06-13 DIAGNOSIS — J329 Chronic sinusitis, unspecified: Secondary | ICD-10-CM | POA: Diagnosis not present

## 2017-06-20 ENCOUNTER — Other Ambulatory Visit: Payer: Self-pay | Admitting: Otolaryngology

## 2017-06-20 DIAGNOSIS — J329 Chronic sinusitis, unspecified: Secondary | ICD-10-CM

## 2017-06-22 ENCOUNTER — Encounter: Payer: Self-pay | Admitting: Family Medicine

## 2017-06-25 ENCOUNTER — Other Ambulatory Visit: Payer: BLUE CROSS/BLUE SHIELD

## 2017-06-26 ENCOUNTER — Ambulatory Visit
Admission: RE | Admit: 2017-06-26 | Discharge: 2017-06-26 | Disposition: A | Discharge status: Home / Self Care | Payer: BLUE CROSS/BLUE SHIELD | Source: Ambulatory Visit | Attending: Otolaryngology | Admitting: Otolaryngology

## 2017-06-26 DIAGNOSIS — J0111 Acute recurrent frontal sinusitis: Secondary | ICD-10-CM | POA: Diagnosis not present

## 2017-06-26 DIAGNOSIS — J329 Chronic sinusitis, unspecified: Secondary | ICD-10-CM

## 2017-06-27 DIAGNOSIS — J329 Chronic sinusitis, unspecified: Secondary | ICD-10-CM | POA: Diagnosis not present

## 2017-06-27 DIAGNOSIS — J343 Hypertrophy of nasal turbinates: Secondary | ICD-10-CM | POA: Diagnosis not present

## 2017-06-27 DIAGNOSIS — J342 Deviated nasal septum: Secondary | ICD-10-CM | POA: Diagnosis not present

## 2017-07-18 ENCOUNTER — Encounter (HOSPITAL_COMMUNITY): Payer: Self-pay | Admitting: Emergency Medicine

## 2017-07-18 ENCOUNTER — Emergency Department (HOSPITAL_COMMUNITY)
Admission: EM | Admit: 2017-07-18 | Discharge: 2017-07-18 | Disposition: A | Discharge status: Home / Self Care | Payer: BLUE CROSS/BLUE SHIELD | Attending: Emergency Medicine | Admitting: Emergency Medicine

## 2017-07-18 DIAGNOSIS — R0981 Nasal congestion: Secondary | ICD-10-CM

## 2017-07-18 DIAGNOSIS — Z87891 Personal history of nicotine dependence: Secondary | ICD-10-CM | POA: Insufficient documentation

## 2017-07-18 DIAGNOSIS — Z79899 Other long term (current) drug therapy: Secondary | ICD-10-CM | POA: Insufficient documentation

## 2017-07-18 DIAGNOSIS — Z91040 Latex allergy status: Secondary | ICD-10-CM | POA: Diagnosis not present

## 2017-07-18 DIAGNOSIS — R51 Headache: Secondary | ICD-10-CM | POA: Diagnosis not present

## 2017-07-18 DIAGNOSIS — J329 Chronic sinusitis, unspecified: Secondary | ICD-10-CM | POA: Diagnosis not present

## 2017-07-18 DIAGNOSIS — F439 Reaction to severe stress, unspecified: Secondary | ICD-10-CM

## 2017-07-18 DIAGNOSIS — R0602 Shortness of breath: Secondary | ICD-10-CM | POA: Diagnosis not present

## 2017-07-18 DIAGNOSIS — F419 Anxiety disorder, unspecified: Secondary | ICD-10-CM | POA: Diagnosis not present

## 2017-07-18 MED ORDER — AZITHROMYCIN 250 MG PO TABS: ORAL_TABLET | ORAL | 0 refills | Status: DC

## 2017-07-18 MED ORDER — IBUPROFEN 800 MG PO TABS: 800.0000 mg | ORAL_TABLET | Freq: Three times a day (TID) | ORAL | 0 refills | Status: DC | PRN

## 2017-07-18 NOTE — ED Triage Notes (Signed)
Pt with hx deviated septum c/o sinus pain, SOB, dizziness, headache onset last night. Pt attributes symptoms to sinus infection and anxiety. Plan to have surgery on deviated septum in 2 weeks. Headache feels like her typical sinus infection, but dizziness and SOB are not. Hx of similar symptoms, had negative cardiac workup at that time.

## 2017-07-18 NOTE — Discharge Instructions (Signed)
Follow-up with your ENT doctor as scheduled to get surgery on your deviated septum.  Work on IT trainer.  Return here, if needed, for problems.
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
## 2017-08-01 ENCOUNTER — Encounter: Payer: Self-pay | Admitting: Family Medicine

## 2017-08-02 DIAGNOSIS — J320 Chronic maxillary sinusitis: Secondary | ICD-10-CM | POA: Diagnosis not present

## 2017-08-02 DIAGNOSIS — J322 Chronic ethmoidal sinusitis: Secondary | ICD-10-CM | POA: Diagnosis not present

## 2017-08-02 DIAGNOSIS — J343 Hypertrophy of nasal turbinates: Secondary | ICD-10-CM | POA: Diagnosis not present

## 2017-08-02 DIAGNOSIS — J342 Deviated nasal septum: Secondary | ICD-10-CM | POA: Diagnosis not present

## 2017-08-21 ENCOUNTER — Telehealth: Payer: Self-pay | Admitting: Family Medicine

## 2017-08-21 NOTE — Telephone Encounter (Signed)
Patient calling to get refills on her omeprozole and jolessa  [facility] [place]

## 2017-08-22 ENCOUNTER — Other Ambulatory Visit: Payer: Self-pay

## 2017-08-22 DIAGNOSIS — K219 Gastro-esophageal reflux disease without esophagitis: Secondary | ICD-10-CM

## 2017-08-22 MED ORDER — OMEPRAZOLE 40 MG PO CPDR: 40.0000 mg | DELAYED_RELEASE_CAPSULE | Freq: Every day | ORAL | 1 refills | Status: DC

## 2017-08-22 MED ORDER — LEVONORGEST-ETH ESTRAD 91-DAY 0.15-0.03 MG PO TABS: 1.0000 | ORAL_TABLET | Freq: Every day | ORAL | 1 refills | Status: DC

## 2017-08-22 NOTE — Telephone Encounter (Signed)
Refills sent to [facility]

## 2017-08-27 DIAGNOSIS — J069 Acute upper respiratory infection, unspecified: Secondary | ICD-10-CM | POA: Diagnosis not present

## 2017-08-27 DIAGNOSIS — J329 Chronic sinusitis, unspecified: Secondary | ICD-10-CM | POA: Diagnosis not present

## 2017-08-27 DIAGNOSIS — J209 Acute bronchitis, unspecified: Secondary | ICD-10-CM | POA: Diagnosis not present

## 2017-09-11 DIAGNOSIS — J111 Influenza due to unidentified influenza virus with other respiratory manifestations: Secondary | ICD-10-CM | POA: Diagnosis not present

## 2017-09-11 DIAGNOSIS — J029 Acute pharyngitis, unspecified: Secondary | ICD-10-CM | POA: Diagnosis not present

## 2017-09-22 DIAGNOSIS — J0100 Acute maxillary sinusitis, unspecified: Secondary | ICD-10-CM | POA: Diagnosis not present

## 2017-10-05 DIAGNOSIS — H40013 Open angle with borderline findings, low risk, bilateral: Secondary | ICD-10-CM | POA: Diagnosis not present

## 2017-10-12 ENCOUNTER — Other Ambulatory Visit: Payer: Self-pay | Admitting: *Deleted

## 2017-10-12 MED ORDER — PROPRANOLOL HCL 10 MG PO TABS: 10.0000 mg | ORAL_TABLET | Freq: Every day | ORAL | 0 refills | Status: DC

## 2017-10-12 NOTE — Telephone Encounter (Signed)
Received call from patient.   Reports that she has run out of Propanolol and neurology office is closed.   Requested a few days refill until she can get in touch with Neuro on Monday.   MD made aware and approved a one time fill.   Call placed to patient and patient made aware.

## 2017-10-16 DIAGNOSIS — J329 Chronic sinusitis, unspecified: Secondary | ICD-10-CM | POA: Diagnosis not present

## 2017-10-16 DIAGNOSIS — J069 Acute upper respiratory infection, unspecified: Secondary | ICD-10-CM | POA: Diagnosis not present

## 2017-10-16 DIAGNOSIS — J209 Acute bronchitis, unspecified: Secondary | ICD-10-CM | POA: Diagnosis not present

## 2017-10-16 DIAGNOSIS — H1010 Acute atopic conjunctivitis, unspecified eye: Secondary | ICD-10-CM | POA: Diagnosis not present

## 2017-10-19 DIAGNOSIS — J310 Chronic rhinitis: Secondary | ICD-10-CM | POA: Diagnosis not present

## 2017-11-04 DIAGNOSIS — R197 Diarrhea, unspecified: Secondary | ICD-10-CM | POA: Diagnosis not present

## 2017-11-04 DIAGNOSIS — J0101 Acute recurrent maxillary sinusitis: Secondary | ICD-10-CM | POA: Diagnosis not present

## 2017-11-06 DIAGNOSIS — G43019 Migraine without aura, intractable, without status migrainosus: Secondary | ICD-10-CM | POA: Diagnosis not present

## 2017-11-06 DIAGNOSIS — M545 Low back pain: Secondary | ICD-10-CM | POA: Diagnosis not present

## 2017-11-06 DIAGNOSIS — Z79899 Other long term (current) drug therapy: Secondary | ICD-10-CM | POA: Diagnosis not present

## 2017-11-06 DIAGNOSIS — M542 Cervicalgia: Secondary | ICD-10-CM | POA: Diagnosis not present

## 2017-11-26 DIAGNOSIS — J0101 Acute recurrent maxillary sinusitis: Secondary | ICD-10-CM | POA: Diagnosis not present

## 2017-11-29 ENCOUNTER — Other Ambulatory Visit: Payer: Self-pay

## 2017-11-29 ENCOUNTER — Encounter (HOSPITAL_COMMUNITY): Payer: Self-pay

## 2017-11-29 ENCOUNTER — Emergency Department (HOSPITAL_COMMUNITY)
Admission: EM | Admit: 2017-11-29 | Discharge: 2017-11-29 | Disposition: A | Discharge status: Home / Self Care | Payer: BLUE CROSS/BLUE SHIELD | Attending: Emergency Medicine | Admitting: Emergency Medicine

## 2017-11-29 DIAGNOSIS — R42 Dizziness and giddiness: Secondary | ICD-10-CM | POA: Diagnosis not present

## 2017-11-29 DIAGNOSIS — R110 Nausea: Secondary | ICD-10-CM | POA: Diagnosis not present

## 2017-11-29 DIAGNOSIS — Z87891 Personal history of nicotine dependence: Secondary | ICD-10-CM | POA: Diagnosis not present

## 2017-11-29 DIAGNOSIS — Z79899 Other long term (current) drug therapy: Secondary | ICD-10-CM | POA: Insufficient documentation

## 2017-11-29 LAB — CBC WITH DIFFERENTIAL/PLATELET
BASOS ABS: 0 10*3/uL (ref 0.0–0.1)
BASOS PCT: 1 %
EOS PCT: 1 %
Eosinophils Absolute: 0.1 10*3/uL (ref 0.0–0.7)
HCT: 39.6 % (ref 36.0–46.0)
Hemoglobin: 13.5 g/dL (ref 12.0–15.0)
LYMPHS PCT: 22 %
Lymphs Abs: 1.4 10*3/uL (ref 0.7–4.0)
MCH: 31 pg (ref 26.0–34.0)
MCHC: 34.1 g/dL (ref 30.0–36.0)
MCV: 91 fL (ref 78.0–100.0)
MONO ABS: 0.5 10*3/uL (ref 0.1–1.0)
Monocytes Relative: 7 %
NEUTROS ABS: 4.5 10*3/uL (ref 1.7–7.7)
Neutrophils Relative %: 69 %
PLATELETS: 184 10*3/uL (ref 150–400)
RBC: 4.35 MIL/uL (ref 3.87–5.11)
RDW: 12.3 % (ref 11.5–15.5)
WBC: 6.5 10*3/uL (ref 4.0–10.5)

## 2017-11-29 LAB — URINALYSIS, ROUTINE W REFLEX MICROSCOPIC
Bilirubin Urine: NEGATIVE
Glucose, UA: NEGATIVE mg/dL
Ketones, ur: 5 mg/dL — AB
Leukocytes, UA: NEGATIVE
Nitrite: NEGATIVE
Protein, ur: NEGATIVE mg/dL
Specific Gravity, Urine: 1.017 (ref 1.005–1.030)
pH: 6 (ref 5.0–8.0)

## 2017-11-29 LAB — BASIC METABOLIC PANEL
ANION GAP: 7 (ref 5–15)
BUN: 11 mg/dL (ref 6–20)
CALCIUM: 8.7 mg/dL — AB (ref 8.9–10.3)
CO2: 22 mmol/L (ref 22–32)
Chloride: 108 mmol/L (ref 101–111)
Creatinine, Ser: 0.63 mg/dL (ref 0.44–1.00)
Glucose, Bld: 88 mg/dL (ref 65–99)
POTASSIUM: 3.5 mmol/L (ref 3.5–5.1)
Sodium: 137 mmol/L (ref 135–145)

## 2017-11-29 LAB — POC URINE PREG, ED: Preg Test, Ur: NEGATIVE

## 2017-11-29 MED ORDER — MECLIZINE HCL 25 MG PO TABS: 25.0000 mg | ORAL_TABLET | Freq: Three times a day (TID) | ORAL | 0 refills | Status: DC | PRN

## 2017-11-29 MED ORDER — PROMETHAZINE HCL 25 MG PO TABS: 25.0000 mg | ORAL_TABLET | Freq: Four times a day (QID) | ORAL | 0 refills | Status: DC | PRN

## 2017-11-29 MED ORDER — MECLIZINE HCL 25 MG PO TABS: 25.0000 mg | ORAL_TABLET | Freq: Once | ORAL | Status: DC

## 2017-11-29 MED ORDER — ONDANSETRON 8 MG PO TBDP: 8.0000 mg | ORAL_TABLET | Freq: Once | ORAL | Status: DC
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
## 2017-11-29 NOTE — Discharge Instructions (Signed)
Continue Z-Pak.  Take decongestants such has Zyrtec or Sudafed.  Meclizine as prescribed as needed for dizziness.  Phenergan for nausea and vomiting.  Follow-up with family doctor

## 2017-11-29 NOTE — ED Notes (Signed)
Patient ambulated in hall without assistance, witnessed by EDPA.

## 2017-11-29 NOTE — ED Notes (Signed)
ONE UNSUCCESSFUL BLOOD COLLECTION

## 2017-11-29 NOTE — ED Triage Notes (Signed)
Patient states she was getting out of her car today and had a near syncopal episode. Patient states she is currently taking the Z-pack. Patient reports a history of vertigo and feels more dizzy when she is sitting back.

## 2017-12-07 DIAGNOSIS — R1313 Dysphagia, pharyngeal phase: Secondary | ICD-10-CM | POA: Diagnosis not present

## 2017-12-07 DIAGNOSIS — J329 Chronic sinusitis, unspecified: Secondary | ICD-10-CM | POA: Diagnosis not present

## 2017-12-10 DIAGNOSIS — Z87891 Personal history of nicotine dependence: Secondary | ICD-10-CM | POA: Diagnosis not present

## 2017-12-10 DIAGNOSIS — G43009 Migraine without aura, not intractable, without status migrainosus: Secondary | ICD-10-CM | POA: Diagnosis not present

## 2017-12-10 DIAGNOSIS — R51 Headache: Secondary | ICD-10-CM | POA: Diagnosis not present

## 2017-12-10 DIAGNOSIS — Z79899 Other long term (current) drug therapy: Secondary | ICD-10-CM | POA: Diagnosis not present

## 2017-12-11 ENCOUNTER — Other Ambulatory Visit: Payer: Self-pay | Admitting: Otolaryngology

## 2017-12-11 DIAGNOSIS — J329 Chronic sinusitis, unspecified: Secondary | ICD-10-CM

## 2017-12-13 ENCOUNTER — Encounter: Payer: Self-pay | Admitting: Family Medicine

## 2017-12-13 ENCOUNTER — Ambulatory Visit: Payer: BLUE CROSS/BLUE SHIELD | Admitting: Family Medicine

## 2017-12-13 VITALS — BP 130/80 | HR 108 | Temp 98.0°F | Resp 16 | Ht 64.0 in | Wt 144.0 lb

## 2017-12-13 DIAGNOSIS — D239 Other benign neoplasm of skin, unspecified: Secondary | ICD-10-CM | POA: Diagnosis not present

## 2017-12-13 DIAGNOSIS — R102 Pelvic and perineal pain: Secondary | ICD-10-CM
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
## 2017-12-21 ENCOUNTER — Ambulatory Visit
Admission: RE | Admit: 2017-12-21 | Discharge: 2017-12-21 | Disposition: A | Discharge status: Home / Self Care | Payer: BLUE CROSS/BLUE SHIELD | Source: Ambulatory Visit | Attending: Otolaryngology | Admitting: Otolaryngology

## 2017-12-21 DIAGNOSIS — J329 Chronic sinusitis, unspecified: Secondary | ICD-10-CM | POA: Diagnosis not present

## 2017-12-22 DIAGNOSIS — J0101 Acute recurrent maxillary sinusitis: Secondary | ICD-10-CM | POA: Diagnosis not present

## 2018-01-03 DIAGNOSIS — R311 Benign essential microscopic hematuria: Secondary | ICD-10-CM | POA: Diagnosis not present

## 2018-01-06 DIAGNOSIS — Z6823 Body mass index (BMI) 23.0-23.9, adult: Secondary | ICD-10-CM | POA: Diagnosis not present

## 2018-01-06 DIAGNOSIS — J0101 Acute recurrent maxillary sinusitis: Secondary | ICD-10-CM | POA: Diagnosis not present

## 2018-01-11 DIAGNOSIS — J069 Acute upper respiratory infection, unspecified: Secondary | ICD-10-CM | POA: Diagnosis not present

## 2018-01-11 DIAGNOSIS — J329 Chronic sinusitis, unspecified: Secondary | ICD-10-CM | POA: Diagnosis not present

## 2018-01-11 DIAGNOSIS — H6090 Unspecified otitis externa, unspecified ear: Secondary | ICD-10-CM | POA: Diagnosis not present

## 2018-01-14 ENCOUNTER — Other Ambulatory Visit: Payer: Self-pay | Admitting: Family Medicine

## 2018-01-14 DIAGNOSIS — Z1231 Encounter for screening mammogram for malignant neoplasm of breast: Secondary | ICD-10-CM

## 2018-01-18 ENCOUNTER — Ambulatory Visit (HOSPITAL_COMMUNITY)
Admission: RE | Admit: 2018-01-18 | Discharge: 2018-01-18 | Disposition: A | Discharge status: Home / Self Care | Payer: BLUE CROSS/BLUE SHIELD | Source: Ambulatory Visit | Attending: Family Medicine | Admitting: Family Medicine

## 2018-01-18 DIAGNOSIS — R928 Other abnormal and inconclusive findings on diagnostic imaging of breast: Secondary | ICD-10-CM | POA: Insufficient documentation

## 2018-01-18 DIAGNOSIS — Z1231 Encounter for screening mammogram for malignant neoplasm of breast: Secondary | ICD-10-CM

## 2018-01-21 ENCOUNTER — Other Ambulatory Visit: Payer: Self-pay | Admitting: Family Medicine

## 2018-01-21 DIAGNOSIS — R928 Other abnormal and inconclusive findings on diagnostic imaging of breast: Secondary | ICD-10-CM

## 2018-01-21 DIAGNOSIS — N6310 Unspecified lump in the right breast, unspecified quadrant: Secondary | ICD-10-CM

## 2018-01-22 ENCOUNTER — Ambulatory Visit (HOSPITAL_COMMUNITY)
Admission: RE | Admit: 2018-01-22 | Discharge: 2018-01-22 | Disposition: A | Discharge status: Home / Self Care | Payer: BLUE CROSS/BLUE SHIELD | Source: Ambulatory Visit | Attending: Family Medicine | Admitting: Family Medicine

## 2018-01-22 ENCOUNTER — Other Ambulatory Visit: Payer: Self-pay | Admitting: Family Medicine

## 2018-01-22 DIAGNOSIS — N6313 Unspecified lump in the right breast, lower outer quadrant: Secondary | ICD-10-CM | POA: Diagnosis not present

## 2018-01-22 DIAGNOSIS — N6310 Unspecified lump in the right breast, unspecified quadrant: Secondary | ICD-10-CM

## 2018-01-22 DIAGNOSIS — R928 Other abnormal and inconclusive findings on diagnostic imaging of breast: Secondary | ICD-10-CM

## 2018-01-22 DIAGNOSIS — R922 Inconclusive mammogram: Secondary | ICD-10-CM | POA: Diagnosis not present

## 2018-01-23 ENCOUNTER — Other Ambulatory Visit: Payer: Self-pay | Admitting: Family Medicine

## 2018-01-23 DIAGNOSIS — N6310 Unspecified lump in the right breast, unspecified quadrant: Secondary | ICD-10-CM

## 2018-01-25 ENCOUNTER — Other Ambulatory Visit: Payer: BLUE CROSS/BLUE SHIELD

## 2018-01-29 ENCOUNTER — Other Ambulatory Visit: Payer: Self-pay | Admitting: Family Medicine

## 2018-01-29 ENCOUNTER — Ambulatory Visit (HOSPITAL_COMMUNITY)
Admission: RE | Admit: 2018-01-29 | Discharge: 2018-01-29 | Disposition: A | Discharge status: Home / Self Care | Payer: BLUE CROSS/BLUE SHIELD | Source: Ambulatory Visit | Attending: Family Medicine | Admitting: Family Medicine

## 2018-01-29 DIAGNOSIS — D0511 Intraductal carcinoma in situ of right breast: Secondary | ICD-10-CM | POA: Insufficient documentation

## 2018-01-29 DIAGNOSIS — R928 Other abnormal and inconclusive findings on diagnostic imaging of breast: Secondary | ICD-10-CM

## 2018-01-29 DIAGNOSIS — N6314 Unspecified lump in the right breast, lower inner quadrant: Secondary | ICD-10-CM | POA: Diagnosis not present

## 2018-01-29 DIAGNOSIS — N6310 Unspecified lump in the right breast, unspecified quadrant: Secondary | ICD-10-CM | POA: Diagnosis not present

## 2018-01-29 DIAGNOSIS — N6313 Unspecified lump in the right breast, lower outer quadrant: Secondary | ICD-10-CM | POA: Diagnosis not present

## 2018-01-29 DIAGNOSIS — D241 Benign neoplasm of right breast: Secondary | ICD-10-CM | POA: Diagnosis not present

## 2018-01-29 MED ORDER — LIDOCAINE HCL (PF) 1 % IJ SOLN
INTRAMUSCULAR | Status: AC
  Administered 2018-01-29: 5 mL
  Filled 2018-01-29: qty 5

## 2018-01-29 MED ORDER — LIDOCAINE-EPINEPHRINE (PF) 1 %-1:200000 IJ SOLN
INTRAMUSCULAR | Status: AC
  Administered 2018-01-29: 6 mL
  Filled 2018-01-29: qty 30

## 2018-02-01 ENCOUNTER — Telehealth: Payer: Self-pay

## 2018-02-01 NOTE — Telephone Encounter (Signed)
Ashley [name] called to confirm [facility] report was available.Confirmed report was avail . [name] asked who would put referral in. I explained they could put referral in

## 2018-02-03 DIAGNOSIS — J029 Acute pharyngitis, unspecified: Secondary | ICD-10-CM | POA: Diagnosis not present

## 2018-02-03 DIAGNOSIS — J0101 Acute recurrent maxillary sinusitis: Secondary | ICD-10-CM | POA: Diagnosis not present

## 2018-02-05 ENCOUNTER — Encounter (HOSPITAL_COMMUNITY): Payer: BLUE CROSS/BLUE SHIELD

## 2018-02-08 ENCOUNTER — Other Ambulatory Visit: Payer: Self-pay | Admitting: Surgery

## 2018-02-08 DIAGNOSIS — D0511 Intraductal carcinoma in situ of right breast: Secondary | ICD-10-CM | POA: Diagnosis not present

## 2018-02-12 NOTE — Progress Notes (Addendum)
PCP: Jenna Luo, MD  Cardiologist: pt denies  EKG: 11/30/17 in EPIC  Stress test: pt denies ever  ECHO: pt denies ever  Cardiac Cath: pt denies ever  Chest x-ray: pt denies past year, denies recent respiratory infection  Patient had severe nausea and vomiting following sinus surgery at [facility].  She does not want this to occur with this surgery.  Requested records from the outpatient surgical center to determine what anesthesia was given.  Surgery was 07/2017 per patient. Patient is unsure if she can drink the Ensure presurgery drink without vomiting, I advised for her to try and do the best she can and if she feels nauseated then she does not have to drink the entire drink.  She verbalized understanding.

## 2018-02-12 NOTE — Pre-Procedure Instructions (Addendum)
Brynne KORINNA TAT  02/12/2018      [place], [place] - [address] [place] [place] [address] Phone: [phone] Fax: [phone]    Your procedure is scheduled on February 18, 2018.  Report to [facility] Admitting at [place] AM.  Call this number if you have problems the morning of surgery:  [phone]   Remember:  Do not eat food or drink liquids after midnight.  Take these medicines the morning of surgery with A SIP OF WATER propranolol (inderal), tylenol-if needed, nasonex nasal spray, ocean nasal spray-if needed, phenergan-if needed for nausea, omeprazole (prilosec),    Please finish drinking you Ensure Pre-surgery drink before leaving your house the day of surgery.   7 days prior to surgery STOP taking any Aspirin (unless otherwise instructed by your surgeon), Aleve, Naproxen, Ibuprofen, Motrin, Advil, Goody's, BC's, all herbal medications, fish oil, and all vitamins  Continue all other medications as instructed by your physician except follow the above medication instructions before surgery   Do not wear jewelry, make-up or nail polish.  Do not wear lotions, powders, or perfumes, or deodorant.  Do not shave 48 hours prior to surgery.    Do not bring valuables to the hospital.  [facility] is not responsible for any belongings or valuables.  Contacts, dentures or bridgework may not be worn into surgery.  Leave your suitcase in the car.  After surgery it may be brought to your room.  For patients admitted to the hospital, discharge time will be determined by your treatment team.  Patients discharged the day of surgery will not be allowed to drive home.   [place]- Preparing For Surgery  Before surgery, you can play an important role. Because skin is not sterile, your skin needs to be as free of germs as possible. You can reduce the number of germs on your skin by washing with CHG (chlorahexidine gluconate) Soap  before surgery.  CHG is an antiseptic cleaner which kills germs and bonds with the skin to continue killing germs even after washing.  Please do not use if you have an allergy to CHG or antibacterial soaps. If your skin becomes reddened/irritated stop using the CHG.  Do not shave (including legs and underarms) for at least 48 hours prior to first CHG shower. It is OK to shave your face.  Please follow these instructions carefully.   1. Shower the NIGHT BEFORE SURGERY and the MORNING OF SURGERY with CHG.   2. If you chose to wash your hair, wash your hair first as usual with your normal shampoo.  3. After you shampoo, rinse your hair and body thoroughly to remove the shampoo.  4. Use CHG as you would any other liquid soap. You can apply CHG directly to the skin and wash gently with a scrungie or a clean washcloth.   5. Apply the CHG Soap to your body ONLY FROM THE NECK DOWN.  Do not use on open wounds or open sores. Avoid contact with your eyes, ears, mouth and genitals (private parts). Wash Face and genitals (private parts)  with your normal soap.  6. Wash thoroughly, paying special attention to the area where your surgery will be performed.  7. Thoroughly rinse your body with warm water from the neck down.  8. DO NOT shower/wash with your normal soap after using and rinsing off the CHG Soap.  9. Pat yourself dry with a CLEAN TOWEL.  10.  Wear CLEAN PAJAMAS to bed the night before surgery, wear comfortable clothes the morning of surgery  11. Place CLEAN SHEETS on your bed the night of your first shower and DO NOT SLEEP WITH PETS.  Day of Surgery: Do not apply any deodorants/lotions. Please wear clean clothes to the hospital/surgery center.    Please read over the following fact sheets that you were given. Pain Booklet, Coughing and Deep Breathing and Surgical Site Infection Prevention

## 2018-02-13 ENCOUNTER — Encounter (HOSPITAL_COMMUNITY): Payer: Self-pay

## 2018-02-13 ENCOUNTER — Encounter (HOSPITAL_COMMUNITY)
Admission: RE | Admit: 2018-02-13 | Discharge: 2018-02-13 | Disposition: A | Discharge status: Home / Self Care | Payer: BLUE CROSS/BLUE SHIELD | Source: Ambulatory Visit | Attending: Surgery | Admitting: Surgery

## 2018-02-13 DIAGNOSIS — D0511 Intraductal carcinoma in situ of right breast: Secondary | ICD-10-CM | POA: Diagnosis not present

## 2018-02-13 DIAGNOSIS — Z01818 Encounter for other preprocedural examination: Secondary | ICD-10-CM | POA: Diagnosis not present

## 2018-02-13 HISTORY — DX: Adverse effect of unspecified anesthetic, initial encounter: T41.45XA

## 2018-02-13 HISTORY — DX: Other complications of anesthesia, initial encounter: T88.59XA

## 2018-02-13 HISTORY — DX: Nausea with vomiting, unspecified: R11.2

## 2018-02-13 HISTORY — DX: Other specified postprocedural states: Z98.890

## 2018-02-13 LAB — BASIC METABOLIC PANEL
ANION GAP: 8 (ref 5–15)
BUN: 8 mg/dL (ref 6–20)
CO2: 22 mmol/L (ref 22–32)
Calcium: 9 mg/dL (ref 8.9–10.3)
Chloride: 111 mmol/L (ref 101–111)
Creatinine, Ser: 0.73 mg/dL (ref 0.44–1.00)
GFR calc Af Amer: >60 mL/min (ref >60)
GLUCOSE: 89 mg/dL (ref 65–99)
Potassium: 3.7 mmol/L (ref 3.5–5.1)
Sodium: 141 mmol/L (ref 135–145)

## 2018-02-13 LAB — CBC
HEMATOCRIT: 38.9 % (ref 36.0–46.0)
HEMOGLOBIN: 13 g/dL (ref 12.0–15.0)
MCH: 31.2 pg (ref 26.0–34.0)
MCHC: 33.4 g/dL (ref 30.0–36.0)
MCV: 93.3 fL (ref 78.0–100.0)
Platelets: 205 10*3/uL (ref 150–400)
RBC: 4.17 MIL/uL (ref 3.87–5.11)
RDW: 12.4 % (ref 11.5–15.5)
WBC: 7.7 10*3/uL (ref 4.0–10.5)

## 2018-02-13 MED ORDER — CHLORHEXIDINE GLUCONATE CLOTH 2 % EX PADS: 6.0000 | MEDICATED_PAD | Freq: Once | CUTANEOUS | Status: DC
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
## 2018-02-15 ENCOUNTER — Ambulatory Visit
Admission: RE | Admit: 2018-02-15 | Discharge: 2018-02-15 | Disposition: A | Discharge status: Home / Self Care | Payer: BLUE CROSS/BLUE SHIELD | Source: Ambulatory Visit | Attending: Surgery | Admitting: Surgery

## 2018-02-15 ENCOUNTER — Telehealth: Payer: Self-pay

## 2018-02-15 DIAGNOSIS — D0511 Intraductal carcinoma in situ of right breast: Secondary | ICD-10-CM

## 2018-02-15 NOTE — Telephone Encounter (Signed)
Patient called and states she is having surgery on 02/18/2018 and was instructed to take only tylenol for pain. Patient states she has been taking tylenol extra strength and is asking if she can exceed take more than 6 in 24 hour period. Patient was instructed to take the recommended dose and not to exceed the recommended amount.   Patient also states she has been taking Claritin and it is causing her to become drowsy. Patient was instructed to take the Claritin in the evening when she is settled in at home.

## 2018-02-17 NOTE — H&P (Signed)
Ashley [name] Documented: 02/08/2018 10:08 AM Location: [facility] Patient #: [id number] DOB: [birth year] Single / Language: Ashley [name] / Race: White Female   History of Present Illness (Signa Cheek A. Ninfa Linden MD; 02/08/2018 10:33 AM) The patient is a 42 year old female who presents with breast cancer. This is a 42 year old female referred by Dr. Jenna Luo for evaluation of the new diagnosis of ductal carcinoma in situ of the right breast. She had a small lesion seen in the right breast at the 6:30 position on screening mammography. Ultrasound showed a 8 mm lesion. A stereotactic biopsy was performed showing a papilloma with DCIS. It is ER and PR positive. She has no previous history of problems with her breasts. She denies nipple discharge. She has a remote family history of breast cancer in a grandmother. She is otherwise healthy without complaints.   Allergies (Danielle Gerrigner, CMA; 02/08/2018 10:09 AM) Penicillins  Aspirin *ANALGESICS - NonNarcotic*  Latex  Hydrocodone-Acetaminophen *ANALGESICS - OPIOID*  Levaquin *FLUOROQUINOLONES*  Cipro *FLUOROQUINOLONES*  Allergies Reconciled   Medication History (Danielle Gerrigner, CMA; 02/08/2018 10:10 AM) Mometasone Furoate (50MCG/ACT Suspension, Nasal) Active. Neomycin-Polymyxin-HC (3.5-10000-1 Solution, Otic) Active. Omeprazole (40MG  Capsule DR, Oral) Active. Promethazine HCl (25MG  Tablet, Oral) Active. Propranolol HCl (10MG  Tablet, Oral) Active. Rizatriptan Benzoate (10MG  Tablet, Oral) Active. Medications Reconciled  Vitals (Danielle Gerrigner CMA; 02/08/2018 10:11 AM) 02/08/2018 10:10 AM Weight: 141.38 lb Height: 64in Body Surface Area: 1.69 m Body Mass Index: 24.27 kg/m  Temp.: 98.11F(Oral)  Pulse: 73 (Regular)  BP: 170/102 (Sitting, Left Arm, Standard)       Physical Exam (Varun Jourdan A. Ninfa Linden MD; 02/08/2018 10:34 AM) General Mental Status-Alert. General Appearance-Consistent with  stated age. Hydration-Well hydrated. Voice-Normal.  Head and Neck Head-normocephalic, atraumatic with no lesions or palpable masses. Trachea-midline. Thyroid Gland Characteristics - normal size and consistency.  Eye Eyeball - Bilateral-Extraocular movements intact. Sclera/Conjunctiva - Bilateral-No scleral icterus.  Chest and Lung Exam Chest and lung exam reveals -quiet, even and easy respiratory effort with no use of accessory muscles and on auscultation, normal breath sounds, no adventitious sounds and normal vocal resonance. Inspection Chest Wall - Normal. Back - normal.  Breast Breast - Left-Symmetric, Non Tender, No Biopsy scars, no Dimpling, No Inflammation, No Lumpectomy scars, No Mastectomy scars, No Peau d' Orange. Breast - Right-Symmetric, Non Tender, No Biopsy scars, no Dimpling, No Inflammation, No Lumpectomy scars, No Mastectomy scars, No Peau d' Orange. Breast Lump-No Palpable Breast Mass.  Cardiovascular Cardiovascular examination reveals -normal heart sounds, regular rate and rhythm with no murmurs and normal pedal pulses bilaterally.  Abdomen Inspection Inspection of the abdomen reveals - No Hernias. Skin - Scar - no surgical scars. Palpation/Percussion Palpation and Percussion of the abdomen reveal - Soft, Non Tender, No Rebound tenderness, No Rigidity (guarding) and No hepatosplenomegaly. Auscultation Auscultation of the abdomen reveals - Bowel sounds normal.  Neurologic - Did not examine.  Musculoskeletal - Did not examine.  Lymphatic Head & Neck  General Head & Neck Lymphatics: Bilateral - Description - Normal. Axillary  General Axillary Region: Bilateral - Description - Normal. Tenderness - Non Tender. Femoral & Inguinal - Did not examine.    Assessment & Plan (Masayo Fera A. Ninfa Linden MD; 02/08/2018 10:36 AM) Ashley [name] CARCINOMA IN SITU (DCIS) OF RIGHT BREAST (D05.11) Impression: This is a patient with the recent diagnosis of  ductal carcinoma in situ of the right breast. It is strongly ER and PR positive. I had a long discussion with the patient and her husband. We discussed breast conservation with  a radioactive seed guided partial mastectomy with postoperative radiation versus a complete mastectomy. We discussed long-term results of both. She is interested in breast conservation. I discussed the surgical procedure with her in detail. We discussed that this is not invasive cancer and I was not looking lymph nodes. We discussed the surgical risks. These include but are not limited to bleeding, infection, the need for further surgery margins are positive, injury to surrounding structures, cardio pulmonary issues, postoperative recovery, etc. She will be referred to both medical and radiation oncology. Surgery will be scheduled as soon as possible

## 2018-02-18 ENCOUNTER — Encounter (HOSPITAL_COMMUNITY)
Admission: RE | Disposition: A | Discharge status: Home / Self Care | Payer: Self-pay | Source: Ambulatory Visit | Attending: Surgery

## 2018-02-18 ENCOUNTER — Ambulatory Visit (HOSPITAL_COMMUNITY)
Admission: RE | Admit: 2018-02-18 | Discharge: 2018-02-18 | Disposition: A | Discharge status: Home / Self Care | Payer: BLUE CROSS/BLUE SHIELD | Source: Ambulatory Visit | Attending: Surgery | Admitting: Surgery

## 2018-02-18 ENCOUNTER — Encounter (HOSPITAL_COMMUNITY): Payer: Self-pay | Admitting: Anesthesiology

## 2018-02-18 ENCOUNTER — Ambulatory Visit (HOSPITAL_COMMUNITY): Payer: BLUE CROSS/BLUE SHIELD | Admitting: Vascular Surgery

## 2018-02-18 ENCOUNTER — Ambulatory Visit
Admission: RE | Admit: 2018-02-18 | Discharge: 2018-02-18 | Disposition: A | Discharge status: Home / Self Care | Payer: BLUE CROSS/BLUE SHIELD | Source: Ambulatory Visit | Attending: Surgery | Admitting: Surgery

## 2018-02-18 ENCOUNTER — Ambulatory Visit (HOSPITAL_COMMUNITY): Payer: BLUE CROSS/BLUE SHIELD

## 2018-02-18 DIAGNOSIS — Z87891 Personal history of nicotine dependence: Secondary | ICD-10-CM | POA: Diagnosis not present

## 2018-02-18 DIAGNOSIS — D0511 Intraductal carcinoma in situ of right breast: Secondary | ICD-10-CM

## 2018-02-18 DIAGNOSIS — R928 Other abnormal and inconclusive findings on diagnostic imaging of breast: Secondary | ICD-10-CM | POA: Diagnosis not present

## 2018-02-18 DIAGNOSIS — J309 Allergic rhinitis, unspecified: Secondary | ICD-10-CM | POA: Diagnosis not present

## 2018-02-18 DIAGNOSIS — Z170 Estrogen receptor positive status [ER+]: Secondary | ICD-10-CM | POA: Diagnosis not present

## 2018-02-18 DIAGNOSIS — K5900 Constipation, unspecified: Secondary | ICD-10-CM | POA: Diagnosis not present

## 2018-02-18 DIAGNOSIS — R3129 Other microscopic hematuria: Secondary | ICD-10-CM | POA: Diagnosis not present

## 2018-02-18 HISTORY — PX: BREAST LUMPECTOMY WITH RADIOACTIVE SEED LOCALIZATION: SHX6424

## 2018-02-18 LAB — POCT PREGNANCY, URINE: Preg Test, Ur: NEGATIVE

## 2018-02-18 SURGERY — BREAST LUMPECTOMY WITH RADIOACTIVE SEED LOCALIZATION
Anesthesia: General | Site: Breast | Laterality: Right

## 2018-02-18 MED ORDER — SODIUM CHLORIDE 0.9 % IV SOLN: 250.0000 mL | INTRAVENOUS | Status: DC | PRN

## 2018-02-18 MED ORDER — FENTANYL CITRATE (PF) 100 MCG/2ML IJ SOLN
INTRAMUSCULAR | Status: DC | PRN
  Administered 2018-02-18: 100 ug via INTRAVENOUS
  Administered 2018-02-18: 50 ug via INTRAVENOUS
  Administered 2018-02-18: 25 ug via INTRAVENOUS

## 2018-02-18 MED ORDER — DEXAMETHASONE SODIUM PHOSPHATE 10 MG/ML IJ SOLN
INTRAMUSCULAR | Status: AC
  Filled 2018-02-18: qty 1

## 2018-02-18 MED ORDER — SODIUM CHLORIDE 0.9% FLUSH: 3.0000 mL | Freq: Two times a day (BID) | INTRAVENOUS | Status: DC

## 2018-02-18 MED ORDER — LACTATED RINGERS IV SOLN: INTRAVENOUS | Status: DC

## 2018-02-18 MED ORDER — FAMOTIDINE IN NACL 20-0.9 MG/50ML-% IV SOLN
INTRAVENOUS | Status: AC
  Filled 2018-02-18: qty 50

## 2018-02-18 MED ORDER — SODIUM CHLORIDE 0.9% FLUSH: 3.0000 mL | INTRAVENOUS | Status: DC | PRN

## 2018-02-18 MED ORDER — PROPOFOL 10 MG/ML IV BOLUS
INTRAVENOUS | Status: AC
  Filled 2018-02-18: qty 20

## 2018-02-18 MED ORDER — ACETAMINOPHEN 500 MG PO TABS
1000.0000 mg | ORAL_TABLET | ORAL | Status: AC
  Administered 2018-02-18: 1000 mg via ORAL

## 2018-02-18 MED ORDER — ACETAMINOPHEN 650 MG RE SUPP: 650.0000 mg | RECTAL | Status: DC | PRN

## 2018-02-18 MED ORDER — MIDAZOLAM HCL 2 MG/2ML IJ SOLN
INTRAMUSCULAR | Status: AC
  Filled 2018-02-18: qty 2

## 2018-02-18 MED ORDER — LACTATED RINGERS IV SOLN
INTRAVENOUS | Status: DC
  Administered 2018-02-18: 12:00:00 via INTRAVENOUS

## 2018-02-18 MED ORDER — FENTANYL CITRATE (PF) 100 MCG/2ML IJ SOLN
INTRAMUSCULAR | Status: AC
  Filled 2018-02-18: qty 2

## 2018-02-18 MED ORDER — ACETAMINOPHEN 500 MG PO TABS
ORAL_TABLET | ORAL | Status: AC
  Filled 2018-02-18: qty 2

## 2018-02-18 MED ORDER — PROPOFOL 10 MG/ML IV BOLUS
INTRAVENOUS | Status: DC | PRN
  Administered 2018-02-18: 200 mg via INTRAVENOUS

## 2018-02-18 MED ORDER — ONDANSETRON HCL 4 MG/2ML IJ SOLN
INTRAMUSCULAR | Status: DC | PRN
  Administered 2018-02-18: 4 mg via INTRAVENOUS

## 2018-02-18 MED ORDER — GABAPENTIN 300 MG PO CAPS
300.0000 mg | ORAL_CAPSULE | ORAL | Status: AC
  Administered 2018-02-18: 300 mg via ORAL

## 2018-02-18 MED ORDER — FENTANYL CITRATE (PF) 100 MCG/2ML IJ SOLN: 25.0000 ug | INTRAMUSCULAR | Status: DC | PRN

## 2018-02-18 MED ORDER — KETOROLAC TROMETHAMINE 30 MG/ML IJ SOLN
INTRAMUSCULAR | Status: AC
  Filled 2018-02-18: qty 1

## 2018-02-18 MED ORDER — BUPIVACAINE-EPINEPHRINE 0.25% -1:200000 IJ SOLN
INTRAMUSCULAR | Status: DC | PRN
  Administered 2018-02-18: 30 mL

## 2018-02-18 MED ORDER — VANCOMYCIN HCL IN DEXTROSE 1-5 GM/200ML-% IV SOLN
INTRAVENOUS | Status: AC
  Filled 2018-02-18: qty 200

## 2018-02-18 MED ORDER — VANCOMYCIN HCL 1000 MG IV SOLR
INTRAVENOUS | Status: DC | PRN
  Administered 2018-02-18: 1000 mg via INTRAVENOUS

## 2018-02-18 MED ORDER — KETOROLAC TROMETHAMINE 30 MG/ML IJ SOLN
30.0000 mg | Freq: Once | INTRAMUSCULAR | Status: AC
  Administered 2018-02-18: 30 mg via INTRAVENOUS

## 2018-02-18 MED ORDER — BUPIVACAINE-EPINEPHRINE (PF) 0.25% -1:200000 IJ SOLN
INTRAMUSCULAR | Status: AC
  Filled 2018-02-18: qty 30

## 2018-02-18 MED ORDER — MIDAZOLAM HCL 5 MG/5ML IJ SOLN
INTRAMUSCULAR | Status: DC | PRN
  Administered 2018-02-18: 2 mg via INTRAVENOUS

## 2018-02-18 MED ORDER — 0.9 % SODIUM CHLORIDE (POUR BTL) OPTIME
TOPICAL | Status: DC | PRN
  Administered 2018-02-18: 1000 mL

## 2018-02-18 MED ORDER — GABAPENTIN 300 MG PO CAPS
ORAL_CAPSULE | ORAL | Status: AC
  Filled 2018-02-18: qty 1

## 2018-02-18 MED ORDER — VANCOMYCIN HCL IN DEXTROSE 1-5 GM/200ML-% IV SOLN: 1000.0000 mg | INTRAVENOUS | Status: DC

## 2018-02-18 MED ORDER — FENTANYL CITRATE (PF) 250 MCG/5ML IJ SOLN
INTRAMUSCULAR | Status: AC
  Filled 2018-02-18: qty 5

## 2018-02-18 MED ORDER — DEXMEDETOMIDINE HCL 200 MCG/2ML IV SOLN
INTRAVENOUS | Status: DC | PRN
  Administered 2018-02-18: 64.4 ug via INTRAVENOUS

## 2018-02-18 MED ORDER — ONDANSETRON HCL 4 MG/2ML IJ SOLN
INTRAMUSCULAR | Status: AC
  Filled 2018-02-18: qty 2

## 2018-02-18 MED ORDER — LIDOCAINE HCL (CARDIAC) 20 MG/ML IV SOLN
INTRAVENOUS | Status: DC | PRN
  Administered 2018-02-18: 100 mg via INTRAVENOUS

## 2018-02-18 MED ORDER — DEXAMETHASONE SODIUM PHOSPHATE 10 MG/ML IJ SOLN
INTRAMUSCULAR | Status: DC | PRN
  Administered 2018-02-18: 10 mg via INTRAVENOUS

## 2018-02-18 MED ORDER — LIDOCAINE HCL (CARDIAC) 20 MG/ML IV SOLN
INTRAVENOUS | Status: AC
  Filled 2018-02-18: qty 5

## 2018-02-18 MED ORDER — FAMOTIDINE IN NACL 20-0.9 MG/50ML-% IV SOLN
20.0000 mg | Freq: Once | INTRAVENOUS | Status: AC
  Administered 2018-02-18: 20 mg via INTRAVENOUS

## 2018-02-18 MED ORDER — ACETAMINOPHEN 325 MG PO TABS: 650.0000 mg | ORAL_TABLET | ORAL | Status: DC | PRN

## 2018-02-18 SURGICAL SUPPLY — 39 items
APPLIER CLIP 9.375 MED OPEN (MISCELLANEOUS) ×2
BINDER BREAST LRG (GAUZE/BANDAGES/DRESSINGS) ×2 IMPLANT
BINDER BREAST XLRG (GAUZE/BANDAGES/DRESSINGS) IMPLANT
BLADE SURG 15 STRL LF DISP TIS (BLADE) ×1 IMPLANT
BLADE SURG 15 STRL SS (BLADE) ×1
CANISTER SUCT 3000ML PPV (MISCELLANEOUS) ×2 IMPLANT
CHLORAPREP W/TINT 26ML (MISCELLANEOUS) ×2 IMPLANT
CLIP APPLIE 9.375 MED OPEN (MISCELLANEOUS) ×1 IMPLANT
CONT SPEC 4OZ CLIKSEAL STRL BL (MISCELLANEOUS) ×2 IMPLANT
COVER PROBE W GEL 5X96 (DRAPES) ×2 IMPLANT
COVER SURGICAL LIGHT HANDLE (MISCELLANEOUS) ×2 IMPLANT
DERMABOND ADVANCED (GAUZE/BANDAGES/DRESSINGS) ×1
DERMABOND ADVANCED .7 DNX12 (GAUZE/BANDAGES/DRESSINGS) ×1 IMPLANT
DEVICE DUBIN SPECIMEN MAMMOGRA (MISCELLANEOUS) ×2 IMPLANT
DRAPE CHEST BREAST 15X10 FENES (DRAPES) ×2 IMPLANT
DRAPE UTILITY XL STRL (DRAPES) ×2 IMPLANT
ELECT CAUTERY BLADE 6.4 (BLADE) ×2 IMPLANT
ELECT REM PT RETURN 9FT ADLT (ELECTROSURGICAL) ×2
ELECTRODE REM PT RTRN 9FT ADLT (ELECTROSURGICAL) ×1 IMPLANT
GLOVE SURG SIGNA 7.5 PF LTX (GLOVE) ×2 IMPLANT
GOWN STRL REUS W/ TWL LRG LVL3 (GOWN DISPOSABLE) ×1 IMPLANT
GOWN STRL REUS W/ TWL XL LVL3 (GOWN DISPOSABLE) ×1 IMPLANT
GOWN STRL REUS W/TWL LRG LVL3 (GOWN DISPOSABLE) ×1
GOWN STRL REUS W/TWL XL LVL3 (GOWN DISPOSABLE) ×1
KIT BASIN OR (CUSTOM PROCEDURE TRAY) ×2 IMPLANT
KIT MARKER MARGIN INK (KITS) ×2 IMPLANT
NEEDLE HYPO 25GX1X1/2 BEV (NEEDLE) ×2 IMPLANT
NS IRRIG 1000ML POUR BTL (IV SOLUTION) ×2 IMPLANT
PACK SURGICAL SETUP 50X90 (CUSTOM PROCEDURE TRAY) ×2 IMPLANT
PENCIL BUTTON HOLSTER BLD 10FT (ELECTRODE) ×2 IMPLANT
SPONGE LAP 18X18 X RAY DECT (DISPOSABLE) ×2 IMPLANT
SUT MNCRL AB 4-0 PS2 18 (SUTURE) ×2 IMPLANT
SUT VIC AB 3-0 SH 18 (SUTURE) ×2 IMPLANT
SYR BULB 3OZ (MISCELLANEOUS) ×2 IMPLANT
SYR CONTROL 10ML LL (SYRINGE) ×2 IMPLANT
TOWEL OR 17X24 6PK STRL BLUE (TOWEL DISPOSABLE) ×2 IMPLANT
TOWEL OR 17X26 10 PK STRL BLUE (TOWEL DISPOSABLE) IMPLANT
TUBE CONNECTING 12X1/4 (SUCTIONS) ×2 IMPLANT
YANKAUER SUCT BULB TIP NO VENT (SUCTIONS) ×2 IMPLANT

## 2018-02-18 NOTE — Anesthesia Procedure Notes (Signed)
Procedure Name: LMA Insertion Date/Time: 02/18/2018 12:05 PM Performed by: Shirlyn Goltz, CRNA Pre-anesthesia Checklist: Patient identified, Emergency Drugs available, Patient being monitored and Suction available Patient Re-evaluated:Patient Re-evaluated prior to induction Oxygen Delivery Method: Circle system utilized Preoxygenation: Pre-oxygenation with 100% oxygen Induction Type: IV induction LMA: LMA inserted LMA Size: 4.0 Number of attempts: 1 Placement Confirmation: positive ETCO2 and breath sounds checked- equal and bilateral Tube secured with: Tape Comments: Performed by B. Ruffin Frederick

## 2018-02-18 NOTE — Op Note (Signed)
RADIOACTIVE SEED GUIDED RIGHT BREAST PARTIAL MASTECTOMY  Procedure Note  Ashley [name] 02/18/2018   Pre-op Diagnosis: DCIS RIGHT BREAST     Post-op Diagnosis: same  Procedure(s): RADIOACTIVE SEED GUIDED RIGHT BREAST PARTIAL MASTECTOMY  Surgeon(s): Coralie Keens, MD  Anesthesia: General  Staff:  Circulator: Nicholos Johns, RN Scrub Person: Paulette Blanch, RN; Delrae Rend Circulator Assistant: Beryle Lathe, RN  Estimated Blood Loss: Minimal               Specimens: sent to path  Indications: This is a 42 year old female who was found to have DCIS involving a papilloma of the right breast on stereotactic biopsy.  The decision was made to proceed with a right breast radioactive seed guided partial mastectomy  Procedure: The patient was brought to the operating room and identified as the correct patient.  She was placed supine on the operating room table and general anesthesia was induced.  Her right breast was prepped and draped in the usual sterile fashion.  The neoprobe was used to locate the radioactive seed which was in the inferior breast slightly laterally.  We anesthetized the skin of the lower edge of the areola with Marcaine.  We then made a circumareolar incision with a scalpel.  We then dissected down to the breast tissue with electrocautery.  With the aid of the neoprobe, we stay widely around the radioactive seed going all the way down to the chest wall.  The partial mastectomy of the right breast was then completed with the aid of the neoprobe and the cautery.  Once the specimen was removed, I marked all margins with marker paint.  X-ray was performed confirming that the radioactive seed and previous marker were in the specimen.  I decided to go ahead and take some further inferior lateral posterior margin which was sent separately to pathology.  Hemostasis was then achieved with the cautery.  We then closed the subtenons tissue with interrupted 3-0 Vicryl  sutures and closed skin with a running 4-0 Monocryl after anesthetized the wound further.  Dermabond was then applied.  The patient tolerated the procedure well.  All the counts were correct at the end of the procedure.  The patient was then extubated in the operating room and taken in a stable condition to the recovery room.          Ashley [name] A   Date: 02/18/2018  Time: 12:46 PM

## 2018-02-18 NOTE — Anesthesia Postprocedure Evaluation (Signed)
Anesthesia Post Note  Patient: [name]  Procedure(s) Performed: RADIOACTIVE SEED GUIDED RIGHT BREAST PARTIAL MASTECTOMY (Right Breast)     Patient location during evaluation: PACU Anesthesia Type: General Level of consciousness: awake and alert Pain management: pain level controlled Vital Signs Assessment: post-procedure vital signs reviewed and stable Respiratory status: spontaneous breathing, nonlabored ventilation, respiratory function stable and patient connected to nasal cannula oxygen Cardiovascular status: blood pressure returned to baseline and stable Postop Assessment: no apparent nausea or vomiting Anesthetic complications: no    Last Vitals:  Vitals:   02/18/18 0956 02/18/18 1302  BP: (!) 159/89   Pulse: 87   Resp: 18   Temp: 36.4 C (!) 36.2 C  SpO2: 100%     Last Pain:  Vitals:   02/18/18 0956  TempSrc: Oral                 Barnet Glasgow

## 2018-02-18 NOTE — Anesthesia Preprocedure Evaluation (Addendum)
Anesthesia Evaluation  Patient identified by MRN, date of birth, ID band Patient awake    Reviewed: Allergy & Precautions, NPO status , Patient's Chart, lab work & pertinent test results  History of Anesthesia Complications (+) PONV and history of anesthetic complications  Airway Mallampati: I       Dental no notable dental hx. (+) Teeth Intact   Pulmonary former smoker,    Pulmonary exam normal breath sounds clear to auscultation       Cardiovascular negative cardio ROS Normal cardiovascular exam Rhythm:Regular Rate:Normal     Neuro/Psych negative psych ROS   GI/Hepatic Neg liver ROS,   Endo/Other  negative endocrine ROS  Renal/GU negative Renal ROS  negative genitourinary   Musculoskeletal negative musculoskeletal ROS (+)   Abdominal Normal abdominal exam  (+)   Peds  Hematology negative hematology ROS (+)   Anesthesia Other Findings   Reproductive/Obstetrics negative OB ROS                             Anesthesia Physical Anesthesia Plan  ASA: II  Anesthesia Plan: General   Post-op Pain Management:    Induction: Intravenous  PONV Risk Score and Plan: 4 or greater and Ondansetron, Dexamethasone, Midazolam and Scopolamine patch - Pre-op  Airway Management Planned: LMA  Additional Equipment:   Intra-op Plan:   Post-operative Plan:   Informed Consent: I have reviewed the patients History and Physical, chart, labs and discussed the procedure including the risks, benefits and alternatives for the proposed anesthesia with the patient or authorized representative who has indicated his/her understanding and acceptance.     Plan Discussed with: CRNA and Surgeon  Anesthesia Plan Comments:        Anesthesia Quick Evaluation

## 2018-02-18 NOTE — Transfer of Care (Signed)
Immediate Anesthesia Transfer of Care Note  Patient: Ashley [name]  Procedure(s) Performed: RADIOACTIVE SEED GUIDED RIGHT BREAST PARTIAL MASTECTOMY (Right Breast)  Patient Location: PACU  Anesthesia Type:General  Level of Consciousness: drowsy and patient cooperative  Airway & Oxygen Therapy: Patient Spontanous Breathing and Patient connected to nasal cannula oxygen  Post-op Assessment: Report given to RN and Post -op Vital signs reviewed and stable  Post vital signs: Reviewed and stable  Last Vitals:  Vitals:   02/18/18 0956  BP: (!) 159/89  Pulse: 87  Resp: 18  Temp: 36.4 C  SpO2: 100%    Last Pain:  Vitals:   02/18/18 0956  TempSrc: Oral         Complications: No apparent anesthesia complications

## 2018-02-18 NOTE — Discharge Instructions (Signed)
[facility] °Office Phone Number [phone] ° °BREAST BIOPSY/ PARTIAL MASTECTOMY: POST OP INSTRUCTIONS ° °Always review your discharge instruction sheet given to you by the facility where your surgery was performed. ° °IF YOU HAVE DISABILITY OR FAMILY LEAVE FORMS, YOU MUST BRING THEM TO THE OFFICE FOR PROCESSING.  DO NOT GIVE THEM TO YOUR DOCTOR. ° °1. A prescription for pain medication may be given to you upon discharge.  Take your pain medication as prescribed, if needed.  If narcotic pain medicine is not needed, then you may take acetaminophen (Tylenol) or ibuprofen (Advil) as needed. °2. Take your usually prescribed medications unless otherwise directed °3. If you need a refill on your pain medication, please contact your pharmacy.  They will contact our office to request authorization.  Prescriptions will not be filled after 5pm or on week-ends. °4. You should eat very light the first 24 hours after surgery, such as soup, crackers, pudding, etc.  Resume your normal diet the day after surgery. °5. Most patients will experience some swelling and bruising in the breast.  Ice packs and a good support bra will help.  Swelling and bruising can take several days to resolve.  °6. It is common to experience some constipation if taking pain medication after surgery.  Increasing fluid intake and taking a stool softener will usually help or prevent this problem from occurring.  A mild laxative (Milk of Magnesia or Miralax) should be taken according to package directions if there are no bowel movements after 48 hours. °7. Unless discharge instructions indicate otherwise, you may remove your bandages 24-48 hours after surgery, and you may shower at that time.  You may have steri-strips (small skin tapes) in place directly over the incision.  These strips should be left on the skin for 7-10 days.  If your surgeon used skin glue on the incision, you may shower in 24 hours.  The glue will flake off over the  next 2-3 weeks.  Any sutures or staples will be removed at the office during your follow-up visit. °8. ACTIVITIES:  You may resume regular daily activities (gradually increasing) beginning the next day.  Wearing a good support bra or sports bra minimizes pain and swelling.  You may have sexual intercourse when it is comfortable. °a. You may drive when you no longer are taking prescription pain medication, you can comfortably wear a seatbelt, and you can safely maneuver your car and apply brakes. °b. RETURN TO WORK:  ______________________________________________________________________________________ °9. You should see your doctor in the office for a follow-up appointment approximately two weeks after your surgery.  Your doctor’s nurse will typically make your follow-up appointment when she calls you with your pathology report.  Expect your pathology report 2-3 business days after your surgery.  You may call to check if you do not hear from us after three days. °10. OTHER INSTRUCTIONS: _______________________________________________________________________________________________ _____________________________________________________________________________________________________________________________________ °_____________________________________________________________________________________________________________________________________ °_____________________________________________________________________________________________________________________________________ ° °WHEN TO CALL YOUR DOCTOR: °1. Fever over 101.0 °2. Nausea and/or vomiting. °3. Extreme swelling or bruising. °4. Continued bleeding from incision. °5. Increased pain, redness, or drainage from the incision. ° °The clinic staff is available to answer your questions during regular business hours.  Please don’t hesitate to call and ask to speak to one of the nurses for clinical concerns.  If you have a medical emergency, go to the nearest  emergency room or call 911.  A surgeon from [facility] is always on call at the hospital. ° °For further questions, please visit [email or web address]  °

## 2018-02-18 NOTE — Interval H&P Note (Signed)
History and Physical Interval Note:no change in H and P  02/18/2018 10:00 AM  Ashley [name]  has presented today for surgery, with the diagnosis of DCIS RIGHT BREAST  The various methods of treatment have been discussed with the patient and family. After consideration of risks, benefits and other options for treatment, the patient has consented to  Procedure(s): RADIOACTIVE SEED GUIDED RIGHT BREAST PARTIAL MASTECTOMY (Right) as a surgical intervention .  The patient's history has been reviewed, patient examined, no change in status, stable for surgery.  I have reviewed the patient's chart and labs.  Questions were answered to the patient's satisfaction.     [name] A

## 2018-02-18 NOTE — Progress Notes (Signed)
One time dose of toradol ordered by Dr. Valma Cava flagged because of patient allergy to aspirin. Patient states she usually takes toradol when she has a migraine. Spoke with Dr. Valma Cava regarding contraindication and that patient previously tolerated med. Was told to give med.

## 2018-02-19 ENCOUNTER — Encounter (HOSPITAL_COMMUNITY): Payer: Self-pay | Admitting: Surgery

## 2018-02-20 ENCOUNTER — Other Ambulatory Visit: Payer: Self-pay | Admitting: Surgery

## 2018-02-21 ENCOUNTER — Encounter: Payer: Self-pay | Admitting: Hematology and Oncology

## 2018-02-21 ENCOUNTER — Telehealth: Payer: Self-pay | Admitting: Hematology and Oncology

## 2018-02-21 NOTE — Telephone Encounter (Signed)
Appt has been scheduled for the pt to see Dr. Lindi Adie on 3/26 at 2pm. Pt aware to arrive 30 minutes early. Letter mailed.

## 2018-02-22 ENCOUNTER — Encounter: Payer: Self-pay | Admitting: Radiation Oncology

## 2018-02-25 ENCOUNTER — Telehealth: Payer: Self-pay | Admitting: Hematology and Oncology

## 2018-02-25 ENCOUNTER — Other Ambulatory Visit: Payer: Self-pay

## 2018-02-25 ENCOUNTER — Other Ambulatory Visit: Payer: Self-pay | Admitting: Family Medicine

## 2018-02-25 ENCOUNTER — Encounter (HOSPITAL_BASED_OUTPATIENT_CLINIC_OR_DEPARTMENT_OTHER): Payer: Self-pay | Admitting: *Deleted

## 2018-02-25 DIAGNOSIS — K219 Gastro-esophageal reflux disease without esophagitis: Secondary | ICD-10-CM

## 2018-02-25 NOTE — Telephone Encounter (Signed)
Appt has been cancelled per request of pt via voicemail. Lft a vm for the pt to reschedule appt
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
## 2018-02-26 ENCOUNTER — Ambulatory Visit (HOSPITAL_BASED_OUTPATIENT_CLINIC_OR_DEPARTMENT_OTHER): Payer: BLUE CROSS/BLUE SHIELD | Admitting: Certified Registered"

## 2018-02-26 ENCOUNTER — Ambulatory Visit (HOSPITAL_BASED_OUTPATIENT_CLINIC_OR_DEPARTMENT_OTHER)
Admission: RE | Admit: 2018-02-26 | Discharge: 2018-02-26 | Disposition: A | Discharge status: Home / Self Care | Payer: BLUE CROSS/BLUE SHIELD | Source: Ambulatory Visit | Attending: Surgery | Admitting: Surgery

## 2018-02-26 ENCOUNTER — Ambulatory Visit: Payer: BLUE CROSS/BLUE SHIELD | Admitting: Hematology and Oncology

## 2018-02-26 ENCOUNTER — Encounter (HOSPITAL_BASED_OUTPATIENT_CLINIC_OR_DEPARTMENT_OTHER): Payer: Self-pay

## 2018-02-26 ENCOUNTER — Encounter (HOSPITAL_BASED_OUTPATIENT_CLINIC_OR_DEPARTMENT_OTHER)
Admission: RE | Disposition: A | Discharge status: Home / Self Care | Payer: Self-pay | Source: Ambulatory Visit | Attending: Surgery

## 2018-02-26 ENCOUNTER — Other Ambulatory Visit: Payer: Self-pay

## 2018-02-26 DIAGNOSIS — K219 Gastro-esophageal reflux disease without esophagitis: Secondary | ICD-10-CM | POA: Diagnosis not present

## 2018-02-26 DIAGNOSIS — J019 Acute sinusitis, unspecified: Secondary | ICD-10-CM | POA: Diagnosis not present

## 2018-02-26 DIAGNOSIS — Z885 Allergy status to narcotic agent status: Secondary | ICD-10-CM | POA: Diagnosis not present

## 2018-02-26 DIAGNOSIS — Z87891 Personal history of nicotine dependence: Secondary | ICD-10-CM | POA: Diagnosis not present

## 2018-02-26 DIAGNOSIS — Z888 Allergy status to other drugs, medicaments and biological substances status: Secondary | ICD-10-CM | POA: Insufficient documentation

## 2018-02-26 DIAGNOSIS — Z886 Allergy status to analgesic agent status: Secondary | ICD-10-CM | POA: Diagnosis not present

## 2018-02-26 DIAGNOSIS — Z881 Allergy status to other antibiotic agents status: Secondary | ICD-10-CM | POA: Diagnosis not present

## 2018-02-26 DIAGNOSIS — Z79899 Other long term (current) drug therapy: Secondary | ICD-10-CM | POA: Diagnosis not present

## 2018-02-26 DIAGNOSIS — D0511 Intraductal carcinoma in situ of right breast: Secondary | ICD-10-CM | POA: Insufficient documentation

## 2018-02-26 DIAGNOSIS — Z882 Allergy status to sulfonamides status: Secondary | ICD-10-CM | POA: Insufficient documentation

## 2018-02-26 DIAGNOSIS — M545 Low back pain: Secondary | ICD-10-CM | POA: Diagnosis not present

## 2018-02-26 DIAGNOSIS — Z880 Allergy status to penicillin: Secondary | ICD-10-CM | POA: Diagnosis not present

## 2018-02-26 DIAGNOSIS — C50911 Malignant neoplasm of unspecified site of right female breast: Secondary | ICD-10-CM | POA: Diagnosis not present

## 2018-02-26 DIAGNOSIS — R300 Dysuria: Secondary | ICD-10-CM | POA: Diagnosis not present

## 2018-02-26 HISTORY — PX: RE-EXCISION OF BREAST CANCER,SUPERIOR MARGINS: SHX6047

## 2018-02-26 SURGERY — RE-EXCISION OF BREAST CANCER,SUPERIOR MARGINS
Anesthesia: General | Site: Breast | Laterality: Right

## 2018-02-26 MED ORDER — ONDANSETRON HCL 4 MG/2ML IJ SOLN
INTRAMUSCULAR | Status: AC
  Filled 2018-02-26: qty 2

## 2018-02-26 MED ORDER — CHLORHEXIDINE GLUCONATE CLOTH 2 % EX PADS: 6.0000 | MEDICATED_PAD | Freq: Once | CUTANEOUS | Status: DC

## 2018-02-26 MED ORDER — SCOPOLAMINE 1 MG/3DAYS TD PT72: 1.0000 | MEDICATED_PATCH | Freq: Once | TRANSDERMAL | Status: DC

## 2018-02-26 MED ORDER — LACTATED RINGERS IV SOLN
INTRAVENOUS | Status: DC
  Administered 2018-02-26 (×2): via INTRAVENOUS

## 2018-02-26 MED ORDER — TRAMADOL HCL 50 MG PO TABS: 50.0000 mg | ORAL_TABLET | Freq: Four times a day (QID) | ORAL | 0 refills | Status: DC | PRN

## 2018-02-26 MED ORDER — MIDAZOLAM HCL 2 MG/2ML IJ SOLN
1.0000 mg | INTRAMUSCULAR | Status: DC | PRN
  Administered 2018-02-26: 2 mg via INTRAVENOUS

## 2018-02-26 MED ORDER — GABAPENTIN 300 MG PO CAPS
ORAL_CAPSULE | ORAL | Status: AC
  Filled 2018-02-26: qty 1

## 2018-02-26 MED ORDER — PROMETHAZINE HCL 25 MG/ML IJ SOLN
6.2500 mg | INTRAMUSCULAR | Status: DC | PRN
  Administered 2018-02-26: 6.25 mg via INTRAVENOUS

## 2018-02-26 MED ORDER — BUPIVACAINE-EPINEPHRINE (PF) 0.5% -1:200000 IJ SOLN
INTRAMUSCULAR | Status: DC | PRN
  Administered 2018-02-26: 10 mL

## 2018-02-26 MED ORDER — LIDOCAINE HCL (CARDIAC) 20 MG/ML IV SOLN
INTRAVENOUS | Status: DC | PRN
  Administered 2018-02-26: 60 mg via INTRAVENOUS

## 2018-02-26 MED ORDER — VANCOMYCIN HCL IN DEXTROSE 500-5 MG/100ML-% IV SOLN
INTRAVENOUS | Status: AC
  Filled 2018-02-26: qty 100

## 2018-02-26 MED ORDER — FENTANYL CITRATE (PF) 100 MCG/2ML IJ SOLN
50.0000 ug | INTRAMUSCULAR | Status: DC | PRN
  Administered 2018-02-26: 100 ug via INTRAVENOUS

## 2018-02-26 MED ORDER — VANCOMYCIN HCL 10 G IV SOLR: 1500.0000 mg | INTRAVENOUS | Status: DC

## 2018-02-26 MED ORDER — SOD CITRATE-CITRIC ACID 500-334 MG/5ML PO SOLN: 30.0000 mL | Freq: Once | ORAL | Status: DC

## 2018-02-26 MED ORDER — ACETAMINOPHEN 500 MG PO TABS
1000.0000 mg | ORAL_TABLET | Freq: Once | ORAL | Status: AC
  Administered 2018-02-26: 1000 mg via ORAL

## 2018-02-26 MED ORDER — LIDOCAINE HCL (CARDIAC) 20 MG/ML IV SOLN
INTRAVENOUS | Status: AC
  Filled 2018-02-26: qty 5

## 2018-02-26 MED ORDER — FENTANYL CITRATE (PF) 100 MCG/2ML IJ SOLN: 25.0000 ug | INTRAMUSCULAR | Status: DC | PRN

## 2018-02-26 MED ORDER — GABAPENTIN 300 MG PO CAPS
300.0000 mg | ORAL_CAPSULE | ORAL | Status: AC
  Administered 2018-02-26: 300 mg via ORAL

## 2018-02-26 MED ORDER — BUPIVACAINE-EPINEPHRINE (PF) 0.5% -1:200000 IJ SOLN
INTRAMUSCULAR | Status: AC
  Filled 2018-02-26: qty 30

## 2018-02-26 MED ORDER — DEXAMETHASONE SODIUM PHOSPHATE 10 MG/ML IJ SOLN
INTRAMUSCULAR | Status: DC | PRN
  Administered 2018-02-26: 10 mg via INTRAVENOUS

## 2018-02-26 MED ORDER — ONDANSETRON HCL 4 MG/2ML IJ SOLN
INTRAMUSCULAR | Status: DC | PRN
  Administered 2018-02-26: 4 mg via INTRAVENOUS

## 2018-02-26 MED ORDER — FAMOTIDINE 20 MG PO TABS
ORAL_TABLET | ORAL | Status: AC
Start: 2018-02-26 — End: 2018-02-26
  Filled 2018-02-26: qty 1

## 2018-02-26 MED ORDER — PROPOFOL 10 MG/ML IV BOLUS
INTRAVENOUS | Status: DC | PRN
  Administered 2018-02-26: 200 mg via INTRAVENOUS

## 2018-02-26 MED ORDER — MIDAZOLAM HCL 2 MG/2ML IJ SOLN
INTRAMUSCULAR | Status: AC
  Filled 2018-02-26: qty 2

## 2018-02-26 MED ORDER — SCOPOLAMINE 1 MG/3DAYS TD PT72: 1.0000 | MEDICATED_PATCH | Freq: Once | TRANSDERMAL | Status: DC | PRN

## 2018-02-26 MED ORDER — KETOROLAC TROMETHAMINE 30 MG/ML IJ SOLN
INTRAMUSCULAR | Status: AC
  Filled 2018-02-26: qty 1

## 2018-02-26 MED ORDER — DEXMEDETOMIDINE HCL IN NACL 200 MCG/50ML IV SOLN
INTRAVENOUS | Status: DC | PRN
  Administered 2018-02-26: 20 ug via INTRAVENOUS

## 2018-02-26 MED ORDER — FENTANYL CITRATE (PF) 100 MCG/2ML IJ SOLN
INTRAMUSCULAR | Status: AC
  Filled 2018-02-26: qty 2

## 2018-02-26 MED ORDER — VANCOMYCIN HCL IN DEXTROSE 1-5 GM/200ML-% IV SOLN
INTRAVENOUS | Status: AC
  Filled 2018-02-26: qty 200

## 2018-02-26 MED ORDER — ACETAMINOPHEN 500 MG PO TABS: 1000.0000 mg | ORAL_TABLET | ORAL | Status: DC

## 2018-02-26 MED ORDER — VANCOMYCIN HCL 1000 MG IV SOLR
INTRAVENOUS | Status: DC | PRN
  Administered 2018-02-26: 1000 mg via INTRAVENOUS

## 2018-02-26 MED ORDER — DEXMEDETOMIDINE HCL IN NACL 200 MCG/50ML IV SOLN
INTRAVENOUS | Status: AC
  Filled 2018-02-26: qty 50

## 2018-02-26 MED ORDER — KETOROLAC TROMETHAMINE 30 MG/ML IJ SOLN
30.0000 mg | Freq: Once | INTRAMUSCULAR | Status: AC
Start: 2018-02-26 — End: 2018-02-26
  Administered 2018-02-26: 30 mg via INTRAVENOUS

## 2018-02-26 MED ORDER — ACETAMINOPHEN 500 MG PO TABS
ORAL_TABLET | ORAL | Status: AC
  Filled 2018-02-26: qty 2

## 2018-02-26 MED ORDER — PROPOFOL 10 MG/ML IV BOLUS
INTRAVENOUS | Status: AC
Start: 2018-02-26 — End: 2018-02-26
  Filled 2018-02-26: qty 40

## 2018-02-26 MED ORDER — DEXAMETHASONE SODIUM PHOSPHATE 10 MG/ML IJ SOLN
INTRAMUSCULAR | Status: AC
Start: 2018-02-26 — End: 2018-02-26
  Filled 2018-02-26: qty 1

## 2018-02-26 MED ORDER — FAMOTIDINE IN NACL 20-0.9 MG/50ML-% IV SOLN: 20.0000 mg | Freq: Once | INTRAVENOUS | Status: DC

## 2018-02-26 MED ORDER — SODIUM CHLORIDE 0.9 % IJ SOLN
INTRAMUSCULAR | Status: AC
  Filled 2018-02-26: qty 10

## 2018-02-26 MED ORDER — PROMETHAZINE HCL 25 MG/ML IJ SOLN
INTRAMUSCULAR | Status: AC
  Filled 2018-02-26: qty 1

## 2018-02-26 MED ORDER — PROMETHAZINE HCL 25 MG PO TABS: 25.0000 mg | ORAL_TABLET | Freq: Four times a day (QID) | ORAL | 0 refills | Status: DC | PRN

## 2018-02-26 SURGICAL SUPPLY — 43 items
BLADE HEX COATED 2.75 (ELECTRODE) ×3 IMPLANT
BLADE SURG 15 STRL LF DISP TIS (BLADE) ×1 IMPLANT
BLADE SURG 15 STRL SS (BLADE) ×2
CANISTER SUCT 1200ML W/VALVE (MISCELLANEOUS) ×3 IMPLANT
CHLORAPREP W/TINT 26ML (MISCELLANEOUS) ×3 IMPLANT
CLIP VESOCCLUDE SM WIDE 6/CT (CLIP) IMPLANT
COVER BACK TABLE 60X90IN (DRAPES) ×3 IMPLANT
COVER MAYO STAND STRL (DRAPES) ×3 IMPLANT
DECANTER SPIKE VIAL GLASS SM (MISCELLANEOUS) IMPLANT
DERMABOND ADVANCED (GAUZE/BANDAGES/DRESSINGS) ×2
DERMABOND ADVANCED .7 DNX12 (GAUZE/BANDAGES/DRESSINGS) ×1 IMPLANT
DEVICE DUBIN W/COMP PLATE 8390 (MISCELLANEOUS) IMPLANT
DRAPE LAPAROTOMY 100X72 PEDS (DRAPES) ×3 IMPLANT
DRAPE UTILITY XL STRL (DRAPES) ×3 IMPLANT
ELECT REM PT RETURN 9FT ADLT (ELECTROSURGICAL) ×3
ELECTRODE REM PT RTRN 9FT ADLT (ELECTROSURGICAL) ×1 IMPLANT
GAUZE SPONGE 4X4 12PLY STRL (GAUZE/BANDAGES/DRESSINGS) ×3 IMPLANT
GLOVE BIO SURGEON STRL SZ7 (GLOVE) IMPLANT
GLOVE BIOGEL PI IND STRL 7.0 (GLOVE) ×2 IMPLANT
GLOVE BIOGEL PI INDICATOR 7.0 (GLOVE) ×4
GLOVE SURG SIGNA 7.5 PF LTX (GLOVE) IMPLANT
GLOVE SURG SS PI 6.5 STRL IVOR (GLOVE) ×3 IMPLANT
GLOVE SURG SS PI 7.5 STRL IVOR (GLOVE) ×3 IMPLANT
GOWN STRL REUS W/ TWL LRG LVL3 (GOWN DISPOSABLE) ×1 IMPLANT
GOWN STRL REUS W/ TWL XL LVL3 (GOWN DISPOSABLE) ×1 IMPLANT
GOWN STRL REUS W/TWL LRG LVL3 (GOWN DISPOSABLE) ×2
GOWN STRL REUS W/TWL XL LVL3 (GOWN DISPOSABLE) ×2
KIT MARKER MARGIN INK (KITS) IMPLANT
NEEDLE HYPO 25X1 1.5 SAFETY (NEEDLE) ×3 IMPLANT
NS IRRIG 1000ML POUR BTL (IV SOLUTION) IMPLANT
PACK BASIN DAY SURGERY FS (CUSTOM PROCEDURE TRAY) ×3 IMPLANT
PENCIL BUTTON HOLSTER BLD 10FT (ELECTRODE) ×3 IMPLANT
SLEEVE SCD COMPRESS KNEE MED (MISCELLANEOUS) ×3 IMPLANT
SPONGE LAP 4X18 X RAY DECT (DISPOSABLE) ×3 IMPLANT
SUT MNCRL AB 4-0 PS2 18 (SUTURE) ×3 IMPLANT
SUT SILK 2 0 SH (SUTURE) IMPLANT
SUT VIC AB 3-0 SH 27 (SUTURE) ×2
SUT VIC AB 3-0 SH 27X BRD (SUTURE) ×1 IMPLANT
SYR CONTROL 10ML LL (SYRINGE) ×3 IMPLANT
TOWEL OR 17X24 6PK STRL BLUE (TOWEL DISPOSABLE) ×3 IMPLANT
TUBE CONNECTING 20'X1/4 (TUBING)
TUBE CONNECTING 20X1/4 (TUBING) IMPLANT
YANKAUER SUCT BULB TIP NO VENT (SUCTIONS) IMPLANT

## 2018-02-26 NOTE — Interval H&P Note (Signed)
History and Physical Interval Note:no change in H and P  02/26/2018 12:29 PM  Ashley [name]  has presented today for surgery, with the diagnosis of right breast cancer positive margins  The various methods of treatment have been discussed with the patient and family. After consideration of risks, benefits and other options for treatment, the patient has consented to  Procedure(s): RE-EXCISION OF BREAST CANCER,SUPERIOR MARGINS (Right) as a surgical intervention .  The patient's history has been reviewed, patient examined, no change in status, stable for surgery.  I have reviewed the patient's chart and labs.  Questions were answered to the patient's satisfaction.     [name] A

## 2018-02-26 NOTE — Anesthesia Preprocedure Evaluation (Addendum)
Anesthesia Evaluation  Patient identified by MRN, date of birth, ID band Patient awake    Reviewed: Allergy & Precautions, NPO status , Patient's Chart, lab work & pertinent test results  History of Anesthesia Complications (+) PONV and history of anesthetic complications  Airway Mallampati: II  TM Distance: >3 FB Neck ROM: Full    Dental  (+) Teeth Intact, Dental Advisory Given, Chipped,    Pulmonary former smoker,    Pulmonary exam normal breath sounds clear to auscultation       Cardiovascular negative cardio ROS Normal cardiovascular exam Rhythm:Regular Rate:Normal     Neuro/Psych  Headaches,    GI/Hepatic Neg liver ROS, GERD  Medicated,  Endo/Other  negative endocrine ROS  Renal/GU negative Renal ROS     Musculoskeletal negative musculoskeletal ROS (+)   Abdominal   Peds  Hematology negative hematology ROS (+)   Anesthesia Other Findings Day of surgery medications reviewed with the patient.  right breast cancer positive margins  Reproductive/Obstetrics                            Anesthesia Physical Anesthesia Plan  ASA: II  Anesthesia Plan: General   Post-op Pain Management:    Induction: Intravenous  PONV Risk Score and Plan: 4 or greater and Midazolam, Dexamethasone, Ondansetron, Diphenhydramine and TIVA  Airway Management Planned: LMA  Additional Equipment:   Intra-op Plan:   Post-operative Plan: Extubation in OR  Informed Consent: I have reviewed the patients History and Physical, chart, labs and discussed the procedure including the risks, benefits and alternatives for the proposed anesthesia with the patient or authorized representative who has indicated his/her understanding and acceptance.   Dental advisory given  Plan Discussed with: CRNA  Anesthesia Plan Comments:        Anesthesia Quick Evaluation

## 2018-02-26 NOTE — Discharge Instructions (Signed)
[facility] °Office Phone Number [phone] ° °BREAST BIOPSY/ PARTIAL MASTECTOMY: POST OP INSTRUCTIONS ° °Always review your discharge instruction sheet given to you by the facility where your surgery was performed. ° °IF YOU HAVE DISABILITY OR FAMILY LEAVE FORMS, YOU MUST BRING THEM TO THE OFFICE FOR PROCESSING.  DO NOT GIVE THEM TO YOUR DOCTOR. ° °1. A prescription for pain medication may be given to you upon discharge.  Take your pain medication as prescribed, if needed.  If narcotic pain medicine is not needed, then you may take acetaminophen (Tylenol) or ibuprofen (Advil) as needed. °2. Take your usually prescribed medications unless otherwise directed °3. If you need a refill on your pain medication, please contact your pharmacy.  They will contact our office to request authorization.  Prescriptions will not be filled after 5pm or on week-ends. °4. You should eat very light the first 24 hours after surgery, such as soup, crackers, pudding, etc.  Resume your normal diet the day after surgery. °5. Most patients will experience some swelling and bruising in the breast.  Ice packs and a good support bra will help.  Swelling and bruising can take several days to resolve.  °6. It is common to experience some constipation if taking pain medication after surgery.  Increasing fluid intake and taking a stool softener will usually help or prevent this problem from occurring.  A mild laxative (Milk of Magnesia or Miralax) should be taken according to package directions if there are no bowel movements after 48 hours. °7. Unless discharge instructions indicate otherwise, you may remove your bandages 24-48 hours after surgery, and you may shower at that time.  You may have steri-strips (small skin tapes) in place directly over the incision.  These strips should be left on the skin for 7-10 days.  If your surgeon used skin glue on the incision, you may shower in 24 hours.  The glue will flake off over the  next 2-3 weeks.  Any sutures or staples will be removed at the office during your follow-up visit. °8. ACTIVITIES:  You may resume regular daily activities (gradually increasing) beginning the next day.  Wearing a good support bra or sports bra minimizes pain and swelling.  You may have sexual intercourse when it is comfortable. °a. You may drive when you no longer are taking prescription pain medication, you can comfortably wear a seatbelt, and you can safely maneuver your car and apply brakes. °b. RETURN TO WORK:  ______________________________________________________________________________________ °9. You should see your doctor in the office for a follow-up appointment approximately two weeks after your surgery.  Your doctor’s nurse will typically make your follow-up appointment when she calls you with your pathology report.  Expect your pathology report 2-3 business days after your surgery.  You may call to check if you do not hear from us after three days. °10. OTHER INSTRUCTIONS: _______________________________________________________________________________________________ _____________________________________________________________________________________________________________________________________ °_____________________________________________________________________________________________________________________________________ °_____________________________________________________________________________________________________________________________________ ° °WHEN TO CALL YOUR DOCTOR: °1. Fever over 101.0 °2. Nausea and/or vomiting. °3. Extreme swelling or bruising. °4. Continued bleeding from incision. °5. Increased pain, redness, or drainage from the incision. ° °The clinic staff is available to answer your questions during regular business hours.  Please don’t hesitate to call and ask to speak to one of the nurses for clinical concerns.  If you have a medical emergency, go to the nearest  emergency room or call 911.  A surgeon from [facility] is always on call at the hospital. ° °For further questions, please visit [email or web address]  ° ° ° °  Post Anesthesia Home Care Instructions  Activity: Get plenty of rest for the remainder of the day. A responsible individual must stay with you for 24 hours following the procedure.  For the next 24 hours, DO NOT: -Drive a car -Paediatric nurse -Drink alcoholic beverages -Take any medication unless instructed by your physician -Make any legal decisions or sign important papers.  Meals: Start with liquid foods such as gelatin or soup. Progress to regular foods as tolerated. Avoid greasy, spicy, heavy foods. If nausea and/or vomiting occur, drink only clear liquids until the nausea and/or vomiting subsides. Call your physician if vomiting continues.  Special Instructions/Symptoms: Your throat may feel dry or sore from the anesthesia or the breathing tube placed in your throat during surgery. If this causes discomfort, gargle with warm salt water. The discomfort should disappear within 24 hours.  If you had a scopolamine patch placed behind your ear for the management of post- operative nausea and/or vomiting:  1. The medication in the patch is effective for 72 hours, after which it should be removed.  Wrap patch in a tissue and discard in the trash. Wash hands thoroughly with soap and water. 2. You may remove the patch earlier than 72 hours if you experience unpleasant side effects which may include dry mouth, dizziness or visual disturbances. 3. Avoid touching the patch. Wash your hands with soap and water after contact with the patch.    30 mg of Toradol given at 3:22 pm, No Ibuprofen until 9:30 pm

## 2018-02-26 NOTE — Transfer of Care (Signed)
Immediate Anesthesia Transfer of Care Note  Patient: Ashley [name]  Procedure(s) Performed: RE-EXCISION OF BREAST CANCER,SUPERIOR MARGINS (Right Breast)  Patient Location: PACU  Anesthesia Type:General  Level of Consciousness: awake, sedated and patient cooperative  Airway & Oxygen Therapy: Patient Spontanous Breathing and Patient connected to face mask oxygen  Post-op Assessment: Report given to RN and Post -op Vital signs reviewed and stable  Post vital signs: Reviewed and stable  Last Vitals:  Vitals Value Taken Time  BP    Temp    Pulse 71 02/26/2018  2:22 PM  Resp 16 02/26/2018  2:22 PM  SpO2 100 % 02/26/2018  2:22 PM  Vitals shown include unvalidated device data.  Last Pain:  Vitals:   02/26/18 1225  TempSrc: Oral  PainSc: 0-No pain         Complications: No apparent anesthesia complications

## 2018-02-26 NOTE — Op Note (Signed)
RE-EXCISION OF BREAST CANCER,SUPERIOR MARGINS  Procedure Note  Ashley [name] 02/26/2018   Pre-op Diagnosis: right breast cancer positive margins     Post-op Diagnosis: same  Procedure(s): RE-EXCISION OF BREAST CANCER,SUPERIOR MARGINS  Surgeon(s): Coralie Keens, MD  Anesthesia: General  Staff:  Circulator: Lynelle Doctor, RN Scrub Person: Silvio Clayman, CST  Estimated Blood Loss: Minimal               Specimens: sent to path  Procedure: The patient was brought to the operating room and identified as the correct patient.  She was placed supine on the operating table and general anesthesia was induced.  Her right breast was then prepped and draped in the usual sterile fashion.  I anesthetized the previous incision with Marcaine.  I then opened up the incision with [name] scalpel and went down to the partial mastectomy cavity.  The seroma fluid was then aspirated.  I then excised her superior margin going all the way down to the chest wall and sent this to pathology.  I then likewise took the inferior lateral margin and sent this as well.  I cannot take any further posterior margin as we were already down to the chest wall.  I then irrigated the cavity with saline.  I achieved hemostasis with the cautery.  I placed several surgical clips into the cavity.  I then closed the subtenons tissue with interrupted 3-0 Vicryl sutures and closed the skin with [name] running 4-0 Monocryl.  Dermabond was then applied.  The patient tolerated the procedure well.  All the counts were correct at the end the procedure.  The patient was then extubated in the operating room and taken in [name] stable condition to the recovery room.          Ashley [name]   Date: 02/26/2018  Time: 2:18 PM

## 2018-02-26 NOTE — Anesthesia Procedure Notes (Signed)
Procedure Name: LMA Insertion Performed by: Verita Lamb, CRNA Pre-anesthesia Checklist: Emergency Drugs available, Patient identified, Suction available, Patient being monitored and Timeout performed Oxygen Delivery Method: Circle system utilized Preoxygenation: Pre-oxygenation with 100% oxygen Induction Type: IV induction LMA: LMA inserted LMA Size: 4.0 Tube type: Oral Number of attempts: 1 Placement Confirmation: positive ETCO2,  breath sounds checked- equal and bilateral and CO2 detector Tube secured with: Tape Dental Injury: Teeth and Oropharynx as per pre-operative assessment

## 2018-02-27 ENCOUNTER — Telehealth: Payer: Self-pay | Admitting: Hematology and Oncology

## 2018-02-27 ENCOUNTER — Encounter (HOSPITAL_BASED_OUTPATIENT_CLINIC_OR_DEPARTMENT_OTHER): Payer: Self-pay | Admitting: Surgery

## 2018-02-27 NOTE — Anesthesia Postprocedure Evaluation (Signed)
Anesthesia Post Note  Patient: [name]  Procedure(s) Performed: RE-EXCISION OF BREAST CANCER,SUPERIOR MARGINS (Right Breast)     Patient location during evaluation: PACU Anesthesia Type: General Level of consciousness: awake and alert Pain management: pain level controlled Vital Signs Assessment: post-procedure vital signs reviewed and stable Respiratory status: spontaneous breathing, nonlabored ventilation and respiratory function stable Cardiovascular status: blood pressure returned to baseline and stable Postop Assessment: no apparent nausea or vomiting Anesthetic complications: no    Last Vitals:  Vitals:   02/26/18 1630 02/26/18 1645  BP: (!) 162/99 (!) 136/91  Pulse: 100 80  Resp: 14 14  Temp: 36.6 C   SpO2: 98% 100%    Last Pain:  Vitals:   02/26/18 1645  TempSrc:   PainSc: 0-No pain                 Catalina Gravel

## 2018-02-27 NOTE — Telephone Encounter (Signed)
Appt has been moved for the pt to see Dr. Lindi Adie on 4/3 at 345pm. Pt has agreed to the appt date and time.

## 2018-02-28 NOTE — Progress Notes (Signed)
Location of Breast Cancer: Right Breast  Histology per Pathology Report:  01/29/18 Diagnosis Breast, right, needle core biopsy, 6:30 8 mm mass - DUCTAL CARCINOMA IN SITU (DCIS), INTERMEDIATE GRADE, INVOLVING A PAPILLOMA. SEE NOTE - NEGATIVE FOR INVASIVE CARCINOMA  Receptor Status: ER(90%), PR (70%)  02/18/18 Diagnosis 1. Breast, lumpectomy, Right Partial w/ seed - HIGH GRADE DUCTAL CARCINOMA IN SITU WITH NECROSIS, SEE COMMENT. - IN SITU CARCINOMA IS PRESENT AT THE SUPERIOR MARGIN BROADLY AND <0.1 CM OF THE FINAL INFERIOR POSTERIOLATERAL MARGIN MULTIFOCALLY (PART #2). - INTRADUCTAL PAPILLOMA WITH PARTIAL INVOLVEMENT BY HIGH GRADE DUCTAL CARCINOMA IN SITU. - BIOPSY SITES. - SEE ONCOLOGY TABLE. 2. Breast, excision, Right further inferior posteriolateral margin - HIGH GRADE DUCTAL CARCINOMA IN SITU. - IN SITU CARCINOMA IS <0.1 CM OF THE FINAL INFERIOR POSTERIOLATERAL MARGIN MULTIFOCALLY.  02/26/18 Re-excision Right Breast  Did patient present with symptoms or was this found on screening mammography?: It was found on a screening mammogram.   Past/Anticipated interventions by surgeon, if any: 02/19/18 Procedure(s): RADIOACTIVE SEED GUIDED RIGHT BREAST PARTIAL MASTECTOMY Surgeon(s): Coralie Keens, MD  02/26/18  Procedure(s): RE-EXCISION OF BREAST CANCER,SUPERIOR MARGINS Surgeon(s): Coralie Keens, MD  Past/Anticipated interventions by medical oncology, if any: 03/06/18 at 3:45 with Dr. Lindi Adie  Lymphedema issues, if any: She denies   Pain issues, if any:  She has pain to her recent surgical site to her Right Nipple.   SAFETY ISSUES:  Prior radiation? No  Pacemaker/ICD? No  Possible current pregnancy? She had a negative pregnancy test on 02/18/18. She is currently not having intercourse. She is taking [place] birth control pills currently.   Is the patient on methotrexate? No  Current Complaints / other details:    BP (!) 146/91   Pulse 86   Temp 98 F (36.7 C)    Resp 20   Ht 5\' 4"  (1.626 m)   Wt 138 lb 3.2 oz (62.7 kg)   SpO2 100% Comment: room air  BMI 23.72 kg/m    Wt Readings from Last 3 Encounters:  03/06/18 138 lb 3.2 oz (62.7 kg)  02/26/18 138 lb (62.6 kg)  02/18/18 142 lb (64.4 kg)      Antonia Jicha, Stephani Police, RN 02/28/2018,1:04 PM

## 2018-03-06 ENCOUNTER — Ambulatory Visit
Admission: RE | Admit: 2018-03-06 | Discharge: 2018-03-06 | Disposition: A | Discharge status: Home / Self Care | Payer: BLUE CROSS/BLUE SHIELD | Source: Ambulatory Visit | Attending: Radiation Oncology | Admitting: Radiation Oncology

## 2018-03-06 ENCOUNTER — Other Ambulatory Visit: Payer: Self-pay

## 2018-03-06 ENCOUNTER — Encounter: Payer: Self-pay | Admitting: Radiation Oncology

## 2018-03-06 ENCOUNTER — Inpatient Hospital Stay: Payer: BLUE CROSS/BLUE SHIELD | Attending: Hematology and Oncology | Admitting: Hematology and Oncology

## 2018-03-06 ENCOUNTER — Encounter: Payer: Self-pay | Admitting: Hematology and Oncology

## 2018-03-06 VITALS — BP 146/91 | HR 86 | Temp 98.0°F | Resp 20 | Ht 64.0 in | Wt 138.2 lb

## 2018-03-06 DIAGNOSIS — Z886 Allergy status to analgesic agent status: Secondary | ICD-10-CM | POA: Insufficient documentation

## 2018-03-06 DIAGNOSIS — Z881 Allergy status to other antibiotic agents status: Secondary | ICD-10-CM | POA: Diagnosis not present

## 2018-03-06 DIAGNOSIS — G8929 Other chronic pain: Secondary | ICD-10-CM | POA: Diagnosis not present

## 2018-03-06 DIAGNOSIS — Z880 Allergy status to penicillin: Secondary | ICD-10-CM | POA: Diagnosis not present

## 2018-03-06 DIAGNOSIS — Z170 Estrogen receptor positive status [ER+]: Secondary | ICD-10-CM | POA: Diagnosis not present

## 2018-03-06 DIAGNOSIS — Z87891 Personal history of nicotine dependence: Secondary | ICD-10-CM | POA: Insufficient documentation

## 2018-03-06 DIAGNOSIS — Z8249 Family history of ischemic heart disease and other diseases of the circulatory system: Secondary | ICD-10-CM | POA: Insufficient documentation

## 2018-03-06 DIAGNOSIS — Z9889 Other specified postprocedural states: Secondary | ICD-10-CM | POA: Insufficient documentation

## 2018-03-06 DIAGNOSIS — Z79891 Long term (current) use of opiate analgesic: Secondary | ICD-10-CM | POA: Diagnosis not present

## 2018-03-06 DIAGNOSIS — Z833 Family history of diabetes mellitus: Secondary | ICD-10-CM | POA: Insufficient documentation

## 2018-03-06 DIAGNOSIS — Z79899 Other long term (current) drug therapy: Secondary | ICD-10-CM | POA: Diagnosis not present

## 2018-03-06 DIAGNOSIS — Z885 Allergy status to narcotic agent status: Secondary | ICD-10-CM | POA: Insufficient documentation

## 2018-03-06 DIAGNOSIS — R197 Diarrhea, unspecified: Secondary | ICD-10-CM | POA: Insufficient documentation

## 2018-03-06 DIAGNOSIS — R21 Rash and other nonspecific skin eruption: Secondary | ICD-10-CM | POA: Diagnosis not present

## 2018-03-06 DIAGNOSIS — D0511 Intraductal carcinoma in situ of right breast: Secondary | ICD-10-CM | POA: Diagnosis not present

## 2018-03-06 DIAGNOSIS — Z803 Family history of malignant neoplasm of breast: Secondary | ICD-10-CM | POA: Insufficient documentation

## 2018-03-06 DIAGNOSIS — Z882 Allergy status to sulfonamides status: Secondary | ICD-10-CM | POA: Diagnosis not present

## 2018-03-06 DIAGNOSIS — Z791 Long term (current) use of non-steroidal anti-inflammatories (NSAID): Secondary | ICD-10-CM | POA: Insufficient documentation

## 2018-03-06 DIAGNOSIS — Z888 Allergy status to other drugs, medicaments and biological substances status: Secondary | ICD-10-CM | POA: Diagnosis not present

## 2018-03-06 HISTORY — DX: Intraductal carcinoma in situ of right breast: D05.11

## 2018-03-06 NOTE — Progress Notes (Signed)
Radiation Oncology         [phone] ________________________________  Initial outpatient Consultation  Name: Ashley [name] MRN: [id number]  Date: 03/06/2018  DOB: 09/30/1976  OE:[id number], Cammie Mcgee, MD  Coralie Keens, MD   REFERRING PHYSICIAN: Coralie Keens, MD  DIAGNOSIS:    ICD-10-CM   1. Ductal carcinoma in situ (DCIS) of right breast D05.11 Ambulatory referral to Social Work   Stage 0, Tis, N0M0 Right Breast LOQ DCIS, ER (+) / PR (+), High grade with necrosis  Pathologic stage: pTis, pNX  CHIEF COMPLAINT: Here to discuss management of right breast cancer  HISTORY OF PRESENT ILLNESS::Ashley [name] is a 42 y.o. female who presented initially for a routine screening mammogram. She is accompanied by a friend today.   She had a screening mammogram on 01/21/2018 with results revealing: Breast density category C. Further evaluation is suggested for possible mass in the right breast..   She then proceeded to a diagnostic mammogram and ultrasonography on 01/22/2018 with results revealing: Breast density category C. Probably benign 0.8 cm circumscribed mass at 6:30 position in the right breast. Most likely etiologies include fibroadenoma or complicated cyst.  She had a right needle core biopsy completed on 01/29/2018 with results of: Breast, right, needle core biopsy, 6:30 8 mm mass with ductal carcinoma in situ (DCIS), intermediate grade, involving a papilloma, negative for invasive carcinoma. Prognostic indicators significant for: ER, 90% positive and PR, 70% positive, both with moderate staining intensity.   She had a right lumpectomy completed on 02/18/2018 with results revealing: Breast, lumpectomy, Right Partial w/ seed with high grade ductal carcinoma in situ with necrosis. In situ carcinoma is present at the superior margin broadly and <0.1 cm of the final inferior posteriolateral margin multifocally (part #2). Intraductal papilloma with partial involvement by high grade  ductal carcinoma in situ. Biopsy sites. Breast, excision, Right further inferior posteriolateral margin with high grade ductal carcinoma in situ. In situ carcinoma is <0.1 cm of the final inferior posteriolateral margin multifocally.   She also had right breast excision completed on 02/26/2018 with results of: Breast, excision, right new superior margin with high-grade ductal carcinoma in situ with necrosis. Largest focus measures 0.2 cm. Negative for invasive carcinoma. Surgical resection margins are negative for DCIS. Background breast with fibrocystic change. Breast, excision, right new inferior lateral margin with high-grade ductal carcinoma in situ (DCIS) with necrosis. Largest focus measures 1.5 cm. DCIS is 0.1 cm from the new inferior-lateral margin. Negative for invasive carcinoma. Background breast with fibrocystic change.   She was discussed at tumor board this morning and the consensus was that she needed further excision of her margins. In tumor board, there was concern that her margins were still positive or very very close.   According to Dr. Trevor Mace operative note, he, during her first re-excision, excised her superior margin all the way down to chest wall. He also excised the inferior lateral margin, but didn't comment on taking this to chest wall. He couldn't take any further posterior margin, because he had already gone down to chest wall.   She has an appointment with Dr. Lindi Adie later this afternoon to discuss anti-estrogens.   On review of systems, she reports stomach virus (vomiting, diarrhea) and rash treated with benadryl last night. She denies any other symptoms. She reports that her grandmother passed from breast cancer, which is concerning for the patient. She is still taking birth control at this time.     PREVIOUS RADIATION THERAPY: No  PAST  MEDICAL HISTORY:  has a past medical history of Carpal tunnel syndrome of right wrist, Chronic back pain, Complication of  anesthesia, Ductal carcinoma in situ (DCIS) of right breast (03/06/2018), Migraine, Pinched nerve in neck, PONV (postoperative nausea and vomiting), and Sinus congestion.    PAST SURGICAL HISTORY: Past Surgical History:  Procedure Laterality Date  . BREAST LUMPECTOMY WITH RADIOACTIVE SEED LOCALIZATION Right 02/18/2018   Procedure: RADIOACTIVE SEED GUIDED RIGHT BREAST PARTIAL MASTECTOMY;  Surgeon: Coralie Keens, MD;  Location: [facility];  Service: General;  Laterality: Right;  . FRACTURE SURGERY    . RE-EXCISION OF BREAST CANCER,SUPERIOR MARGINS Right 02/26/2018   Procedure: RE-EXCISION OF BREAST CANCER,SUPERIOR MARGINS;  Surgeon: Coralie Keens, MD;  Location: [facility];  Service: General;  Laterality: Right;  . SINUS ENDO W/FUSION     [facility]    FAMILY HISTORY: family history includes Breast cancer in her maternal grandmother; Diabetes in her father; Hypertension in her father and mother.  SOCIAL HISTORY:  reports that she quit smoking about 14 years ago. She has never used smokeless tobacco. She reports that she does not drink alcohol or use drugs.  ALLERGIES: Prednisone; Aspirin; Bactrim [sulfamethoxazole-trimethoprim]; Biaxin [clarithromycin]; Ciprofloxacin; Diphenhydramine hcl; Latex; Levaquin [levofloxacin in d5w]; Penicillins; and Vicodin [hydrocodone-acetaminophen]  MEDICATIONS:  Current Outpatient Medications  Medication Sig Dispense Refill  . acetaminophen (TYLENOL) 650 MG CR tablet Take 1,300 mg by mouth every 8 (eight) hours as needed for pain.    Marland Kitchen ibuprofen (ADVIL,MOTRIN) 800 MG tablet Take 1 tablet (800 mg total) by mouth every 8 (eight) hours as needed for moderate pain. 21 tablet 0  . levonorgestrel-ethinyl estradiol (JOLESSA) 0.15-0.03 MG tablet Take 1 tablet by mouth daily. 90 tablet 1  . omeprazole (PRILOSEC) 40 MG capsule TAKE 1 CAPSULE BY MOUTH ONCE DAILY 90 capsule 1  . promethazine (PHENERGAN) 25 MG tablet Take 1  tablet (25 mg total) by mouth every 6 (six) hours as needed for nausea or vomiting. 20 tablet 0  . propranolol (INDERAL) 10 MG tablet Take 1 tablet (10 mg total) at bedtime by mouth. 30 tablet 0  . rizatriptan (MAXALT) 5 MG tablet Take 1 tablet (5 mg total) by mouth as needed for migraine. May repeat in 2 hours if needed (Patient taking differently: Take 10 mg by mouth every 2 (two) hours as needed for migraine. May repeat in 2 hours if needed) 10 tablet 0  . sodium chloride (OCEAN) 0.65 % SOLN nasal spray Place 1 spray into both nostrils as needed for congestion.    . mometasone (NASONEX) 50 MCG/ACT nasal spray Place 2 sprays into the nose 2 (two) times daily. (Patient not taking: Reported on 03/06/2018) 17 g 12  . traMADol (ULTRAM) 50 MG tablet Take 1-2 tablets (50-100 mg total) by mouth every 6 (six) hours as needed. (Patient not taking: Reported on 03/06/2018) 20 tablet 0   No current facility-administered medications for this encounter.     REVIEW OF SYSTEMS: As above  PHYSICAL EXAM:  height is '5\' 4"'  (1.626 m) and weight is 138 lb 3.2 oz (62.7 kg). Her temperature is 98 F (36.7 C). Her blood pressure is 146/91 (abnormal) and her pulse is 86. Her respiration is 20 and oxygen saturation is 100%.   General: Alert and oriented, tearful Skin: She has a slightly erythematous rash over her torso and upper abdomen.  Psychiatric: Judgment and insight are intact. Affect is tearful Breasts: Underneath the right nipple, she has a healing scar from the  excision.   ECOG = 0  0 - Asymptomatic (Fully active, able to carry on all predisease activities without restriction)  1 - Symptomatic but completely ambulatory (Restricted in physically strenuous activity but ambulatory and able to carry out work of a light or sedentary nature. For example, light housework, office work)  2 - Symptomatic, <50% in bed during the day (Ambulatory and capable of all self care but unable to carry out any work activities. Up  and about more than 50% of waking hours)  3 - Symptomatic, >50% in bed, but not bedbound (Capable of only limited self-care, confined to bed or chair 50% or more of waking hours)  4 - Bedbound (Completely disabled. Cannot carry on any self-care. Totally confined to bed or chair)  5 - Death   Eustace Pen MM, Creech RH, Tormey DC, et al. [phone]). "Toxicity and response criteria of the [facility]". [place] Oncol. 5 (6): 649-55   LABORATORY DATA:  Lab Results  Component Value Date   WBC 7.7 02/13/2018   HGB 13.0 02/13/2018   HCT 38.9 02/13/2018   MCV 93.3 02/13/2018   PLT 205 02/13/2018   CMP     Component Value Date/Time   NA 141 02/13/2018 1600   K 3.7 02/13/2018 1600   CL 111 02/13/2018 1600   CO2 22 02/13/2018 1600   GLUCOSE 89 02/13/2018 1600   BUN 8 02/13/2018 1600   CREATININE 0.73 02/13/2018 1600   CREATININE 0.78 08/07/2015 1148   CALCIUM 9.0 02/13/2018 1600   PROT 6.9 08/07/2015 1148   ALBUMIN 4.1 08/07/2015 1148   AST 12 08/07/2015 1148   ALT 14 08/07/2015 1148   ALKPHOS 30 (L) 08/07/2015 1148   BILITOT 0.6 08/07/2015 1148   GFRNONAA >60 02/13/2018 1600   GFRNONAA >89 05/13/2013 1250   GFRAA >60 02/13/2018 1600   GFRAA >89 05/13/2013 1250         RADIOGRAPHY: Mm Breast Surgical Specimen  Result Date: 02/18/2018 CLINICAL DATA:  Status post excision of a right breast lesion following radioactive seed localization. EXAM: SPECIMEN RADIOGRAPH OF THE RIGHT BREAST COMPARISON:  Previous exam(s). FINDINGS: Status post excision of the right breast. The radioactive seed and biopsy marker clip are present, completely intact, and were marked for pathology. IMPRESSION: Specimen radiograph of the right breast. Electronically Signed   By: Lajean Manes M.D.   On: 02/18/2018 12:37   Mm Rt Radioactive Seed Loc Mammo Guide  Result Date: 02/15/2018 CLINICAL DATA:  Biopsy proven intermediate-grade DCIS involving a papilloma in the right breast. EXAM:  MAMMOGRAPHIC GUIDED RADIOACTIVE SEED LOCALIZATION OF THE RIGHT BREAST COMPARISON:  Previous exam(s). FINDINGS: Patient presents for radioactive seed localization prior to surgery. I met with the patient and we discussed the procedure of seed localization including benefits and alternatives. We discussed the high likelihood of a successful procedure. We discussed the risks of the procedure including infection, bleeding, tissue injury and further surgery. We discussed the low dose of radioactivity involved in the procedure. Informed, written consent was given. The usual time-out protocol was performed immediately prior to the procedure. Using mammographic guidance, sterile technique, 1% lidocaine and an I-125 radioactive seed, the ribbon shaped clip was localized using a lateral to medial approach. The follow-up mammogram images confirm the seed in the expected location and were marked for Dr. Ninfa Linden. Follow-up survey of the patient confirms presence of the radioactive seed. Order number of I-125 seed:  [id number]. Total activity:  5.498 millicuries reference Date: 02/12/2018 The  patient tolerated the procedure well and was released from the [facility]. She was given instructions regarding seed removal. IMPRESSION: Radioactive seed localization right breast. No apparent complications. Electronically Signed   By: Lillia Mountain M.D.   On: 02/15/2018 15:13      IMPRESSION/PLAN:  DCIS of right breast.  Extent of disease was not captured on mammogram.  Margins are questionable post-re-excision.    It was a pleasure meeting the patient today. I discussed with the patient regarding her diagnosis and recent pathology findings. I also discussed with the patient regarding the suggestions made at the tumor board meeting this morning.  I spoke with Dr. Ninfa Linden during this visit regarding the patient's recent pathology report, who will follow up with pathology for further detail regarding her pathology report and  radiology to determine if a MRI would be pertinent for determination of extend of disease prior to discussion of further surgery if needed with the patient.   A thorough discussion was had with the patient and her friend today regarding several treatment options, consistent of: obtaining clear margins via surgery along with radiation or  mastectomy +/- reconstruction and low likelihood of radiotherapy.   Additionally, I also spoke with the patient's fiancee, via phone call to discuss the patient's various treatment options. This was per request of the patient, during the visit.  In the meantime, our team will obtain a genetics appointment to determine if the cause of the patient's diagnosis was genetically caused, to which the patient agrees to obtaining.   I look forward to participating in the patient's care should she choose to undergo radiation therapy. I will await referral back to me for postoperative follow up and eventual CT simulation/treatment planning.   Advised to discuss an alternate form of birth control with Dr Lindi Adie today, given her ER+ DCIS  I spent 55 minutes face to face with the patient and more than 50% of that time was spent in counseling and/or coordination of care.   __________________________________________   Eppie Gibson, MD   This document serves as a record of services personally performed by Eppie Gibson, MD. It was created on her behalf by Steva Colder, a trained medical scribe. The creation of this record is based on the scribe's personal observations and the provider's statements to them. This document has been checked and approved by the attending provider.

## 2018-03-06 NOTE — Assessment & Plan Note (Signed)
01/29/2018: Screening detected right breast mass retroareolar at 630 position 0.8 cm, status post lumpectomy and reexcision 0.8 cm high-grade DCIS with necrosis and intraductal papilloma ER 90%, PR 70%, Tis NX stage 0  Recommendation: 1.  If possible resection for clear margins given her young age  42. if resection is not possible then radiation  3.  Followed by adjuvant antiestrogen therapy with tamoxifen 20 mg daily times 10 years
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
## 2018-03-07 ENCOUNTER — Encounter: Payer: Self-pay | Admitting: General Practice

## 2018-03-07 ENCOUNTER — Encounter: Payer: Self-pay | Admitting: *Deleted

## 2018-03-07 ENCOUNTER — Telehealth: Payer: Self-pay | Admitting: *Deleted

## 2018-03-07 NOTE — Progress Notes (Signed)
[facility] Psychosocial Distress Screening Clinical Social Work  Clinical Social Work was referred by distress screening protocol.  The patient scored a 7 on the Psychosocial Distress Thermometer which indicates moderate distress. Clinical Social Worker Johnaton Sonneborn to assess for distress and other psychosocial needs. Patient at work, did not want to talk at this time.  Agreeable to mailed information packet.  States "I have a good support system, I really dont like to talk to strangers."  CSW will send packet w information re [place].    ONCBCN DISTRESS SCREENING 03/06/2018  Screening Type Initial Screening  Distress experienced in past week (1-10) 7  Emotional problem type Nervousness/Anxiety;Adjusting to illness  Physical Problem type Pain;Sleep/insomnia;Constipation/diarrhea    Clinical Social Worker follow up needed: No.  If yes, follow up plan:   Edwyna Shell, LCSW Clinical Social Worker Phone:  [phone]

## 2018-03-07 NOTE — Telephone Encounter (Signed)
Scheduled and confirmed genetics on 03/13/18 at 3pm. Denies further needs at this time.

## 2018-03-08 ENCOUNTER — Other Ambulatory Visit: Payer: Self-pay | Admitting: Family Medicine

## 2018-03-08 DIAGNOSIS — Z308 Encounter for other contraceptive management: Secondary | ICD-10-CM

## 2018-03-11 ENCOUNTER — Ambulatory Visit: Payer: BLUE CROSS/BLUE SHIELD | Admitting: Hematology and Oncology

## 2018-03-12 ENCOUNTER — Other Ambulatory Visit: Payer: Self-pay | Admitting: Surgery

## 2018-03-12 ENCOUNTER — Other Ambulatory Visit (INDEPENDENT_AMBULATORY_CARE_PROVIDER_SITE_OTHER): Payer: Self-pay | Admitting: Orthopaedic Surgery

## 2018-03-12 DIAGNOSIS — Z853 Personal history of malignant neoplasm of breast: Secondary | ICD-10-CM | POA: Diagnosis not present

## 2018-03-12 DIAGNOSIS — N939 Abnormal uterine and vaginal bleeding, unspecified: Secondary | ICD-10-CM | POA: Diagnosis not present

## 2018-03-12 DIAGNOSIS — N920 Excessive and frequent menstruation with regular cycle: Secondary | ICD-10-CM | POA: Diagnosis not present

## 2018-03-12 DIAGNOSIS — D0511 Intraductal carcinoma in situ of right breast: Secondary | ICD-10-CM

## 2018-03-13 ENCOUNTER — Inpatient Hospital Stay: Payer: BLUE CROSS/BLUE SHIELD

## 2018-03-13 ENCOUNTER — Inpatient Hospital Stay (HOSPITAL_BASED_OUTPATIENT_CLINIC_OR_DEPARTMENT_OTHER): Payer: BLUE CROSS/BLUE SHIELD | Admitting: Genetic Counselor

## 2018-03-13 ENCOUNTER — Encounter: Payer: Self-pay | Admitting: Genetic Counselor

## 2018-03-13 ENCOUNTER — Ambulatory Visit
Admission: RE | Admit: 2018-03-13 | Discharge: 2018-03-13 | Disposition: A | Discharge status: Home / Self Care | Payer: BLUE CROSS/BLUE SHIELD | Source: Ambulatory Visit | Attending: Surgery | Admitting: Surgery

## 2018-03-13 DIAGNOSIS — Z803 Family history of malignant neoplasm of breast: Secondary | ICD-10-CM | POA: Insufficient documentation

## 2018-03-13 DIAGNOSIS — D0511 Intraductal carcinoma in situ of right breast: Secondary | ICD-10-CM | POA: Diagnosis not present

## 2018-03-13 DIAGNOSIS — Z1379 Encounter for other screening for genetic and chromosomal anomalies: Secondary | ICD-10-CM

## 2018-03-13 MED ORDER — GADOBENATE DIMEGLUMINE 529 MG/ML IV SOLN
12.0000 mL | Freq: Once | INTRAVENOUS | Status: AC | PRN
  Administered 2018-03-13: 12 mL via INTRAVENOUS

## 2018-03-14 ENCOUNTER — Encounter: Payer: Self-pay | Admitting: Genetic Counselor

## 2018-03-14 ENCOUNTER — Ambulatory Visit: Payer: BLUE CROSS/BLUE SHIELD | Admitting: Family Medicine
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
## 2018-03-17 ENCOUNTER — Telehealth: Payer: Self-pay | Admitting: Family Medicine

## 2018-03-17 NOTE — Telephone Encounter (Signed)
Pt called after hours,with ongoing migraine is a chronic issue for her She has taken 2 maxalt, 1 this am, and around 4pm  Wanted to take another  Recommend she take phenergan and Ibuprofen 800mg  , plenty of water and rest instead of another triptan  She agrees to plan

## 2018-03-19 ENCOUNTER — Telehealth: Payer: Self-pay

## 2018-03-19 NOTE — Telephone Encounter (Signed)
Returned call to [facility] At Dr. Ophelia Charter office regarding patient having a Verdia Kuba IUD placed. Per Dr. Lindi Adie this is appropriate for this patient. Left VM and call back number if there are any further questions.  Cyndia Bent RN

## 2018-03-20 ENCOUNTER — Telehealth: Payer: Self-pay

## 2018-03-20 DIAGNOSIS — Z853 Personal history of malignant neoplasm of breast: Secondary | ICD-10-CM | POA: Diagnosis not present

## 2018-03-20 DIAGNOSIS — N924 Excessive bleeding in the premenopausal period: Secondary | ICD-10-CM | POA: Diagnosis not present

## 2018-03-20 NOTE — Telephone Encounter (Signed)
Returned pt call informing her genetic testing has not yet resulted.  Cyndia Bent RN

## 2018-03-21 DIAGNOSIS — Z30430 Encounter for insertion of intrauterine contraceptive device: Secondary | ICD-10-CM | POA: Diagnosis not present

## 2018-03-22 ENCOUNTER — Ambulatory Visit: Payer: Self-pay | Admitting: Genetic Counselor

## 2018-03-22 ENCOUNTER — Telehealth: Payer: Self-pay | Admitting: Genetic Counselor

## 2018-03-22 ENCOUNTER — Encounter: Payer: Self-pay | Admitting: Genetic Counselor

## 2018-03-22 DIAGNOSIS — D0511 Intraductal carcinoma in situ of right breast: Secondary | ICD-10-CM

## 2018-03-22 DIAGNOSIS — Z803 Family history of malignant neoplasm of breast: Secondary | ICD-10-CM

## 2018-03-22 DIAGNOSIS — Z1379 Encounter for other screening for genetic and chromosomal anomalies: Secondary | ICD-10-CM | POA: Insufficient documentation

## 2018-03-22 NOTE — Telephone Encounter (Signed)
Revealed negative genetic testing.  Discussed that we do not know why she has breast cancer or why there is cancer in the family. It could be due to a different gene that we are not testing, or maybe our current technology may not be able to pick something up.  It will be important for her to keep in contact with genetics to keep up with whether additional testing may be needed. 

## 2018-03-22 NOTE — Progress Notes (Signed)
HPI:  Ashley [name] was previously seen in the [facility] clinic due to a personal and family history of cancer and concerns regarding a hereditary predisposition to cancer. Please refer to our prior cancer genetics clinic note for more information regarding Ashley [name]'s medical, social and family histories, and our assessment and recommendations, at the time. Ashley [name] recent genetic test results were disclosed to her, as were recommendations warranted by these results. These results and recommendations are discussed in more detail below.  CANCER HISTORY:    Ductal carcinoma in situ (DCIS) of right breast   01/29/2018 Surgery    Reexcision of the margins: New superior margin high-grade DCIS largest focus 0.2 cm new right inferior margin: High-grade DCIS with necrosis largest focus 1.5 cm, 0.1 cm from the new inferior lateral margin      01/29/2018 Initial Diagnosis    Right needle biopsy 630 position: DCIS intermediate grade involving a papilloma, negative for invasive cancer, ER 90%, PR 70%, Tis N0 stage 0      02/18/2018 Surgery    Right lumpectomy: High-grade DCIS with necrosis, superior margin broadly positive and less than 0.1 cm to the inferior margin and posterolateral margins, intraductal papilloma involved by high-grade DCIS, inferior posterolateral margin high-grade DCIS less than 0.1 cm multifocally, ER 90%, PR 70%, lymph nodes negative, Tis and 0 stage I      03/21/2018 Genetic Testing    Negative genetic testing on the STAT and common hereditary cancer panel.  The Hereditary Gene Panel offered by [facility] includes sequencing and/or deletion duplication testing of the following 47 genes: APC, ATM, AXIN2, BARD1, BMPR1A, BRCA1, BRCA2, BRIP1, CDH1, CDK4, CDKN2A (p14ARF), CDKN2A (p16INK4a), CHEK2, CTNNA1, DICER1, EPCAM (Deletion/duplication testing only), GREM1 (promoter region deletion/duplication testing only), KIT, MEN1, MLH1, MSH2, MSH3, MSH6, MUTYH, NBN, NF1, NHTL1, PALB2,  PDGFRA, PMS2, POLD1, POLE, PTEN, RAD50, RAD51C, RAD51D, SDHB, SDHC, SDHD, SMAD4, SMARCA4. STK11, TP53, TSC1, TSC2, and VHL.  The following genes were evaluated for sequence changes only: SDHA and HOXB13 c.251G>A variant only. The report date is March 21, 2018.        FAMILY HISTORY:  We obtained a detailed, 4-generation family history.  Significant diagnoses are listed below: Family History  Problem Relation Age of Onset  . Hypertension Father   . Diabetes Father   . Hypertension Mother   . Breast cancer Maternal Grandmother        dx over 97, d. 70-80s  . Lung cancer Maternal Aunt   . Cancer Maternal Uncle        unknown  . Heart disease Paternal Aunt   . Heart disease Paternal Grandfather     The patient has a son and daughter who are cancer free.  She has two brothers who are cancer free.  Both parents are living.  The patient's mother has hypertension.  She has a brother and sister, both who reportedly had cancer.  The sister reportedly had lung cancer and the brother had an unknown form of cancer.  The maternal grandparents are both deceased.  The grandmother had breast cancer.  The patient's father has diabetes.  He has a full sister and maternal half sister.  His full sister died of heart disease.  Both paternal grandparents are deceased.  Ashley [name] is unaware of previous family history of genetic testing for hereditary cancer risks. Patient's maternal ancestors are of [place] descent, and paternal ancestors are of Caucasian descent. There is no reported Ashkenazi Jewish ancestry. There  is no known consanguinity.  GENETIC TEST RESULTS: Genetic testing reported out on March 22, 2018 through the common hereditary cancer panel found no deleterious mutations.  The Hereditary Gene Panel offered by [facility] includes sequencing and/or deletion duplication testing of the following 47 genes: APC, ATM, AXIN2, BARD1, BMPR1A, BRCA1, BRCA2, BRIP1, CDH1, CDK4, CDKN2A  (p14ARF), CDKN2A (p16INK4a), CHEK2, CTNNA1, DICER1, EPCAM (Deletion/duplication testing only), GREM1 (promoter region deletion/duplication testing only), KIT, MEN1, MLH1, MSH2, MSH3, MSH6, MUTYH, NBN, NF1, NHTL1, PALB2, PDGFRA, PMS2, POLD1, POLE, PTEN, RAD50, RAD51C, RAD51D, SDHB, SDHC, SDHD, SMAD4, SMARCA4. STK11, TP53, TSC1, TSC2, and VHL.  The following genes were evaluated for sequence changes only: SDHA and HOXB13 c.251G>A variant only.   The test report has been scanned into EPIC and is located under the Molecular Pathology section of the Results Review tab.    We discussed with Ashley [name] that since the current genetic testing is not perfect, it is possible there may be a gene mutation in one of these genes that current testing cannot detect, but that chance is small.  We also discussed, that it is possible that another gene that has not yet been discovered, or that we have not yet tested, is responsible for the cancer diagnoses in the family, and it is, therefore, important to remain in touch with cancer genetics in the future so that we can continue to offer Ashley [name] the most up to date genetic testing.   CANCER SCREENING RECOMMENDATIONS:  This result is reassuring and indicates that Ashley [name] likely does not have an increased risk for a future cancer due to a mutation in one of these genes. This normal test also suggests that Ashley [name] cancer was most likely not due to an inherited predisposition associated with one of these genes.  Most cancers happen by chance and this negative test suggests that her cancer falls into this category.  We, therefore, recommended she continue to follow the cancer management and screening guidelines provided by her oncology and primary healthcare provider.   An individual's cancer risk and medical management are not determined by genetic test results alone. Overall cancer risk assessment incorporates additional factors, including personal medical history, family  history, and any available genetic information that may result in a personalized plan for cancer prevention and surveillance.  RECOMMENDATIONS FOR FAMILY MEMBERS:  Women in this family might be at some increased risk of developing cancer, over the general population risk, simply due to the family history of cancer.  We recommended women in this family have a yearly mammogram beginning at age 1, or 13 years younger than the earliest onset of cancer, an annual clinical breast exam, and perform monthly breast self-exams. Women in this family should also have a gynecological exam as recommended by their primary provider. All family members should have a colonoscopy by age 36.  FOLLOW-UP: Lastly, we discussed with Ashley [name] that cancer genetics is a rapidly advancing field and it is possible that new genetic tests will be appropriate for her and/or her family members in the future. We encouraged her to remain in contact with cancer genetics on an annual basis so we can update her personal and family histories and let her know of advances in cancer genetics that may benefit this family.   Our contact number was provided. Ashley [name] questions were answered to her satisfaction, and she knows she is welcome to call us at anytime with additional questions or concerns.   Roma Kayser, MS, [facility] Certified Genetic Counselor  [email or web address]

## 2018-03-25 ENCOUNTER — Other Ambulatory Visit: Payer: Self-pay | Admitting: Surgery

## 2018-03-27 ENCOUNTER — Encounter (HOSPITAL_COMMUNITY): Payer: Self-pay | Admitting: *Deleted

## 2018-03-27 ENCOUNTER — Other Ambulatory Visit: Payer: Self-pay

## 2018-03-27 NOTE — Progress Notes (Signed)
Pt called me back (left message) and said that the Ensure made her very nauseated and she cannot drink that. She requested that I talk to the Anesthesiologist again. I spoke with Dr. Lissa Hoard and told him she couldn't tolerate Ensure. He stated that pt could have clear liquids until 10:30 AM, then NPO. I called pt back and left a message giving her this information. I explained what clear liquids included, water, sodas that can she can see thru, broth, juices without pulp and jello.

## 2018-03-27 NOTE — Progress Notes (Signed)
Spoke with pt for pre-op call. Pt denies cardiac history, HTN or diabetes. She states she takes Inderal for migraines. Pt concerned about long time going with food or drink prior to surgery. I spoke with Dr. Lissa Hoard and he instructed pt to be NPO after MN tonight but to pickup an Ensure clear liquid from a drug store and drink it 2 hours prior to arrival. I called pt back and left this message on her voicemail.
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
## 2018-03-28 ENCOUNTER — Ambulatory Visit (HOSPITAL_COMMUNITY): Payer: BLUE CROSS/BLUE SHIELD | Admitting: Certified Registered Nurse Anesthetist

## 2018-03-28 ENCOUNTER — Encounter (HOSPITAL_COMMUNITY)
Admission: RE | Disposition: A | Discharge status: Home / Self Care | Payer: Self-pay | Source: Ambulatory Visit | Attending: Surgery

## 2018-03-28 ENCOUNTER — Encounter (HOSPITAL_COMMUNITY): Payer: Self-pay | Admitting: *Deleted

## 2018-03-28 ENCOUNTER — Ambulatory Visit (HOSPITAL_COMMUNITY)
Admission: RE | Admit: 2018-03-28 | Discharge: 2018-03-28 | Disposition: A | Discharge status: Home / Self Care | Payer: BLUE CROSS/BLUE SHIELD | Source: Ambulatory Visit | Attending: Surgery | Admitting: Surgery

## 2018-03-28 DIAGNOSIS — Z885 Allergy status to narcotic agent status: Secondary | ICD-10-CM | POA: Diagnosis not present

## 2018-03-28 DIAGNOSIS — Z880 Allergy status to penicillin: Secondary | ICD-10-CM | POA: Diagnosis not present

## 2018-03-28 DIAGNOSIS — D0511 Intraductal carcinoma in situ of right breast: Secondary | ICD-10-CM | POA: Insufficient documentation

## 2018-03-28 DIAGNOSIS — Z882 Allergy status to sulfonamides status: Secondary | ICD-10-CM | POA: Diagnosis not present

## 2018-03-28 DIAGNOSIS — I10 Essential (primary) hypertension: Secondary | ICD-10-CM | POA: Diagnosis not present

## 2018-03-28 DIAGNOSIS — G8929 Other chronic pain: Secondary | ICD-10-CM | POA: Diagnosis not present

## 2018-03-28 DIAGNOSIS — Z886 Allergy status to analgesic agent status: Secondary | ICD-10-CM | POA: Diagnosis not present

## 2018-03-28 DIAGNOSIS — Z881 Allergy status to other antibiotic agents status: Secondary | ICD-10-CM | POA: Insufficient documentation

## 2018-03-28 DIAGNOSIS — Z888 Allergy status to other drugs, medicaments and biological substances status: Secondary | ICD-10-CM | POA: Insufficient documentation

## 2018-03-28 DIAGNOSIS — Z87891 Personal history of nicotine dependence: Secondary | ICD-10-CM | POA: Diagnosis not present

## 2018-03-28 DIAGNOSIS — J309 Allergic rhinitis, unspecified: Secondary | ICD-10-CM | POA: Diagnosis not present

## 2018-03-28 DIAGNOSIS — K219 Gastro-esophageal reflux disease without esophagitis: Secondary | ICD-10-CM | POA: Diagnosis not present

## 2018-03-28 HISTORY — DX: Anemia, unspecified: D64.9

## 2018-03-28 HISTORY — PX: RE-EXCISION OF BREAST CANCER,SUPERIOR MARGINS: SHX6047

## 2018-03-28 HISTORY — DX: Personal history of urinary calculi: Z87.442

## 2018-03-28 HISTORY — DX: Constipation, unspecified: K59.00

## 2018-03-28 HISTORY — DX: Gastro-esophageal reflux disease without esophagitis: K21.9

## 2018-03-28 LAB — CBC
HEMATOCRIT: 42.9 % (ref 36.0–46.0)
HEMOGLOBIN: 14.4 g/dL (ref 12.0–15.0)
MCH: 31 pg (ref 26.0–34.0)
MCHC: 33.6 g/dL (ref 30.0–36.0)
MCV: 92.5 fL (ref 78.0–100.0)
Platelets: 221 10*3/uL (ref 150–400)
RBC: 4.64 MIL/uL (ref 3.87–5.11)
RDW: 12.5 % (ref 11.5–15.5)
WBC: 7.1 10*3/uL (ref 4.0–10.5)

## 2018-03-28 LAB — POCT PREGNANCY, URINE: Preg Test, Ur: NEGATIVE

## 2018-03-28 SURGERY — RE-EXCISION OF BREAST CANCER,SUPERIOR MARGINS
Anesthesia: General | Site: Breast | Laterality: Right

## 2018-03-28 MED ORDER — PROMETHAZINE HCL 25 MG/ML IJ SOLN
6.2500 mg | INTRAMUSCULAR | Status: DC | PRN
Start: 2018-03-28 — End: 2018-03-28

## 2018-03-28 MED ORDER — ACETAMINOPHEN 325 MG PO TABS: 650.0000 mg | ORAL_TABLET | ORAL | Status: DC | PRN

## 2018-03-28 MED ORDER — SODIUM CHLORIDE 0.9% FLUSH: 3.0000 mL | Freq: Two times a day (BID) | INTRAVENOUS | Status: DC

## 2018-03-28 MED ORDER — MIDAZOLAM HCL 5 MG/5ML IJ SOLN
INTRAMUSCULAR | Status: DC | PRN
  Administered 2018-03-28: 2 mg via INTRAVENOUS

## 2018-03-28 MED ORDER — BUPIVACAINE-EPINEPHRINE 0.25% -1:200000 IJ SOLN
INTRAMUSCULAR | Status: DC | PRN
  Administered 2018-03-28: 15 mL

## 2018-03-28 MED ORDER — DEXAMETHASONE SODIUM PHOSPHATE 10 MG/ML IJ SOLN
INTRAMUSCULAR | Status: AC
  Filled 2018-03-28: qty 1

## 2018-03-28 MED ORDER — KETAMINE HCL 10 MG/ML IJ SOLN
INTRAMUSCULAR | Status: DC | PRN
  Administered 2018-03-28: 30 mg via INTRAVENOUS

## 2018-03-28 MED ORDER — SODIUM CHLORIDE 0.9% FLUSH: 3.0000 mL | INTRAVENOUS | Status: DC | PRN

## 2018-03-28 MED ORDER — DEXAMETHASONE SODIUM PHOSPHATE 10 MG/ML IJ SOLN
INTRAMUSCULAR | Status: DC | PRN
  Administered 2018-03-28: 10 mg via INTRAVENOUS

## 2018-03-28 MED ORDER — FENTANYL CITRATE (PF) 250 MCG/5ML IJ SOLN
INTRAMUSCULAR | Status: AC
  Filled 2018-03-28: qty 5

## 2018-03-28 MED ORDER — VANCOMYCIN HCL IN DEXTROSE 1-5 GM/200ML-% IV SOLN
INTRAVENOUS | Status: AC
  Filled 2018-03-28: qty 200

## 2018-03-28 MED ORDER — GABAPENTIN 300 MG PO CAPS: 300.0000 mg | ORAL_CAPSULE | ORAL | Status: DC

## 2018-03-28 MED ORDER — LACTATED RINGERS IV SOLN
INTRAVENOUS | Status: DC
  Administered 2018-03-28: 14:00:00 via INTRAVENOUS

## 2018-03-28 MED ORDER — ACETAMINOPHEN 500 MG PO TABS
ORAL_TABLET | ORAL | Status: AC
  Administered 2018-03-28: 1000 mg
  Filled 2018-03-28: qty 2

## 2018-03-28 MED ORDER — SODIUM CHLORIDE 0.9 % IV SOLN: 250.0000 mL | INTRAVENOUS | Status: DC | PRN

## 2018-03-28 MED ORDER — FENTANYL CITRATE (PF) 100 MCG/2ML IJ SOLN: 25.0000 ug | INTRAMUSCULAR | Status: DC | PRN

## 2018-03-28 MED ORDER — 0.9 % SODIUM CHLORIDE (POUR BTL) OPTIME
TOPICAL | Status: DC | PRN
  Administered 2018-03-28: 1000 mL

## 2018-03-28 MED ORDER — CHLORHEXIDINE GLUCONATE CLOTH 2 % EX PADS: 6.0000 | MEDICATED_PAD | Freq: Once | CUTANEOUS | Status: DC

## 2018-03-28 MED ORDER — KETAMINE HCL 10 MG/ML IJ SOLN
INTRAMUSCULAR | Status: AC
  Filled 2018-03-28: qty 1

## 2018-03-28 MED ORDER — BUPIVACAINE-EPINEPHRINE (PF) 0.25% -1:200000 IJ SOLN
INTRAMUSCULAR | Status: AC
  Filled 2018-03-28: qty 30

## 2018-03-28 MED ORDER — ONDANSETRON HCL 4 MG/2ML IJ SOLN
INTRAMUSCULAR | Status: AC
  Filled 2018-03-28: qty 2

## 2018-03-28 MED ORDER — PROPOFOL 10 MG/ML IV BOLUS
INTRAVENOUS | Status: DC | PRN
  Administered 2018-03-28: 200 mg via INTRAVENOUS

## 2018-03-28 MED ORDER — MIDAZOLAM HCL 2 MG/2ML IJ SOLN
INTRAMUSCULAR | Status: AC
  Filled 2018-03-28: qty 2

## 2018-03-28 MED ORDER — FENTANYL CITRATE (PF) 100 MCG/2ML IJ SOLN
INTRAMUSCULAR | Status: DC | PRN
  Administered 2018-03-28 (×2): 50 ug via INTRAVENOUS

## 2018-03-28 MED ORDER — MIDAZOLAM HCL 2 MG/2ML IJ SOLN: 0.5000 mg | Freq: Once | INTRAMUSCULAR | Status: DC | PRN

## 2018-03-28 MED ORDER — ONDANSETRON HCL 4 MG/2ML IJ SOLN
INTRAMUSCULAR | Status: DC | PRN
  Administered 2018-03-28: 4 mg via INTRAVENOUS

## 2018-03-28 MED ORDER — LIDOCAINE IN D5W 4-5 MG/ML-% IV SOLN
1.0000 mg/min | INTRAVENOUS | Status: DC
  Filled 2018-03-28: qty 500

## 2018-03-28 MED ORDER — ACETAMINOPHEN 500 MG PO TABS: 1000.0000 mg | ORAL_TABLET | ORAL | Status: DC

## 2018-03-28 MED ORDER — ACETAMINOPHEN 650 MG RE SUPP: 650.0000 mg | RECTAL | Status: DC | PRN

## 2018-03-28 MED ORDER — VANCOMYCIN HCL IN DEXTROSE 1-5 GM/200ML-% IV SOLN
1000.0000 mg | INTRAVENOUS | Status: AC
  Administered 2018-03-28: 1000 mg via INTRAVENOUS

## 2018-03-28 MED ORDER — LIDOCAINE HCL (CARDIAC) PF 100 MG/5ML IV SOSY
PREFILLED_SYRINGE | INTRAVENOUS | Status: DC | PRN
  Administered 2018-03-28: 40 mg via INTRAVENOUS

## 2018-03-28 MED ORDER — KETOROLAC TROMETHAMINE 30 MG/ML IJ SOLN
INTRAMUSCULAR | Status: DC | PRN
  Administered 2018-03-28: 30 mg via INTRAVENOUS

## 2018-03-28 MED ORDER — MEPERIDINE HCL 50 MG/ML IJ SOLN: 6.2500 mg | INTRAMUSCULAR | Status: DC | PRN

## 2018-03-28 MED ORDER — GABAPENTIN 300 MG PO CAPS
ORAL_CAPSULE | ORAL | Status: AC
  Administered 2018-03-28: 300 mg
  Filled 2018-03-28: qty 1

## 2018-03-28 MED ORDER — KETOROLAC TROMETHAMINE 30 MG/ML IJ SOLN
INTRAMUSCULAR | Status: AC
  Filled 2018-03-28: qty 1

## 2018-03-28 SURGICAL SUPPLY — 36 items
CANISTER SUCT 3000ML PPV (MISCELLANEOUS) IMPLANT
CHLORAPREP W/TINT 26ML (MISCELLANEOUS) ×2 IMPLANT
CONT SPEC 4OZ CLIKSEAL STRL BL (MISCELLANEOUS) ×2 IMPLANT
COVER SURGICAL LIGHT HANDLE (MISCELLANEOUS) ×2 IMPLANT
DECANTER SPIKE VIAL GLASS SM (MISCELLANEOUS) ×2 IMPLANT
DERMABOND ADVANCED (GAUZE/BANDAGES/DRESSINGS) ×1
DERMABOND ADVANCED .7 DNX12 (GAUZE/BANDAGES/DRESSINGS) ×1 IMPLANT
DRAPE CHEST BREAST 15X10 FENES (DRAPES) ×2 IMPLANT
DRAPE UTILITY XL STRL (DRAPES) ×4 IMPLANT
DRSG TEGADERM 4X4.75 (GAUZE/BANDAGES/DRESSINGS) ×2 IMPLANT
ELECT CAUTERY BLADE 6.4 (BLADE) ×2 IMPLANT
ELECT REM PT RETURN 9FT ADLT (ELECTROSURGICAL) ×2
ELECTRODE REM PT RTRN 9FT ADLT (ELECTROSURGICAL) ×1 IMPLANT
GAUZE SPONGE 4X4 12PLY STRL (GAUZE/BANDAGES/DRESSINGS) ×2 IMPLANT
GLOVE SURG SIGNA 7.5 PF LTX (GLOVE) ×2 IMPLANT
GOWN STRL REUS W/ TWL LRG LVL3 (GOWN DISPOSABLE) ×1 IMPLANT
GOWN STRL REUS W/ TWL XL LVL3 (GOWN DISPOSABLE) ×1 IMPLANT
GOWN STRL REUS W/TWL LRG LVL3 (GOWN DISPOSABLE) ×1
GOWN STRL REUS W/TWL XL LVL3 (GOWN DISPOSABLE) ×1
KIT BASIN OR (CUSTOM PROCEDURE TRAY) ×2 IMPLANT
KIT TURNOVER KIT B (KITS) ×2 IMPLANT
NEEDLE HYPO 25GX1X1/2 BEV (NEEDLE) ×2 IMPLANT
NS IRRIG 1000ML POUR BTL (IV SOLUTION) ×2 IMPLANT
PACK SURGICAL SETUP 50X90 (CUSTOM PROCEDURE TRAY) ×2 IMPLANT
PAD ARMBOARD 7.5X6 YLW CONV (MISCELLANEOUS) ×2 IMPLANT
PENCIL BUTTON HOLSTER BLD 10FT (ELECTRODE) ×2 IMPLANT
SPONGE LAP 4X18 X RAY DECT (DISPOSABLE) ×2 IMPLANT
SUT MNCRL AB 4-0 PS2 18 (SUTURE) ×2 IMPLANT
SUT VIC AB 3-0 SH 27 (SUTURE) ×1
SUT VIC AB 3-0 SH 27X BRD (SUTURE) ×1 IMPLANT
SYR BULB 3OZ (MISCELLANEOUS) ×2 IMPLANT
SYR CONTROL 10ML LL (SYRINGE) ×2 IMPLANT
TOWEL OR 17X24 6PK STRL BLUE (TOWEL DISPOSABLE) ×2 IMPLANT
TOWEL OR 17X26 10 PK STRL BLUE (TOWEL DISPOSABLE) ×2 IMPLANT
TUBE CONNECTING 12X1/4 (SUCTIONS) IMPLANT
YANKAUER SUCT BULB TIP NO VENT (SUCTIONS) IMPLANT

## 2018-03-28 NOTE — Transfer of Care (Signed)
Immediate Anesthesia Transfer of Care Note  Patient: Ashley [name]  Procedure(s) Performed: RE-EXCISION OFRIGHT  BREAST DUCTAL CARCINOMA ERAS PATHWAY (Right Breast)  Patient Location: PACU  Anesthesia Type:General  Level of Consciousness: awake and patient cooperative  Airway & Oxygen Therapy: Patient Spontanous Breathing  Post-op Assessment: Report given to RN and Post -op Vital signs reviewed and stable  Post vital signs: Reviewed and stable  Last Vitals:  Vitals Value Taken Time  BP 150/86 03/28/2018  3:28 PM  Temp 36.4 C 03/28/2018  3:28 PM  Pulse 66 03/28/2018  3:32 PM  Resp 23 03/28/2018  3:32 PM  SpO2 100 % 03/28/2018  3:32 PM  Vitals shown include unvalidated device data.  Last Pain:  Vitals:   03/28/18 1528  TempSrc:   PainSc: Asleep      Patients Stated Pain Goal: 4 (09/98/33 8250)  Complications: No apparent anesthesia complications

## 2018-03-28 NOTE — Op Note (Signed)
RE-EXCISION OFRIGHT  BREAST DUCTAL CARCINOMA ERAS PATHWAY  Procedure Note  Ashley [name] 03/28/2018   Pre-op Diagnosis: right breast ductal cardinoma in situ     Post-op Diagnosis: same  Procedure(s): RE-EXCISION OFRIGHT  BREAST DUCTAL CARCINOMA ERAS PATHWAY  Surgeon(s): Coralie Keens, MD  Anesthesia: General  Staff:  Circulator: Hal Morales, RN Scrub Person: Rozell Searing, RN  Estimated Blood Loss: Minimal               Specimens: Lateral inferior margin of the right breast biopsy cavity  Procedure: The patient was brought to the operating room and identified as correct patient.  She is placed upon the operating table and general anesthesia was induced.  Her right breast was then prepped and draped in usual sterile fashion.  I made an incision through the previous circumareolar incision at the lower edge of the areola.  I take this down to the previous partial mastectomy cavity.  I suctioned the fluid out.  I then excised the inferior lateral margin and one piece.  Again I went all the way down to the chest wall.  The specimen was sent to pathology for evaluation.  Hemostasis was achieved with the cautery.  I closed the subtenons tissue with interrupted 3-0 Vicryl sutures and closed the skin with a running 4-0 Monocryl.  Dermabond was then applied.  The patient tolerated the procedure well.  All the counts were correct at the end of procedure.  The patient was then extubated in the operating room and taken in stable condition to the recovery room.          Ashley [name] A   Date: 03/28/2018  Time: 3:20 PM

## 2018-03-28 NOTE — Anesthesia Procedure Notes (Signed)
Procedure Name: LMA Insertion Date/Time: 03/28/2018 2:48 PM Performed by: Lance Coon, CRNA Pre-anesthesia Checklist: Patient identified, Emergency Drugs available, Suction available, Patient being monitored and Timeout performed Patient Re-evaluated:Patient Re-evaluated prior to induction Oxygen Delivery Method: Circle system utilized Preoxygenation: Pre-oxygenation with 100% oxygen Induction Type: IV induction LMA: LMA inserted LMA Size: 4.0 Number of attempts: 1 Placement Confirmation: positive ETCO2 and breath sounds checked- equal and bilateral Tube secured with: Tape Dental Injury: Teeth and Oropharynx as per pre-operative assessment

## 2018-03-28 NOTE — Discharge Instructions (Signed)
[facility] °Office Phone Number [phone] ° °BREAST BIOPSY/ PARTIAL MASTECTOMY: POST OP INSTRUCTIONS ° °Always review your discharge instruction sheet given to you by the facility where your surgery was performed. ° °IF YOU HAVE DISABILITY OR FAMILY LEAVE FORMS, YOU MUST BRING THEM TO THE OFFICE FOR PROCESSING.  DO NOT GIVE THEM TO YOUR DOCTOR. ° °1. A prescription for pain medication may be given to you upon discharge.  Take your pain medication as prescribed, if needed.  If narcotic pain medicine is not needed, then you may take acetaminophen (Tylenol) or ibuprofen (Advil) as needed. °2. Take your usually prescribed medications unless otherwise directed °3. If you need a refill on your pain medication, please contact your pharmacy.  They will contact our office to request authorization.  Prescriptions will not be filled after 5pm or on week-ends. °4. You should eat very light the first 24 hours after surgery, such as soup, crackers, pudding, etc.  Resume your normal diet the day after surgery. °5. Most patients will experience some swelling and bruising in the breast.  Ice packs and a good support bra will help.  Swelling and bruising can take several days to resolve.  °6. It is common to experience some constipation if taking pain medication after surgery.  Increasing fluid intake and taking a stool softener will usually help or prevent this problem from occurring.  A mild laxative (Milk of Magnesia or Miralax) should be taken according to package directions if there are no bowel movements after 48 hours. °7. Unless discharge instructions indicate otherwise, you may remove your bandages 24-48 hours after surgery, and you may shower at that time.  You may have steri-strips (small skin tapes) in place directly over the incision.  These strips should be left on the skin for 7-10 days.  If your surgeon used skin glue on the incision, you may shower in 24 hours.  The glue will flake off over the  next 2-3 weeks.  Any sutures or staples will be removed at the office during your follow-up visit. °8. ACTIVITIES:  You may resume regular daily activities (gradually increasing) beginning the next day.  Wearing a good support bra or sports bra minimizes pain and swelling.  You may have sexual intercourse when it is comfortable. °a. You may drive when you no longer are taking prescription pain medication, you can comfortably wear a seatbelt, and you can safely maneuver your car and apply brakes. °b. RETURN TO WORK:  ______________________________________________________________________________________ °9. You should see your doctor in the office for a follow-up appointment approximately two weeks after your surgery.  Your doctor’s nurse will typically make your follow-up appointment when she calls you with your pathology report.  Expect your pathology report 2-3 business days after your surgery.  You may call to check if you do not hear from us after three days. °10. OTHER INSTRUCTIONS: _______________________________________________________________________________________________ _____________________________________________________________________________________________________________________________________ °_____________________________________________________________________________________________________________________________________ °_____________________________________________________________________________________________________________________________________ ° °WHEN TO CALL YOUR DOCTOR: °1. Fever over 101.0 °2. Nausea and/or vomiting. °3. Extreme swelling or bruising. °4. Continued bleeding from incision. °5. Increased pain, redness, or drainage from the incision. ° °The clinic staff is available to answer your questions during regular business hours.  Please don’t hesitate to call and ask to speak to one of the nurses for clinical concerns.  If you have a medical emergency, go to the nearest  emergency room or call 911.  A surgeon from [facility] is always on call at the hospital. ° °For further questions, please visit [email or web address]  °

## 2018-03-28 NOTE — Interval H&P Note (Signed)
History and Physical Interval Note: no change in H and P  03/28/2018 2:04 PM  Ashley [name]  has presented today for surgery, with the diagnosis of right breast ductal cardinoma in situ  The various methods of treatment have been discussed with the patient and family. After consideration of risks, benefits and other options for treatment, the patient has consented to  Procedure(s): RE-EXCISION OFRIGHT  BREAST DUCTAL CARCINOMA ERAS PATHWAY (Right) as a surgical intervention .  The patient's history has been reviewed, patient examined, no change in status, stable for surgery.  I have reviewed the patient's chart and labs.  Questions were answered to the patient's satisfaction.     [name] A

## 2018-03-28 NOTE — Anesthesia Preprocedure Evaluation (Addendum)
Anesthesia Evaluation   Patient awake    History of Anesthesia Complications (+) PONV  Airway Mallampati: II  TM Distance: >3 FB     Dental   Pulmonary former smoker,    breath sounds clear to auscultation       Cardiovascular hypertension,  Rhythm:Regular Rate:Normal     Neuro/Psych    GI/Hepatic Neg liver ROS, GERD  Medicated,  Endo/Other    Renal/GU negative Renal ROS     Musculoskeletal   Abdominal   Peds  Hematology   Anesthesia Other Findings Breast cancer  Reproductive/Obstetrics                            Anesthesia Physical Anesthesia Plan  ASA: III  Anesthesia Plan: General   Post-op Pain Management:    Induction: Intravenous  PONV Risk Score and Plan: 4 or greater and Treatment may vary due to age or medical condition, Ondansetron, Dexamethasone and Midazolam  Airway Management Planned: LMA  Additional Equipment:   Intra-op Plan:   Post-operative Plan: Extubation in OR  Informed Consent: I have reviewed the patients History and Physical, chart, labs and discussed the procedure including the risks, benefits and alternatives for the proposed anesthesia with the patient or authorized representative who has indicated his/her understanding and acceptance.   Dental advisory given  Plan Discussed with: Anesthesiologist and CRNA  Anesthesia Plan Comments:         Anesthesia Quick Evaluation

## 2018-03-29 ENCOUNTER — Encounter (HOSPITAL_COMMUNITY): Payer: Self-pay | Admitting: Surgery

## 2018-04-01 NOTE — Anesthesia Postprocedure Evaluation (Signed)
Anesthesia Post Note  Patient: Ashley [name]  Procedure(s) Performed: RE-EXCISION OFRIGHT  BREAST DUCTAL CARCINOMA ERAS PATHWAY (Right Breast)     Patient location during evaluation: PACU Anesthesia Type: General Level of consciousness: awake and alert, oriented and patient cooperative Pain management: pain level controlled Vital Signs Assessment: post-procedure vital signs reviewed and stable Respiratory status: spontaneous breathing, nonlabored ventilation and respiratory function stable Cardiovascular status: blood pressure returned to baseline and stable Postop Assessment: no apparent nausea or vomiting Anesthetic complications: no    Last Vitals:  Vitals:   03/28/18 1612 03/28/18 1649  BP: (!) 156/94 (!) 158/78  Pulse: 72 80  Resp: 14 15  Temp: 36.7 C 36.9 C  SpO2: 100% 100%    Last Pain:  Vitals:   03/28/18 1555  TempSrc:   PainSc: Asleep   Pain Goal: Patients Stated Pain Goal: 4 (03/28/18 1315)               Seleta Rhymes. Coden Franchi

## 2018-04-05 DIAGNOSIS — C50811 Malignant neoplasm of overlapping sites of right female breast: Secondary | ICD-10-CM | POA: Diagnosis not present

## 2018-04-11 ENCOUNTER — Encounter: Payer: Self-pay | Admitting: Family Medicine

## 2018-04-11 ENCOUNTER — Ambulatory Visit: Payer: BLUE CROSS/BLUE SHIELD | Admitting: Family Medicine

## 2018-04-11 VITALS — BP 120/74 | HR 76 | Temp 97.8°F | Resp 14 | Ht 64.0 in | Wt 138.0 lb

## 2018-04-11 DIAGNOSIS — R300 Dysuria: Secondary | ICD-10-CM

## 2018-04-11 DIAGNOSIS — H029 Unspecified disorder of eyelid: Secondary | ICD-10-CM | POA: Diagnosis not present

## 2018-04-11 LAB — URINALYSIS, ROUTINE W REFLEX MICROSCOPIC
Bilirubin Urine: NEGATIVE
Glucose, UA: NEGATIVE
KETONES UR: NEGATIVE
LEUKOCYTES UA: NEGATIVE
NITRITE: NEGATIVE
PH: 5.5 (ref 5.0–8.0)
Protein, ur: NEGATIVE
Specific Gravity, Urine: 1.025 (ref 1.001–1.03)
WBC UA: NONE SEEN /HPF (ref 0–5)

## 2018-04-11 LAB — MICROSCOPIC MESSAGE

## 2018-04-11 MED ORDER — NITROFURANTOIN MONOHYD MACRO 100 MG PO CAPS: 100.0000 mg | ORAL_CAPSULE | Freq: Two times a day (BID) | ORAL | 0 refills | Status: DC
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
## 2018-04-11 NOTE — Addendum Note (Signed)
Addended by: Shary Decamp B on: 04/11/2018 11:11 AM   Modules accepted: Orders

## 2018-04-12 ENCOUNTER — Ambulatory Visit: Payer: BLUE CROSS/BLUE SHIELD | Admitting: Family Medicine

## 2018-04-12 ENCOUNTER — Other Ambulatory Visit: Payer: Self-pay

## 2018-04-12 VITALS — BP 124/76 | HR 78 | Temp 97.9°F | Ht 64.0 in | Wt 138.0 lb

## 2018-04-12 DIAGNOSIS — J0141 Acute recurrent pansinusitis: Secondary | ICD-10-CM | POA: Diagnosis not present

## 2018-04-12 DIAGNOSIS — H6520 Chronic serous otitis media, unspecified ear: Secondary | ICD-10-CM | POA: Diagnosis not present

## 2018-04-12 MED ORDER — FLUCONAZOLE 150 MG PO TABS: ORAL_TABLET | ORAL | 0 refills | Status: DC

## 2018-04-12 MED ORDER — DOXYCYCLINE HYCLATE 100 MG PO TABS: 100.0000 mg | ORAL_TABLET | Freq: Two times a day (BID) | ORAL | 0 refills | Status: DC

## 2018-04-12 NOTE — Progress Notes (Signed)
Patient ID: Ashley [name], female    DOB: 03-14-1976, 42 y.o.   MRN: [id number]  PCP: Susy Frizzle, MD  Chief Complaint  Patient presents with  . Sinusitis    Subjective:   Ashley [name] is a 42 y.o. female, presents to clinic with CC of acute worsening of sinus pain and pressure.  States that she had nasal congestion, discharge and pressure with yellow to green discharge with blood for the past several days but it acutely worsened yesterday becoming much more severe, pain is located in the right side of her face or maxillary sinus.  Reports a history of recurrent sinusitis.  She also is dealing with several other health problems at this time including a UTI diagnosed yesterday and starting treatment with Macrobid, breast cancer with multiple recent surgeries, otherwise she has complicated medical history including multiple antibiotic allergies, history of migraines, allergic rhinitis, sinusitis. Sinus pain and pressure severe, constant, acutely worsening yesterday after several days of recurring symptoms.  Her pain radiates to her right ear, she has a history of recurrent serous effusions.  She is gone to ENT in the past but cannot remember the physician's name.  She been having more frequent brief nosebleeds so she stopped using the steroid nasal spray and is using saline nasal spray only and has continued her other medications.  She is concerned with her multiple antibiotic allergies, is requesting a Z-Pak saying that it has worked for her in the past.  When she is on multiple antibiotics she does frequently get yeast infection she is also requesting treatment for that.  Worsening sinus pain pressure and congestion has triggered a migraine, feels consistent with her baseline migraines.  She denies any other associated symptoms including fever, neck pain, neck stiffness.  Patient Active Problem List   Diagnosis Date Noted  . Genetic testing 03/22/2018  . Family history of breast  cancer   . 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 Migraine headache 09/01/2013  . Microscopic hematuria 09/01/2013  . DERMATITIS, SEBORRHEIC NOS 06/21/2007  . MASS, SUPERFICIAL 06/21/2007  . DYSURIA 06/04/2007  . Pap smear of cervix with ASCUS, cannot exclude HGSIL 05/28/2007  . HEADACHE 04/24/2007  . SINUSITIS 03/29/2007  . IRREGULAR MENSES 03/29/2007  . Allergic rhinitis, cause unspecified 01/31/2007  . CONSTIPATION 01/31/2007  . ACNE 01/31/2007  . DERMATITIS NOS 01/31/2007  . BACK PAIN, LOW 01/31/2007  . HIGH RISK PATIENT 01/31/2007     Prior to Admission medications   Medication Sig Start Date End Date Taking? Authorizing Provider  acetaminophen (TYLENOL) 500 MG tablet Take 1,000 mg by mouth every 8 (eight) hours as needed.   Yes [provider]  ibuprofen (ADVIL,MOTRIN) 800 MG tablet Take 1 tablet (800 mg total) by mouth every 8 (eight) hours as needed for moderate pain. 07/18/17  Yes Daleen Bo, MD  Levonorgestrel [facility]) 19.5 MG IUD by Intrauterine route once. 03/21/18  Yes [provider]  neomycin-polymyxin b-dexamethasone (MAXITROL) 3.5-10000-0.1 [place]  04/11/18  Yes [provider]  nitrofurantoin, macrocrystal-monohydrate, (MACROBID) 100 MG capsule Take 1 capsule (100 mg total) by mouth 2 (two) times daily. 04/11/18  Yes Susy Frizzle, MD  omeprazole (PRILOSEC) 40 MG capsule TAKE 1 CAPSULE BY MOUTH ONCE DAILY 02/25/18  Yes Susy Frizzle, MD  promethazine (PHENERGAN) 25 MG tablet Take 1 tablet (25 mg total) by mouth every 6 (six) hours as needed for nausea or vomiting. 02/26/18  Yes Coralie Keens, MD  propranolol (INDERAL) 10 MG tablet Take 1 tablet (10 mg total) at bedtime by mouth. 10/12/17  Yes [place], Modena Nunnery, MD  rizatriptan (MAXALT) 5 MG tablet Take 1 tablet (5 mg total) by mouth as needed for migraine. May repeat in 2 hours if needed Patient taking differently: Take 10 mg by mouth every 2 (two) hours as  needed for migraine. May repeat in 2 hours if needed 05/29/17  Yes [place], Modena Nunnery, MD     Allergies  Allergen Reactions  . Prednisone Palpitations  . Aspirin Hives  . Bactrim [Sulfamethoxazole-Trimethoprim] Rash  . Biaxin [Clarithromycin] Other (See Comments)    Induced migraine  . Ciprofloxacin Rash  . Diphenhydramine Hcl Palpitations and Other (See Comments)    *doesn't like the way the injection makes her feel*  . Latex Itching and Rash  . Levaquin [Levofloxacin In D5w] Hives  . Penicillins Hives and Other (See Comments)    Has patient had a PCN reaction causing immediate rash, facial/tongue/throat swelling, SOB or lightheadedness with hypotension: No Has patient had a PCN reaction causing severe rash involving mucus membranes or skin necrosis: No Has patient had a PCN reaction that required hospitalization: No Has patient had a PCN reaction occurring within the last 10 years: No If all of the above answers are "NO", then may proceed with Cephalosporin use.   . Vicodin [Hydrocodone-Acetaminophen] Nausea And Vomiting     Family History  Problem Relation Age of Onset  . Hypertension Father   . Diabetes Father   . Hypertension Mother   . Breast cancer Maternal Grandmother        dx over 10, d. 70-80s  . Lung cancer Maternal Aunt   . Cancer Maternal Uncle        unknown  . Heart disease Paternal Aunt   . Heart disease Paternal Grandfather      Social History   Socioeconomic History  . Marital status: Single    Spouse name: Not on file  . Number of children: 2  . Years of education: Not on file  . Highest education level: Not on file  Occupational History  . Not on file  Social Needs  . Financial resource strain: Not on file  . Food insecurity:    Worry: Not on file    Inability: Not on file  . Transportation needs:    Medical: Not on file    Non-medical: Not on file  Tobacco Use  . Smoking status: Former Smoker    Last attempt to quit: 03/06/2004    Years  since quitting: 14.1  . Smokeless tobacco: Never Used  Substance and Sexual Activity  . Alcohol use: No  . Drug use: No  . Sexual activity: Yes    Birth control/protection: Pill  Lifestyle  . Physical activity:    Days per week: Not on file    Minutes per session: Not on file  . Stress: Not on file  Relationships  . Social connections:    Talks on phone: Not on file    Gets together: Not on file    Attends religious service: Not on file    Active member of club or organization: Not on file    Attends meetings of clubs or organizations: Not on file    Relationship status: Not on file  . Intimate partner violence:    Fear of current or ex partner: Not on file    Emotionally abused: Not on file    Physically abused: Not on  file    Forced sexual activity: Not on file  Other Topics Concern  . Not on file  Social History Narrative  . Not on file     Review of Systems  Constitutional: Negative.   Eyes: Negative.   Cardiovascular: Negative.   Endocrine: Negative.   Genitourinary: Negative.   Musculoskeletal: Negative.   Skin: Negative.   Neurological: Negative.   Psychiatric/Behavioral: Negative.   All other systems reviewed and are negative. 10 Systems reviewed and are negative for acute change except as noted in the HPI.      Objective:    Vitals:   04/12/18 1453  BP: 124/76  Pulse: 78  Temp: 97.9 F (36.6 C)  TempSrc: Oral  SpO2: 98%  Weight: 138 lb (62.6 kg)  Height: 5\' 4"  (1.626 m)      Physical Exam  Constitutional: She is oriented to person, place, and time. She appears well-developed and well-nourished.  Non-toxic appearance. No distress.  HENT:  Head: Normocephalic and atraumatic.  Right Ear: External ear normal.  Left Ear: External ear normal.  Mouth/Throat: Uvula is midline and mucous membranes are normal.  Serous effusion to right ear Nasal mucosa severely edematous and mildly erythematous with congestion Very mild sinus tenderness to  palpation Posterior oropharynx with mild erythema, no edema, uvula midline No cervical lymphadenopathy  Eyes: Pupils are equal, round, and reactive to light. Conjunctivae, EOM and lids are normal. No scleral icterus.  Neck: Normal range of motion and phonation normal. Neck supple. No tracheal deviation present.  Cardiovascular: Normal rate, regular rhythm, normal heart sounds and normal pulses. Exam reveals no gallop and no friction rub.  No murmur heard. Pulses:      Radial pulses are 2+ on the right side, and 2+ on the left side.       Posterior tibial pulses are 2+ on the right side, and 2+ on the left side.  Pulmonary/Chest: Effort normal and breath sounds normal. No stridor. No respiratory distress. She has no wheezes. She has no rhonchi. She has no rales. She exhibits no tenderness.  Abdominal: Soft. Normal appearance and bowel sounds are normal. She exhibits no distension. There is no tenderness. There is no rebound and no guarding.  Musculoskeletal: Normal range of motion. She exhibits no edema or deformity.  Lymphadenopathy:    She has no cervical adenopathy.  Neurological: She is alert and oriented to person, place, and time. She exhibits normal muscle tone. Gait normal.  Skin: Skin is warm, dry and intact. Capillary refill takes less than 2 seconds. No rash noted. She is not diaphoretic. No pallor.  Psychiatric: She has a normal mood and affect. Her speech is normal and behavior is normal.  Nursing note and vitals reviewed.         Assessment & Plan:      ICD-10-CM   1. Acute recurrent pansinusitis J01.41 doxycycline (VIBRA-TABS) 100 MG tablet    Ambulatory referral to ENT  2. Chronic serous otitis media, unspecified laterality H65.20 Ambulatory referral to ENT     42 year old female with recurrent sinusitis and serous otitis media, she reports having gradual onset of worsening nasal congestion and discharge for the past several days which acutely worsened yesterday.  She  is also seen yesterday for UTI, was treated with Macrobid.  She is adamantly requesting a Z-Pak which I feel uncomfortable prescribing for sinusitis given guidelines recommendations.  We reviewed her antibiotic allergy profile extensively, she refuses to take a cephalosporin.  She does have a  penicillin allergy.  I have tried to convince her to take doxycycline and have printed current guidelines recommendations for her which is having would convince her that it is the most effective treatment for sinusitis that is not a current allergy for her.  She is very concerned that this will be a new allergy.  I have asked her to try it and contact us if she has any problem with the medication.  Diflucan with 2 doses was called in for her given her history of yeast vaginitis secondary to antibiotic use.     Delsa Grana, PA-C 04/12/18 3:33 PM

## 2018-04-12 NOTE — Patient Instructions (Addendum)
Doxycycline is the next recommended antibiotic treatment for sinus infection for people with penicillin allergies.  These recommendations come from an international consensus statement on Allergy and Rhinology Rhinosinusitis.  Initial recommendations are to wait for 8-10 days, or only treat when having fevers with facial pain. I am willing to prescribe doxycycline, but will not give Zpak.  You are welcome to get a second opinion or I will have Dr. Dennard Schaumann call it in for you if he is willing.    Use saline spray and humidifier. I would start steroid nasal spray for short term use to help reduce local swelling to sinuses and help with left ear fluid    Sinusitis, Adult Sinusitis is soreness and inflammation of your sinuses. Sinuses are hollow spaces in the bones around your face. Your sinuses are located:  Around your eyes.  In the middle of your forehead.  Behind your nose.  In your cheekbones.  Your sinuses and nasal passages are lined with a stringy fluid (mucus). Mucus normally drains out of your sinuses. When your nasal tissues become inflamed or swollen, the mucus can become trapped or blocked so air cannot flow through your sinuses. This allows bacteria, viruses, and funguses to grow, which leads to infection. Sinusitis can develop quickly and last for 7?10 days (acute) or for more than 12 weeks (chronic). Sinusitis often develops after a cold. What are the causes? This condition is caused by anything that creates swelling in the sinuses or stops mucus from draining, including:  Allergies.  Asthma.  Bacterial or viral infection.  Abnormally shaped bones between the nasal passages.  Nasal growths that contain mucus (nasal polyps).  Narrow sinus openings.  Pollutants, such as chemicals or irritants in the air.  A foreign object stuck in the nose.  A fungal infection. This is rare.  What increases the risk? The following factors may make you more likely to develop this  condition:  Having allergies or asthma.  Having had a recent cold or respiratory tract infection.  Having structural deformities or blockages in your nose or sinuses.  Having a weak immune system.  Doing a lot of swimming or diving.  Overusing nasal sprays.  Smoking.  What are the signs or symptoms? The main symptoms of this condition are pain and a feeling of pressure around the affected sinuses. Other symptoms include:  Upper toothache.  Earache.  Headache.  Bad breath.  Decreased sense of smell and taste.  A cough that may get worse at night.  Fatigue.  Fever.  Thick drainage from your nose. The drainage is often green and it may contain pus (purulent).  Stuffy nose or congestion.  Postnasal drip. This is when extra mucus collects in the throat or back of the nose.  Swelling and warmth over the affected sinuses.  Sore throat.  Sensitivity to light.  How is this diagnosed? This condition is diagnosed based on symptoms, a medical history, and a physical exam. To find out if your condition is acute or chronic, your health care provider may:  Look in your nose for signs of nasal polyps.  Tap over the affected sinus to check for signs of infection.  View the inside of your sinuses using an imaging device that has a light attached (endoscope).  If your health care provider suspects that you have chronic sinusitis, you may also:  Be tested for allergies.  Have a sample of mucus taken from your nose (nasal culture) and checked for bacteria.  Have a mucus  sample examined to see if your sinusitis is related to an allergy.  If your sinusitis does not respond to treatment and it lasts longer than 8 weeks, you may have an MRI or CT scan to check your sinuses. These scans also help to determine how severe your infection is. In rare cases, a bone biopsy may be done to rule out more serious types of fungal sinus disease. How is this treated? Treatment for  sinusitis depends on the cause and whether your condition is chronic or acute. If a virus is causing your sinusitis, your symptoms will go away on their own within 10 days. You may be given medicines to relieve your symptoms, including:  Topical nasal decongestants. They shrink swollen nasal passages and let mucus drain from your sinuses.  Antihistamines. These drugs block inflammation that is triggered by allergies. This can help to ease swelling in your nose and sinuses.  Topical nasal corticosteroids. These are nasal sprays that ease inflammation and swelling in your nose and sinuses.  Nasal saline washes. These rinses can help to get rid of thick mucus in your nose.  If your condition is caused by bacteria, you will be given an antibiotic medicine. If your condition is caused by a fungus, you will be given an antifungal medicine. Surgery may be needed to correct underlying conditions, such as narrow nasal passages. Surgery may also be needed to remove polyps. Follow these instructions at home: Medicines  Take, use, or apply over-the-counter and prescription medicines only as told by your health care provider. These may include nasal sprays.  If you were prescribed an antibiotic medicine, take it as told by your health care provider. Do not stop taking the antibiotic even if you start to feel better. Hydrate and Humidify  Drink enough water to keep your urine clear or pale yellow. Staying hydrated will help to thin your mucus.  Use a cool mist humidifier to keep the humidity level in your home above 50%.  Inhale steam for 10-15 minutes, 3-4 times a day or as told by your health care provider. You can do this in the bathroom while a hot shower is running.  Limit your exposure to cool or dry air. Rest  Rest as much as possible.  Sleep with your head raised (elevated).  Make sure to get enough sleep each night. General instructions  Apply a warm, moist washcloth to your face 3-4  times a day or as told by your health care provider. This will help with discomfort.  Wash your hands often with soap and water to reduce your exposure to viruses and other germs. If soap and water are not available, use hand sanitizer.  Do not smoke. Avoid being around people who are smoking (secondhand smoke).  Keep all follow-up visits as told by your health care provider. This is important. Contact a health care provider if:  You have a fever.  Your symptoms get worse.  Your symptoms do not improve within 10 days. Get help right away if:  You have a severe headache.  You have persistent vomiting.  You have pain or swelling around your face or eyes.  You have vision problems.  You develop confusion.  Your neck is stiff.  You have trouble breathing. This information is not intended to replace advice given to you by your health care provider. Make sure you discuss any questions you have with your health care provider. Document Released: 11/20/2005 Document Revised: 07/16/2016 Document Reviewed: 09/15/2015 Elsevier Interactive Patient Education  2018 [place].

## 2018-04-13 LAB — URINE CULTURE
MICRO NUMBER: 90567428
SPECIMEN QUALITY: ADEQUATE

## 2018-04-15 ENCOUNTER — Other Ambulatory Visit: Payer: Self-pay | Admitting: Surgery

## 2018-04-15 DIAGNOSIS — D0511 Intraductal carcinoma in situ of right breast: Secondary | ICD-10-CM

## 2018-04-20 ENCOUNTER — Ambulatory Visit: Payer: Self-pay | Admitting: Plastic Surgery

## 2018-04-22 NOTE — Pre-Procedure Instructions (Signed)
[place]  04/22/2018      [place], [place] [id number] [address] [place] [address] Phone: [phone] Fax: [phone]    Your procedure is scheduled on Apr 30, 2018.  Report to [facility] Admitting at 6:45 AM.  Call this number if you have problems the morning of surgery:  [phone]   Remember:  No food  Or drink after midnight, except Pre-surgery Ensure drink.  Continue all medications as directed by your physician except follow these medication instructions before surgery below  Please complete your PRE-SURGERY ENSURE that was given to before you leave your house the morning of surgery.  Please, if able, drink it in one setting. DO NOT SIP (finish by 545 AM).   Take these medicines the morning of surgery with A SIP OF WATER  Tylenol-if needed Doxycycline (Vibra) Macrobid Promethazine (phenergan)-if needed for nausea  7 days prior to surgery STOP taking any Aspirin (unless otherwise instructed by your surgeon), Aleve, Naproxen, Ibuprofen, Motrin, Advil, Goody's, BC's, all herbal medications, fish oil, and all vitamins    Do not wear jewelry, make-up or nail polish.  Do not wear lotions, powders, or perfumes, or deodorant.  Do not shave 48 hours prior to surgery.    Do not bring valuables to the hospital.  [facility] is not responsible for any belongings or valuables.  Contacts, dentures or bridgework may not be worn into surgery.  Leave your suitcase in the car.  After surgery it may be brought to your room.  For patients admitted to the hospital, discharge time will be determined by your treatment team.  Patients discharged the day of surgery will not be allowed to drive home.    [facility]- Preparing For Surgery  Before surgery, you can play an important role. Because skin is not sterile, your skin needs to be as free of germs as possible. You can reduce the number of germs on your skin by  washing with CHG (chlorahexidine gluconate) Soap before surgery.  CHG is an antiseptic cleaner which kills germs and bonds with the skin to continue killing germs even after washing.    Oral Hygiene is also important to reduce your risk of infection.  Remember - BRUSH YOUR TEETH THE MORNING OF SURGERY WITH YOUR REGULAR TOOTHPASTE  Please do not use if you have an allergy to CHG or antibacterial soaps. If your skin becomes reddened/irritated stop using the CHG.  Do not shave (including legs and underarms) for at least 48 hours prior to first CHG shower. It is OK to shave your face.  Please follow these instructions carefully.   1. Shower the NIGHT BEFORE SURGERY and the MORNING OF SURGERY with CHG.   2. If you chose to wash your hair, wash your hair first as usual with your normal shampoo.  3. After you shampoo, rinse your hair and body thoroughly to remove the shampoo.  4. Use CHG as you would any other liquid soap. You can apply CHG directly to the skin and wash gently with a scrungie or a clean washcloth.   5. Apply the CHG Soap to your body ONLY FROM THE NECK DOWN.  Do not use on open wounds or open sores. Avoid contact with your eyes, ears, mouth and genitals (private parts). Wash Face and genitals (private parts)  with your normal soap.  6. Wash thoroughly, paying special attention to the area where your surgery will be  performed.  7. Thoroughly rinse your body with warm water from the neck down.  8. DO NOT shower/wash with your normal soap after using and rinsing off the CHG Soap.  9. Pat yourself dry with a CLEAN TOWEL.  10. Wear CLEAN PAJAMAS to bed the night before surgery, wear comfortable clothes the morning of surgery  11. Place CLEAN SHEETS on your bed the night of your first shower and DO NOT SLEEP WITH PETS.  Day of Surgery:  Do not apply any deodorants/lotions.  Please wear clean clothes to the hospital/surgery center.   Remember to brush your teeth WITH YOUR  REGULAR TOOTHPASTE.  Please read over the following fact sheets that you were given. Pain Booklet, Coughing and Deep Breathing and Surgical Site Infection Prevention

## 2018-04-23 ENCOUNTER — Other Ambulatory Visit: Payer: Self-pay

## 2018-04-23 ENCOUNTER — Encounter (HOSPITAL_COMMUNITY): Payer: Self-pay

## 2018-04-23 ENCOUNTER — Encounter (HOSPITAL_COMMUNITY)
Admission: RE | Admit: 2018-04-23 | Discharge: 2018-04-23 | Disposition: A | Discharge status: Home / Self Care | Payer: BLUE CROSS/BLUE SHIELD | Source: Ambulatory Visit | Attending: Surgery | Admitting: Surgery

## 2018-04-23 DIAGNOSIS — Z01812 Encounter for preprocedural laboratory examination: Secondary | ICD-10-CM | POA: Diagnosis not present

## 2018-04-23 HISTORY — DX: Anxiety disorder, unspecified: F41.9

## 2018-04-23 LAB — BASIC METABOLIC PANEL
ANION GAP: 10 (ref 5–15)
BUN: 9 mg/dL (ref 6–20)
CALCIUM: 9.3 mg/dL (ref 8.9–10.3)
CO2: 21 mmol/L — ABNORMAL LOW (ref 22–32)
CREATININE: 0.91 mg/dL (ref 0.44–1.00)
Chloride: 106 mmol/L (ref 101–111)
GLUCOSE: 82 mg/dL (ref 65–99)
Potassium: 4.4 mmol/L (ref 3.5–5.1)
SODIUM: 137 mmol/L (ref 135–145)

## 2018-04-23 LAB — CBC
HEMATOCRIT: 40.9 % (ref 36.0–46.0)
Hemoglobin: 13.3 g/dL (ref 12.0–15.0)
MCH: 29.8 pg (ref 26.0–34.0)
MCHC: 32.5 g/dL (ref 30.0–36.0)
MCV: 91.5 fL (ref 78.0–100.0)
Platelets: 247 10*3/uL (ref 150–400)
RBC: 4.47 MIL/uL (ref 3.87–5.11)
RDW: 12 % (ref 11.5–15.5)
WBC: 7.2 10*3/uL (ref 4.0–10.5)

## 2018-04-23 NOTE — Progress Notes (Signed)
PCP is Dr. Jenna Luo Denies ever seeing a cardiologist. Denies chest pain, cough, or fever. Denies ever having a card cath, stress test, or echo. Refuses Ensure pre-surgical drink.

## 2018-04-24 ENCOUNTER — Telehealth: Payer: Self-pay

## 2018-04-24 NOTE — Telephone Encounter (Signed)
Pt called to find out why scheduling called her to set up a follow up appt with Dr.Gudena, now that she will have bilateral mastectomies next week. She is unsure of why she would still need to see him to discuss antiestrogen medication.   Explained to pt that she will need a follow up 1-2 weeks post surgery to discuss about biopsy that will be collected during surgery and to go over plan of care post surgery. Pt verbalized understanding. She states that she will call our office when the drains are removed because she does not want to leave her home with the drains. She reports how stressful she feels going in surgery next week. Provided comforting words to patient during this phone encounter. Pt will call in the next 2 weeks to call and set up an appt with Dr.Gudena.   Pt knows to call with any additional questions or concerns.

## 2018-04-25 ENCOUNTER — Other Ambulatory Visit (HOSPITAL_COMMUNITY): Payer: BLUE CROSS/BLUE SHIELD

## 2018-04-26 ENCOUNTER — Telehealth: Payer: Self-pay | Admitting: Hematology and Oncology

## 2018-04-26 NOTE — Telephone Encounter (Signed)
Spoke to patient regarding upcoming June appointments. Patient refused to be scheduled for post op visit. Patient stated she will call to make her appointment when her drains are removed.

## 2018-04-27 ENCOUNTER — Ambulatory Visit: Payer: Self-pay | Admitting: Plastic Surgery

## 2018-04-27 DIAGNOSIS — C50919 Malignant neoplasm of unspecified site of unspecified female breast: Secondary | ICD-10-CM
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
## 2018-04-30 ENCOUNTER — Encounter (HOSPITAL_COMMUNITY): Payer: Self-pay | Admitting: *Deleted

## 2018-04-30 ENCOUNTER — Ambulatory Visit (HOSPITAL_COMMUNITY): Payer: BLUE CROSS/BLUE SHIELD | Admitting: Anesthesiology

## 2018-04-30 ENCOUNTER — Encounter (HOSPITAL_COMMUNITY)
Admission: RE | Admit: 2018-04-30 | Discharge: 2018-04-30 | Disposition: A | Discharge status: Home / Self Care | Payer: BLUE CROSS/BLUE SHIELD | Source: Ambulatory Visit | Attending: Surgery | Admitting: Surgery

## 2018-04-30 ENCOUNTER — Observation Stay (HOSPITAL_COMMUNITY)
Admission: RE | Admit: 2018-04-30 | Discharge: 2018-05-02 | Disposition: A | Discharge status: Home / Self Care | Payer: BLUE CROSS/BLUE SHIELD | Source: Ambulatory Visit | Attending: Plastic Surgery | Admitting: Plastic Surgery

## 2018-04-30 ENCOUNTER — Encounter (HOSPITAL_COMMUNITY)
Admission: RE | Disposition: A | Discharge status: Home / Self Care | Payer: Self-pay | Source: Ambulatory Visit | Attending: Plastic Surgery

## 2018-04-30 DIAGNOSIS — F419 Anxiety disorder, unspecified: Secondary | ICD-10-CM | POA: Insufficient documentation

## 2018-04-30 DIAGNOSIS — D225 Melanocytic nevi of trunk: Secondary | ICD-10-CM | POA: Insufficient documentation

## 2018-04-30 DIAGNOSIS — Z87891 Personal history of nicotine dependence: Secondary | ICD-10-CM | POA: Insufficient documentation

## 2018-04-30 DIAGNOSIS — N6489 Other specified disorders of breast: Secondary | ICD-10-CM | POA: Diagnosis not present

## 2018-04-30 DIAGNOSIS — Z79899 Other long term (current) drug therapy: Secondary | ICD-10-CM | POA: Diagnosis not present

## 2018-04-30 DIAGNOSIS — K219 Gastro-esophageal reflux disease without esophagitis: Secondary | ICD-10-CM | POA: Insufficient documentation

## 2018-04-30 DIAGNOSIS — N6022 Fibroadenosis of left breast: Secondary | ICD-10-CM | POA: Insufficient documentation

## 2018-04-30 DIAGNOSIS — G8918 Other acute postprocedural pain: Secondary | ICD-10-CM | POA: Diagnosis not present

## 2018-04-30 DIAGNOSIS — N6012 Diffuse cystic mastopathy of left breast: Secondary | ICD-10-CM | POA: Diagnosis not present

## 2018-04-30 DIAGNOSIS — D0511 Intraductal carcinoma in situ of right breast: Principal | ICD-10-CM | POA: Insufficient documentation

## 2018-04-30 DIAGNOSIS — R921 Mammographic calcification found on diagnostic imaging of breast: Secondary | ICD-10-CM | POA: Diagnosis not present

## 2018-04-30 DIAGNOSIS — C50919 Malignant neoplasm of unspecified site of unspecified female breast: Secondary | ICD-10-CM

## 2018-04-30 DIAGNOSIS — R300 Dysuria: Secondary | ICD-10-CM | POA: Diagnosis not present

## 2018-04-30 DIAGNOSIS — J329 Chronic sinusitis, unspecified: Secondary | ICD-10-CM | POA: Diagnosis not present

## 2018-04-30 DIAGNOSIS — C50911 Malignant neoplasm of unspecified site of right female breast: Secondary | ICD-10-CM | POA: Diagnosis not present

## 2018-04-30 HISTORY — PX: BREAST RECONSTRUCTION WITH PLACEMENT OF TISSUE EXPANDER AND FLEX HD (ACELLULAR HYDRATED DERMIS): SHX6295

## 2018-04-30 HISTORY — PX: MASTECTOMY W/ SENTINEL NODE BIOPSY: SHX2001

## 2018-04-30 LAB — POCT PREGNANCY, URINE: Preg Test, Ur: NEGATIVE

## 2018-04-30 SURGERY — MASTECTOMY WITH SENTINEL LYMPH NODE BIOPSY
Anesthesia: Regional | Site: Breast | Laterality: Bilateral

## 2018-04-30 MED ORDER — ROPIVACAINE HCL 7.5 MG/ML IJ SOLN
INTRAMUSCULAR | Status: DC | PRN
  Administered 2018-04-30 (×2): 15 mL via PERINEURAL

## 2018-04-30 MED ORDER — LORAZEPAM 2 MG/ML IJ SOLN: 1.0000 mg | Freq: Once | INTRAMUSCULAR | Status: DC | PRN

## 2018-04-30 MED ORDER — OXYCODONE HCL 5 MG/5ML PO SOLN: 5.0000 mg | Freq: Once | ORAL | Status: DC | PRN

## 2018-04-30 MED ORDER — DIAZEPAM 2 MG PO TABS: 2.0000 mg | ORAL_TABLET | Freq: Two times a day (BID) | ORAL | Status: DC | PRN

## 2018-04-30 MED ORDER — TRAMADOL HCL 50 MG PO TABS
50.0000 mg | ORAL_TABLET | Freq: Three times a day (TID) | ORAL | Status: DC | PRN
  Administered 2018-05-01 – 2018-05-02 (×4): 50 mg via ORAL
  Filled 2018-04-30 (×4): qty 1

## 2018-04-30 MED ORDER — SUGAMMADEX SODIUM 200 MG/2ML IV SOLN
INTRAVENOUS | Status: DC | PRN
  Administered 2018-04-30: 200 mg via INTRAVENOUS

## 2018-04-30 MED ORDER — PROPOFOL 10 MG/ML IV BOLUS
INTRAVENOUS | Status: DC | PRN
  Administered 2018-04-30 (×2): 100 mg via INTRAVENOUS

## 2018-04-30 MED ORDER — DEXTROSE 5 % IV SOLN
INTRAVENOUS | Status: DC | PRN
  Administered 2018-04-30: 25 ug/min via INTRAVENOUS

## 2018-04-30 MED ORDER — CHLORHEXIDINE GLUCONATE CLOTH 2 % EX PADS: 6.0000 | MEDICATED_PAD | Freq: Once | CUTANEOUS | Status: DC

## 2018-04-30 MED ORDER — PROPRANOLOL HCL 10 MG PO TABS
10.0000 mg | ORAL_TABLET | Freq: Every day | ORAL | Status: DC
  Administered 2018-04-30 – 2018-05-01 (×2): 10 mg via ORAL
  Filled 2018-04-30 (×2): qty 1

## 2018-04-30 MED ORDER — PROMETHAZINE HCL 25 MG/ML IJ SOLN
6.2500 mg | INTRAMUSCULAR | Status: AC | PRN
  Administered 2018-04-30 (×2): 6.25 mg via INTRAVENOUS

## 2018-04-30 MED ORDER — DEXAMETHASONE SODIUM PHOSPHATE 10 MG/ML IJ SOLN
INTRAMUSCULAR | Status: AC
  Filled 2018-04-30: qty 1

## 2018-04-30 MED ORDER — DIPHENHYDRAMINE HCL 50 MG/ML IJ SOLN
INTRAMUSCULAR | Status: AC
Start: 2018-04-30 — End: ?
  Filled 2018-04-30: qty 1

## 2018-04-30 MED ORDER — METHYLENE BLUE 0.5 % INJ SOLN
INTRAVENOUS | Status: AC
  Filled 2018-04-30: qty 10

## 2018-04-30 MED ORDER — ROCURONIUM BROMIDE 100 MG/10ML IV SOLN
INTRAVENOUS | Status: DC | PRN
  Administered 2018-04-30: 60 mg via INTRAVENOUS
  Administered 2018-04-30: 20 mg via INTRAVENOUS

## 2018-04-30 MED ORDER — TECHNETIUM TC 99M SULFUR COLLOID FILTERED
1.0000 | Freq: Once | INTRAVENOUS | Status: AC | PRN
  Administered 2018-04-30: 1 via INTRADERMAL

## 2018-04-30 MED ORDER — ONDANSETRON 4 MG PO TBDP: 4.0000 mg | ORAL_TABLET | Freq: Four times a day (QID) | ORAL | Status: DC | PRN

## 2018-04-30 MED ORDER — PANTOPRAZOLE SODIUM 40 MG PO TBEC
80.0000 mg | DELAYED_RELEASE_TABLET | Freq: Every day | ORAL | Status: DC
  Administered 2018-04-30 – 2018-05-02 (×3): 80 mg via ORAL
  Filled 2018-04-30 (×4): qty 2

## 2018-04-30 MED ORDER — LORAZEPAM 2 MG/ML IJ SOLN
INTRAMUSCULAR | Status: AC
  Filled 2018-04-30: qty 1

## 2018-04-30 MED ORDER — GABAPENTIN 300 MG PO CAPS
300.0000 mg | ORAL_CAPSULE | ORAL | Status: AC
  Administered 2018-04-30: 300 mg via ORAL
  Filled 2018-04-30: qty 1

## 2018-04-30 MED ORDER — MIDAZOLAM HCL 2 MG/2ML IJ SOLN
INTRAMUSCULAR | Status: AC
  Filled 2018-04-30: qty 2

## 2018-04-30 MED ORDER — HYDROCODONE-ACETAMINOPHEN 5-325 MG PO TABS
1.0000 | ORAL_TABLET | Freq: Four times a day (QID) | ORAL | Status: DC | PRN
  Filled 2018-04-30: qty 1

## 2018-04-30 MED ORDER — HYDROMORPHONE HCL 2 MG/ML IJ SOLN
0.5000 mg | INTRAMUSCULAR | Status: DC | PRN
  Administered 2018-04-30 – 2018-05-01 (×5): 0.5 mg via INTRAVENOUS
  Filled 2018-04-30 (×6): qty 1

## 2018-04-30 MED ORDER — ACETAMINOPHEN 160 MG/5ML PO SOLN: 325.0000 mg | ORAL | Status: DC | PRN

## 2018-04-30 MED ORDER — FENTANYL CITRATE (PF) 100 MCG/2ML IJ SOLN
INTRAMUSCULAR | Status: DC | PRN
  Administered 2018-04-30 (×2): 50 ug via INTRAVENOUS
  Administered 2018-04-30: 150 ug via INTRAVENOUS
  Administered 2018-04-30 (×2): 50 ug via INTRAVENOUS

## 2018-04-30 MED ORDER — PROPOFOL 10 MG/ML IV BOLUS
INTRAVENOUS | Status: AC
  Filled 2018-04-30: qty 20

## 2018-04-30 MED ORDER — ONDANSETRON HCL 4 MG/2ML IJ SOLN
INTRAMUSCULAR | Status: DC | PRN
  Administered 2018-04-30: 4 mg via INTRAVENOUS

## 2018-04-30 MED ORDER — ZOLPIDEM TARTRATE 5 MG PO TABS: 5.0000 mg | ORAL_TABLET | Freq: Every evening | ORAL | Status: DC | PRN

## 2018-04-30 MED ORDER — ONDANSETRON HCL 4 MG/2ML IJ SOLN
4.0000 mg | Freq: Four times a day (QID) | INTRAMUSCULAR | Status: DC | PRN
  Filled 2018-04-30: qty 2

## 2018-04-30 MED ORDER — SODIUM CHLORIDE 0.9 % IV SOLN
INTRAVENOUS | Status: AC
  Filled 2018-04-30: qty 500000

## 2018-04-30 MED ORDER — 0.9 % SODIUM CHLORIDE (POUR BTL) OPTIME
TOPICAL | Status: DC | PRN
  Administered 2018-04-30 (×2): 1000 mL

## 2018-04-30 MED ORDER — DIPHENHYDRAMINE HCL 50 MG/ML IJ SOLN
INTRAMUSCULAR | Status: DC | PRN
  Administered 2018-04-30: 12.5 mg via INTRAVENOUS

## 2018-04-30 MED ORDER — HYDROMORPHONE HCL 2 MG/ML IJ SOLN
INTRAMUSCULAR | Status: AC
  Administered 2018-04-30: 2 mg
  Filled 2018-04-30: qty 1

## 2018-04-30 MED ORDER — VANCOMYCIN HCL IN DEXTROSE 1-5 GM/200ML-% IV SOLN
1000.0000 mg | INTRAVENOUS | Status: AC
  Administered 2018-04-30: 1000 mg via INTRAVENOUS
  Filled 2018-04-30: qty 200

## 2018-04-30 MED ORDER — PROPOFOL 500 MG/50ML IV EMUL
INTRAVENOUS | Status: DC | PRN
  Administered 2018-04-30: 25 ug/kg/min via INTRAVENOUS

## 2018-04-30 MED ORDER — HYDROMORPHONE HCL 1 MG/ML IJ SOLN: 0.5000 mg | INTRAMUSCULAR | Status: DC | PRN

## 2018-04-30 MED ORDER — HYDROCODONE-ACETAMINOPHEN 5-325 MG PO TABS
ORAL_TABLET | ORAL | Status: AC
  Filled 2018-04-30: qty 1

## 2018-04-30 MED ORDER — SUGAMMADEX SODIUM 200 MG/2ML IV SOLN
INTRAVENOUS | Status: AC
  Filled 2018-04-30: qty 2

## 2018-04-30 MED ORDER — SODIUM CHLORIDE 0.9 % IV SOLN
INTRAVENOUS | Status: DC | PRN
  Administered 2018-04-30: 500 mL

## 2018-04-30 MED ORDER — OXYCODONE HCL 5 MG PO TABS: 5.0000 mg | ORAL_TABLET | Freq: Once | ORAL | Status: DC | PRN

## 2018-04-30 MED ORDER — METRONIDAZOLE IN NACL 5-0.79 MG/ML-% IV SOLN
500.0000 mg | Freq: Three times a day (TID) | INTRAVENOUS | Status: DC
  Administered 2018-04-30 – 2018-05-02 (×6): 500 mg via INTRAVENOUS
  Filled 2018-04-30 (×7): qty 100

## 2018-04-30 MED ORDER — IBUPROFEN 800 MG PO TABS
800.0000 mg | ORAL_TABLET | Freq: Three times a day (TID) | ORAL | Status: DC | PRN
  Administered 2018-04-30 – 2018-05-02 (×5): 800 mg via ORAL
  Filled 2018-04-30 (×6): qty 1

## 2018-04-30 MED ORDER — ONDANSETRON HCL 4 MG/2ML IJ SOLN
INTRAMUSCULAR | Status: AC
  Filled 2018-04-30: qty 2

## 2018-04-30 MED ORDER — FENTANYL CITRATE (PF) 250 MCG/5ML IJ SOLN
INTRAMUSCULAR | Status: AC
  Filled 2018-04-30: qty 5

## 2018-04-30 MED ORDER — POLYETHYLENE GLYCOL 3350 17 G PO PACK: 17.0000 g | PACK | Freq: Every day | ORAL | Status: DC | PRN

## 2018-04-30 MED ORDER — METRONIDAZOLE IN NACL 5-0.79 MG/ML-% IV SOLN
500.0000 mg | INTRAVENOUS | Status: AC
  Administered 2018-04-30: 500 mg via INTRAVENOUS
  Filled 2018-04-30: qty 100

## 2018-04-30 MED ORDER — ACETAMINOPHEN 325 MG PO TABS: 325.0000 mg | ORAL_TABLET | ORAL | Status: DC | PRN

## 2018-04-30 MED ORDER — LIDOCAINE 2% (20 MG/ML) 5 ML SYRINGE
INTRAMUSCULAR | Status: AC
  Filled 2018-04-30: qty 5

## 2018-04-30 MED ORDER — DEXAMETHASONE SODIUM PHOSPHATE 10 MG/ML IJ SOLN
INTRAMUSCULAR | Status: DC | PRN
  Administered 2018-04-30: 10 mg via INTRAVENOUS

## 2018-04-30 MED ORDER — BUPIVACAINE-EPINEPHRINE (PF) 0.5% -1:200000 IJ SOLN
INTRAMUSCULAR | Status: DC | PRN
  Administered 2018-04-30 (×2): 15 mL via PERINEURAL

## 2018-04-30 MED ORDER — PROPOFOL 1000 MG/100ML IV EMUL
INTRAVENOUS | Status: AC
  Filled 2018-04-30: qty 100

## 2018-04-30 MED ORDER — FENTANYL CITRATE (PF) 100 MCG/2ML IJ SOLN: 25.0000 ug | INTRAMUSCULAR | Status: DC | PRN

## 2018-04-30 MED ORDER — PROMETHAZINE HCL 25 MG/ML IJ SOLN
INTRAMUSCULAR | Status: AC
  Filled 2018-04-30: qty 1

## 2018-04-30 MED ORDER — LACTATED RINGERS IV SOLN
INTRAVENOUS | Status: DC
  Administered 2018-04-30 – 2018-05-02 (×2): via INTRAVENOUS

## 2018-04-30 MED ORDER — SENNA 8.6 MG PO TABS
1.0000 | ORAL_TABLET | Freq: Two times a day (BID) | ORAL | Status: DC
  Administered 2018-04-30 – 2018-05-02 (×4): 8.6 mg via ORAL
  Filled 2018-04-30 (×4): qty 1

## 2018-04-30 MED ORDER — SODIUM CHLORIDE 0.9 % IR SOLN
Status: DC | PRN
  Administered 2018-04-30: 1000 mL

## 2018-04-30 MED ORDER — SODIUM CHLORIDE 0.9 % IJ SOLN
INTRAMUSCULAR | Status: AC
  Filled 2018-04-30: qty 10

## 2018-04-30 MED ORDER — MIDAZOLAM HCL 5 MG/5ML IJ SOLN
INTRAMUSCULAR | Status: DC | PRN
  Administered 2018-04-30: 2 mg via INTRAVENOUS

## 2018-04-30 SURGICAL SUPPLY — 79 items
APPLIER CLIP 9.375 MED OPEN (MISCELLANEOUS) ×2
BAG DECANTER FOR FLEXI CONT (MISCELLANEOUS) ×2 IMPLANT
BINDER BREAST LRG (GAUZE/BANDAGES/DRESSINGS) ×1 IMPLANT
BINDER BREAST XLRG (GAUZE/BANDAGES/DRESSINGS) IMPLANT
BIOPATCH RED 1 DISK 7.0 (GAUZE/BANDAGES/DRESSINGS) ×4 IMPLANT
CANISTER SUCT 3000ML PPV (MISCELLANEOUS) ×3 IMPLANT
CHLORAPREP W/TINT 26ML (MISCELLANEOUS) ×3 IMPLANT
CLIP APPLIE 9.375 MED OPEN (MISCELLANEOUS) ×1 IMPLANT
CONT SPEC 4OZ CLIKSEAL STRL BL (MISCELLANEOUS) ×2 IMPLANT
COVER PROBE W GEL 5X96 (DRAPES) ×2 IMPLANT
COVER SURGICAL LIGHT HANDLE (MISCELLANEOUS) ×4 IMPLANT
DERMABOND ADVANCED (GAUZE/BANDAGES/DRESSINGS) ×1
DERMABOND ADVANCED .7 DNX12 (GAUZE/BANDAGES/DRESSINGS) ×2 IMPLANT
DRAIN CHANNEL 19F RND (DRAIN) ×4 IMPLANT
DRAPE HALF SHEET 40X57 (DRAPES) ×2 IMPLANT
DRAPE LAPAROSCOPIC ABDOMINAL (DRAPES) ×1 IMPLANT
DRAPE ORTHO SPLIT 77X108 STRL (DRAPES) ×2
DRAPE SURG 17X23 STRL (DRAPES) ×9 IMPLANT
DRAPE SURG ORHT 6 SPLT 77X108 (DRAPES) ×2 IMPLANT
DRAPE UTILITY XL STRL (DRAPES) ×2 IMPLANT
DRAPE WARM FLUID 44X44 (DRAPE) ×1 IMPLANT
ELECT BLADE 4.0 EZ CLEAN MEGAD (MISCELLANEOUS)
ELECT REM PT RETURN 9FT ADLT (ELECTROSURGICAL) ×2
ELECTRODE BLDE 4.0 EZ CLN MEGD (MISCELLANEOUS) IMPLANT
ELECTRODE REM PT RTRN 9FT ADLT (ELECTROSURGICAL) ×2 IMPLANT
EVACUATOR SILICONE 100CC (DRAIN) ×4 IMPLANT
GAUZE SPONGE 4X4 12PLY STRL (GAUZE/BANDAGES/DRESSINGS) ×2 IMPLANT
GAUZE SPONGE 4X4 12PLY STRL LF (GAUZE/BANDAGES/DRESSINGS) ×1 IMPLANT
GLOVE BIO SURGEON STRL SZ 6.5 (GLOVE) ×4 IMPLANT
GLOVE BIOGEL PI IND STRL 6.5 (GLOVE) IMPLANT
GLOVE BIOGEL PI IND STRL 8 (GLOVE) IMPLANT
GLOVE BIOGEL PI INDICATOR 6.5 (GLOVE) ×3
GLOVE BIOGEL PI INDICATOR 8 (GLOVE) ×2
GLOVE SURG SIGNA 7.5 PF LTX (GLOVE) ×1 IMPLANT
GLOVE SURG SS PI 6.5 STRL IVOR (GLOVE) ×5 IMPLANT
GOWN STRL REUS W/ TWL LRG LVL3 (GOWN DISPOSABLE) ×4 IMPLANT
GOWN STRL REUS W/ TWL XL LVL3 (GOWN DISPOSABLE) ×1 IMPLANT
GOWN STRL REUS W/TWL LRG LVL3 (GOWN DISPOSABLE) ×6
GOWN STRL REUS W/TWL XL LVL3 (GOWN DISPOSABLE) ×2
GRAFT FLEX HD 4X16 THICK (Tissue Mesh) ×2 IMPLANT
IMPL EXPANDER BREAST 455CC (Breast) IMPLANT
IMPLANT BREAST 455CC (Breast) ×2 IMPLANT
IMPLANT EXPANDER BREAST 455CC (Breast) ×2 IMPLANT
KIT BASIN OR (CUSTOM PROCEDURE TRAY) ×4 IMPLANT
KIT TURNOVER KIT B (KITS) ×3 IMPLANT
NDL 18GX1X1/2 (RX/OR ONLY) (NEEDLE) ×1 IMPLANT
NDL FILTER BLUNT 18X1 1/2 (NEEDLE) IMPLANT
NDL HYPO 25GX1X1/2 BEV (NEEDLE) ×1 IMPLANT
NEEDLE 18GX1X1/2 (RX/OR ONLY) (NEEDLE) IMPLANT
NEEDLE FILTER BLUNT 18X 1/2SAF (NEEDLE)
NEEDLE FILTER BLUNT 18X1 1/2 (NEEDLE) IMPLANT
NEEDLE HYPO 25GX1X1/2 BEV (NEEDLE) IMPLANT
NS IRRIG 1000ML POUR BTL (IV SOLUTION) ×6 IMPLANT
PACK GENERAL/GYN (CUSTOM PROCEDURE TRAY) ×4 IMPLANT
PAD ABD 8X10 STRL (GAUZE/BANDAGES/DRESSINGS) ×4 IMPLANT
PAD ARMBOARD 7.5X6 YLW CONV (MISCELLANEOUS) ×4 IMPLANT
PIN SAFETY STERILE (MISCELLANEOUS) ×2 IMPLANT
SET ASEPTIC TRANSFER (MISCELLANEOUS) ×1 IMPLANT
SPECIMEN JAR X LARGE (MISCELLANEOUS) ×3 IMPLANT
SPONGE LAP 18X18 5 PK (GAUZE/BANDAGES/DRESSINGS) ×1 IMPLANT
STAPLER VISISTAT 35W (STAPLE) ×1 IMPLANT
SUT ETHILON 3 0 FSL (SUTURE) ×1 IMPLANT
SUT MNCRL AB 4-0 PS2 18 (SUTURE) ×5 IMPLANT
SUT MON AB 3-0 SH 27 (SUTURE) ×3
SUT MON AB 3-0 SH27 (SUTURE) ×2 IMPLANT
SUT MON AB 4-0 PC3 18 (SUTURE) ×1 IMPLANT
SUT MON AB 5-0 PS2 18 (SUTURE) ×4 IMPLANT
SUT PDS AB 2-0 CT1 27 (SUTURE) ×9 IMPLANT
SUT SILK 2 0 SH (SUTURE) ×2 IMPLANT
SUT SILK 4 0 PS 2 (SUTURE) ×3 IMPLANT
SUT VIC AB 3-0 SH 18 (SUTURE) ×1 IMPLANT
SUT VIC AB 3-0 SH 27 (SUTURE) ×1
SUT VIC AB 3-0 SH 27XBRD (SUTURE) ×1 IMPLANT
SYR CONTROL 10ML LL (SYRINGE) ×1 IMPLANT
TOWEL OR 17X24 6PK STRL BLUE (TOWEL DISPOSABLE) ×3 IMPLANT
TOWEL OR 17X26 10 PK STRL BLUE (TOWEL DISPOSABLE) ×3 IMPLANT
TRAY FOL W/BAG SLVR 16FR STRL (SET/KITS/TRAYS/PACK) IMPLANT
TRAY FOLEY MTR SLVR 14FR STAT (SET/KITS/TRAYS/PACK) ×1 IMPLANT
TRAY FOLEY W/BAG SLVR 16FR LF (SET/KITS/TRAYS/PACK) ×1

## 2018-04-30 NOTE — Op Note (Signed)
Op report    DATE OF OPERATION:  04/30/2018  LOCATION: [facility] Operating Room  SURGICAL DIVISION: Plastic Surgery  PREOPERATIVE DIAGNOSES:  1. Right Breast cancer.    POSTOPERATIVE DIAGNOSES:  1. Right Breast cancer.   PROCEDURE:  1. Bilateral immediate breast reconstruction with placement of Acellular Dermal Matrix and tissue expanders.  SURGEON: Claire Sanger Dillingham, DO  ANESTHESIA:  General.   COMPLICATIONS: None.   IMPLANTS: Left - Mentor 455 cc. Ref #[id number].  Serial Number [id number], 100 cc of injectable saline placed in the expander. Right - Mentor 455 cc. Ref #[id number].  Serial Number [id number], 100 cc of injectable saline placed in the expander. Acellular Dermal Matrix 4 x 16 cm  INDICATIONS FOR PROCEDURE:  The patient, Ashley [name], is a 42 y.o. female born on 04/16/1976, is here for  immediate first stage breast reconstruction with placement of bilateral tissue expander and Acellular dermal matrix. MRN: [id number]  CONSENT:  Informed consent was obtained directly from the patient. Risks, benefits and alternatives were fully discussed. Specific risks including but not limited to bleeding, infection, hematoma, seroma, scarring, pain, implant infection, implant extrusion, capsular contracture, asymmetry, wound healing problems, and need for further surgery were all discussed. The patient did have an ample opportunity to have her questions answered to her satisfaction.   DESCRIPTION OF PROCEDURE:  The patient was taken to the operating room by the general surgery team. SCDs were placed and IV antibiotics were given. The patient's chest was prepped and draped in a sterile fashion. A time out was performed and the implants to be used were identified.  Bilateral mastectomies were performed.  Once the general surgery team had completed their portion of the case the patient was rendered to the plastic and reconstructive surgery team.  Left:  The pectoralis  major muscle was lifted from the chest wall with release of the lateral edge and lateral inframammary fold.  The pocket was irrigated with antibiotic solution and hemostasis was achieved with electrocautery.  The ADM was then prepared according to the manufacture guidelines and slits placed to help with postoperative fluid management.  There was a 3 cm area were the pectoralis muscle was torn.  This was repaired with the 3-0 Vicryl suture.  The ADM was then sutured to the inferior and lateral edge of the inframammary fold with 2-0 PDS starting with an interrupted stitch and then a running stitch.  The lateral portion was sutured to with interrupted sutures after the expander was placed.  The expander was prepared according to the manufacture guidelines, the air evacuated and then it was placed under the ADM and pectoralis major muscle.  The inferior and lateral tabs were used to secure the expander to the chest wall with 2-0 PDS.  The drain was placed at the inframammary fold over the ADM and secured to the skin with 3-0 Silk. The deep layers were closed with 3-0 Monocryl followed by 4-0 Monocryl.  The skin was closed with 5-0 Monocryl and then dermabond was applied.   Right:  The pectoralis major muscle was lifted from the chest wall with release of the lateral edge and lateral inframammary fold.  The pocket was irrigated with antibiotic solution and hemostasis was achieved with electrocautery.  There was a 3 cm area were the pectoralis muscle was torn.  This was repaired with the 3-0 Vicryl suture. The ADM was then prepared according to the manufacture guidelines and slits placed to help with postoperative fluid management.  The ADM was  then sutured to the inferior and lateral edge of the inframammary fold with 2-0 PDS starting with an interrupted stitch and then a running stitch.  The lateral portion was sutured to with interrupted sutures after the expander was placed.  The expander was prepared according to  the manufacture guidelines, the air evacuated and then it was placed under the ADM and pectoralis major muscle.  The inferior and lateral tabs were used to secure the expander to the chest wall with 2-0 PDS.  The drain was placed at the inframammary fold over the ADM and secured to the skin with 3-0 Silk.  The deep layers were closed with 3-0 Monocryl followed by 4-0 Monocryl.  The skin was closed with 5-0 Monocryl and then dermabond was applied.  The ABDs and breast binder were placed.  The patient tolerated the procedure well and there were no complications.  The patient was allowed to wake from anesthesia and taken to the recovery room in satisfactory condition.
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
## 2018-04-30 NOTE — Progress Notes (Signed)
Urine preg neg.  

## 2018-04-30 NOTE — Anesthesia Preprocedure Evaluation (Addendum)
Anesthesia Evaluation  Patient identified by MRN, date of birth, ID band Patient awake    Reviewed: Allergy & Precautions, NPO status , Patient's Chart, lab work & pertinent test results  History of Anesthesia Complications (+) PONV and history of anesthetic complications  Airway Mallampati: II  TM Distance: >3 FB Neck ROM: Full    Dental  (+) Teeth Intact, Chipped,    Pulmonary former smoker,    breath sounds clear to auscultation       Cardiovascular  Rhythm:Regular     Neuro/Psych  Headaches, PSYCHIATRIC DISORDERS Anxiety  Neuromuscular disease    GI/Hepatic Neg liver ROS, GERD  Medicated and Controlled,  Endo/Other  negative endocrine ROS  Renal/GU negative Renal ROS     Musculoskeletal   Abdominal   Peds  Hematology  (+) anemia ,   Anesthesia Other Findings   Reproductive/Obstetrics                            Anesthesia Physical Anesthesia Plan  ASA: II  Anesthesia Plan: General and Regional   Post-op Pain Management:  Regional for Post-op pain   Induction: Intravenous  PONV Risk Score and Plan: 4 or greater and Ondansetron, Dexamethasone and Propofol infusion  Airway Management Planned: Oral ETT  Additional Equipment: None  Intra-op Plan:   Post-operative Plan: Extubation in OR  Informed Consent: I have reviewed the patients History and Physical, chart, labs and discussed the procedure including the risks, benefits and alternatives for the proposed anesthesia with the patient or authorized representative who has indicated his/her understanding and acceptance.   Dental advisory given  Plan Discussed with: CRNA and Surgeon  Anesthesia Plan Comments:         Anesthesia Quick Evaluation

## 2018-04-30 NOTE — Anesthesia Procedure Notes (Signed)
Anesthesia Regional Block: Pectoralis block   Pre-Anesthetic Checklist: ,, timeout performed, Correct Patient, Correct Site, Correct Laterality, Correct Procedure, Correct Position, site marked, Risks and benefits discussed,  Surgical consent,  Pre-op evaluation,  At surgeon's request and post-op pain management  Laterality: Left  Prep: chloraprep       Needles:  Injection technique: Single-shot  Needle Type: Echogenic Needle          Additional Needles:   Procedures:,,,, ultrasound used (permanent image in chart),,,,  Narrative:  Start time: 04/30/2018 9:14 AM End time: 04/30/2018 9:18 AM Injection made incrementally with aspirations every 5 mL.  Performed by: Personally   Additional Notes: H+P and labs reviewed, risks and benefits discussed with patient, procedure tolerated well without complications

## 2018-04-30 NOTE — Anesthesia Procedure Notes (Signed)
Procedure Name: Intubation Date/Time: 04/30/2018 9:23 AM Performed by: Kyung Rudd, CRNA Pre-anesthesia Checklist: Patient identified, Emergency Drugs available, Suction available and Patient being monitored Patient Re-evaluated:Patient Re-evaluated prior to induction Oxygen Delivery Method: Circle system utilized Preoxygenation: Pre-oxygenation with 100% oxygen Induction Type: IV induction Ventilation: Mask ventilation without difficulty Laryngoscope Size: Mac and 3 Grade View: Grade I Tube type: Oral Tube size: 7.0 mm Number of attempts: 1 Airway Equipment and Method: Stylet Placement Confirmation: ETT inserted through vocal cords under direct vision,  positive ETCO2 and breath sounds checked- equal and bilateral Secured at: 21 cm Tube secured with: Tape Dental Injury: Teeth and Oropharynx as per pre-operative assessment

## 2018-04-30 NOTE — Progress Notes (Signed)
Transferred from PACU via bed , IVF infusing well. Awake but sleepy. Dressings and breast binder intact., JP drains intact

## 2018-04-30 NOTE — Interval H&P Note (Signed)
History and Physical Interval Note: no change in H and p  04/30/2018 8:08 AM  Ashley [name]  has presented today for surgery, with the diagnosis of RIGHT BREAST CANCER  The various methods of treatment have been discussed with the patient and family. After consideration of risks, benefits and other options for treatment, the patient has consented to  Procedure(s): BILATERAL MASTECTOMIES WITH RIGHT SENTINEL LYMPH NODE BIOPSY (Bilateral) BREAST RECONSTRUCTION WITH PLACEMENT OF TISSUE EXPANDER AND FLEX HD (ACELLULAR HYDRATED DERMIS) (Bilateral) as a surgical intervention .  The patient's history has been reviewed, patient examined, no change in status, stable for surgery.  I have reviewed the patient's chart and labs.  Questions were answered to the patient's satisfaction.     [name] A

## 2018-04-30 NOTE — Anesthesia Procedure Notes (Signed)
Anesthesia Regional Block: Pectoralis block   Pre-Anesthetic Checklist: ,, timeout performed, Correct Patient, Correct Site, Correct Laterality, Correct Procedure, Correct Position, site marked, Risks and benefits discussed,  Surgical consent,  Pre-op evaluation,  At surgeon's request and post-op pain management  Laterality: Right  Prep: chloraprep       Needles:  Injection technique: Single-shot  Needle Type: Echogenic Needle          Additional Needles:   Procedures:,,,, ultrasound used (permanent image in chart),,,,  Narrative:  Start time: 04/30/2018 9:21 AM End time: 04/30/2018 9:26 AM Injection made incrementally with aspirations every 5 mL.  Performed by: Personally   Additional Notes: H+P and labs reviewed, risks and benefits discussed with patient, procedure tolerated well without complications

## 2018-04-30 NOTE — Transfer of Care (Signed)
Immediate Anesthesia Transfer of Care Note  Patient: Ashley [name]  Procedure(s) Performed: BILATERAL MASTECTOMIES WITH RIGHT SENTINEL LYMPH NODE BIOPSY (Bilateral Breast) BREAST RECONSTRUCTION WITH PLACEMENT OF TISSUE EXPANDER AND FLEX HD (ACELLULAR HYDRATED DERMIS) (Bilateral Breast)  Patient Location: PACU  Anesthesia Type:GA combined with regional for post-op pain  Level of Consciousness: awake, alert  and oriented  Airway & Oxygen Therapy: Patient Spontanous Breathing and Patient connected to nasal cannula oxygen  Post-op Assessment: Report given to RN, Post -op Vital signs reviewed and stable and Patient moving all extremities  Post vital signs: Reviewed and stable  Last Vitals:  Vitals Value Taken Time  BP 136/91 04/30/2018 12:42 PM  Temp    Pulse 107 04/30/2018 12:46 PM  Resp 19 04/30/2018 12:46 PM  SpO2 100 % 04/30/2018 12:46 PM  Vitals shown include unvalidated device data.  Last Pain:  Vitals:   04/30/18 0833  TempSrc: Oral  PainSc:       Patients Stated Pain Goal: 1 (57/32/20 2542)  Complications: No apparent anesthesia complications

## 2018-04-30 NOTE — Op Note (Signed)
BILATERAL MASTECTOMIES WITH RIGHT SENTINEL LYMPH NODE BIOPSY  Procedure Note  Ashley Ashley [name] 04/30/2018   Pre-op Diagnosis: RIGHT BREAST DCIS     Post-op Diagnosis: same  Procedure(s): BILATERAL MASTECTOMIES WITH RIGHT DEEP AXILLARY SENTINEL LYMPH NODE BIOPSY   Surgeon: Dr. Coralie Keens  Anesthesia: General  Staff:  Circulator: Beryle Lathe, RN Scrub Person: Deland Pretty, RN; Para Skeans, Phylliss Bob Circulator Assistant: Deland Pretty, RN  Estimated Blood Loss: less than 50 mL               Specimens: sent to path  Indications: This is Ashley [name] 42 year old female who presented with ductal carcinoma in situ of the right breast.  Her initial lumpectomy showed positive margins.  She had 2 more re-excisions with an extensive amount of DCIS found in both of those with again positive margins.  MRI of bilateral breasts were otherwise unremarkable.  Her genetics was negative.  Because of greater than 5 cm of DCIS found on the multiple biopsies, the decision was made to proceed with bilateral mastectomies.  Findings: 2 deep right axillary sentinel lymph nodes were identified and removed along with bilateral mastectomies  Procedure: The patient was brought to the operating room and identified as the correct patient.  She was placed supine on the operating room table and general anesthesia was induced.  Anesthesia performed bilateral pectoralis nerve blocks.  Her chest axilla and breast were then prepped and draped in the usual sterile fashion.  I marked with Ashley [name] marker pen both breast for the incisions which appear symmetric.  I then made an elliptical incision on the left breast first incorporating the nipple areolar complex and Ashley [name] large mole.  I dissected down to the breast tissue with electrocautery.  I then performed Ashley [name] total mastectomy of the right breast.  I first created superior skin flaps with the cautery going down to the chest wall and then an inferior flap going down  to the inframammary ridge.  The flaps appeared symmetric.  I then dissected the rest of the breast tissue off the chest wall moving medial to lateral with the electrocautery going past the pectoralis muscle.  Once the mastectomy specimen was removed, I marked the lateral breast with Ashley [name] silk suture for identification purposes.  Hemostasis was achieved with the cautery.  I then left to laparotomy pads in the mastectomy site.  I then turned my attention toward the right breast.  I again made an elliptical incision incorporating the nipple areolar complex and the patient's circumareolar incision from the previous surgeries.  I then dissected down to the breast tissue with the cautery.  I again created inferior and superior skin flaps and was able to stay out of the previous lumpectomy site.  I again dissected down to the chest wall superiorly and the inframammary ridge inferiorly.  I then took the dissection around toward the axilla.  I then completed the mastectomy excising the breast tissue off the pectoralis fascia being medial to lateral reaching the axilla.  I again marked the specimen with Ashley [name] suture.  The neoprobe was then brought into the field.  Identified an area of increased uptake in the deep right axilla.  Identified 2 sentinel lymph nodes which were excised and sent to pathology for evaluation.  I then irrigated both incisions with saline.  Hemostasis appeared to be achieved.  Again all flaps appeared viable.  I then replaced again 2 laparotomy pads in each wound.  At this point Dr. Marla Roe  presented into the room to continue the procedure.          Ashley Ashley [name]   Date: 04/30/2018  Time: 10:56 AM

## 2018-05-01 ENCOUNTER — Encounter (HOSPITAL_COMMUNITY): Payer: Self-pay | Admitting: General Practice

## 2018-05-01 ENCOUNTER — Other Ambulatory Visit: Payer: Self-pay

## 2018-05-01 DIAGNOSIS — Z79899 Other long term (current) drug therapy: Secondary | ICD-10-CM | POA: Diagnosis not present

## 2018-05-01 DIAGNOSIS — K219 Gastro-esophageal reflux disease without esophagitis: Secondary | ICD-10-CM | POA: Diagnosis not present

## 2018-05-01 DIAGNOSIS — D225 Melanocytic nevi of trunk: Secondary | ICD-10-CM | POA: Diagnosis not present

## 2018-05-01 DIAGNOSIS — C50911 Malignant neoplasm of unspecified site of right female breast: Secondary | ICD-10-CM | POA: Diagnosis not present

## 2018-05-01 DIAGNOSIS — F419 Anxiety disorder, unspecified: Secondary | ICD-10-CM | POA: Diagnosis not present

## 2018-05-01 DIAGNOSIS — Z87891 Personal history of nicotine dependence: Secondary | ICD-10-CM | POA: Diagnosis not present

## 2018-05-01 DIAGNOSIS — D0511 Intraductal carcinoma in situ of right breast: Secondary | ICD-10-CM | POA: Diagnosis not present

## 2018-05-01 DIAGNOSIS — N6022 Fibroadenosis of left breast: Secondary | ICD-10-CM | POA: Diagnosis not present

## 2018-05-01 LAB — BASIC METABOLIC PANEL
ANION GAP: 9 (ref 5–15)
BUN: 9 mg/dL (ref 6–20)
CHLORIDE: 102 mmol/L (ref 101–111)
CO2: 24 mmol/L (ref 22–32)
CREATININE: 0.86 mg/dL (ref 0.44–1.00)
Calcium: 8.8 mg/dL — ABNORMAL LOW (ref 8.9–10.3)
GFR calc non Af Amer: >60 mL/min (ref >60)
Glucose, Bld: 104 mg/dL — ABNORMAL HIGH (ref 65–99)
POTASSIUM: 4.3 mmol/L (ref 3.5–5.1)
SODIUM: 135 mmol/L (ref 135–145)

## 2018-05-01 LAB — CBC
HCT: 33.1 % — ABNORMAL LOW (ref 36.0–46.0)
HEMOGLOBIN: 10.8 g/dL — AB (ref 12.0–15.0)
MCH: 30.8 pg (ref 26.0–34.0)
MCHC: 32.6 g/dL (ref 30.0–36.0)
MCV: 94.3 fL (ref 78.0–100.0)
PLATELETS: 181 10*3/uL (ref 150–400)
RBC: 3.51 MIL/uL — AB (ref 3.87–5.11)
RDW: 12.4 % (ref 11.5–15.5)
WBC: 10.8 10*3/uL — AB (ref 4.0–10.5)

## 2018-05-01 LAB — HIV ANTIBODY (ROUTINE TESTING W REFLEX): HIV SCREEN 4TH GENERATION: NONREACTIVE

## 2018-05-01 MED ORDER — PROMETHAZINE HCL 25 MG PO TABS
12.5000 mg | ORAL_TABLET | Freq: Three times a day (TID) | ORAL | Status: DC | PRN
  Administered 2018-05-01: 12.5 mg via ORAL
  Filled 2018-05-01: qty 1

## 2018-05-01 MED ORDER — PROMETHAZINE HCL 12.5 MG RE SUPP
12.5000 mg | Freq: Four times a day (QID) | RECTAL | Status: DC | PRN
  Filled 2018-05-01: qty 1

## 2018-05-01 MED ORDER — PROMETHAZINE HCL 25 MG/ML IJ SOLN
6.2500 mg | Freq: Four times a day (QID) | INTRAMUSCULAR | Status: DC | PRN
  Filled 2018-05-01: qty 1

## 2018-05-01 NOTE — Progress Notes (Signed)
Subjective: Patient post op day 1 and doing better than last night.  Complains of pain.  The dilaudid is helpful but she still has not gotten up and walked.  Objective: Vital signs in last 24 hours: Temp:  [97.8 F (36.6 C)-98.5 F (36.9 C)] 97.8 F (36.6 C) (05/29 1332) Pulse Rate:  [75-100] 78 (05/29 1332) Resp:  [14-18] 18 (05/29 1332) BP: (94-118)/(66-84) 105/72 (05/29 1332) SpO2:  [98 %-100 %] 100 % (05/29 1332) Weight change:  Last BM Date: 04/29/18  Intake/Output from previous day: 05/28 0701 - 05/29 0700 In: 9233 [P.O.:250; I.V.:1300; IV Piggyback:100] Out: 1030 [Urine:710; Drains:255; Blood:65] Intake/Output this shift: Total I/O In: 120 [P.O.:120] Out: 30 [Drains:30]  General appearance: alert, cooperative and no distress Incision/Wound:  Lab Results: Recent Labs    05/01/18 0610  WBC 10.8*  HGB 10.8*  HCT 33.1*  PLT 181   BMET Recent Labs    05/01/18 0610  NA 135  K 4.3  CL 102  CO2 24  GLUCOSE 104*  BUN 9  CREATININE 0.86  CALCIUM 8.8*    Studies/Results: Nm Sentinel Node Inj-no Rpt (breast)  Result Date: 04/30/2018 Sulfur colloid was injected by the nuclear medicine technologist for melanoma sentinel node.    Medications: I have reviewed the patient's current medications.  Assessment/Plan: Encourage walking and movement.  Plan for discharge tomorrow.  Continue pain management.  LOS: 0 days    Wallace Going 05/01/2018

## 2018-05-02 ENCOUNTER — Encounter (HOSPITAL_COMMUNITY): Payer: Self-pay | Admitting: Surgery

## 2018-05-02 DIAGNOSIS — D0511 Intraductal carcinoma in situ of right breast: Secondary | ICD-10-CM | POA: Diagnosis not present

## 2018-05-02 DIAGNOSIS — F419 Anxiety disorder, unspecified: Secondary | ICD-10-CM | POA: Diagnosis not present

## 2018-05-02 DIAGNOSIS — D225 Melanocytic nevi of trunk: Secondary | ICD-10-CM | POA: Diagnosis not present

## 2018-05-02 DIAGNOSIS — K219 Gastro-esophageal reflux disease without esophagitis: Secondary | ICD-10-CM | POA: Diagnosis not present

## 2018-05-02 DIAGNOSIS — Z87891 Personal history of nicotine dependence: Secondary | ICD-10-CM | POA: Diagnosis not present

## 2018-05-02 DIAGNOSIS — N6022 Fibroadenosis of left breast: Secondary | ICD-10-CM | POA: Diagnosis not present

## 2018-05-02 DIAGNOSIS — Z79899 Other long term (current) drug therapy: Secondary | ICD-10-CM | POA: Diagnosis not present

## 2018-05-02 NOTE — Progress Notes (Signed)
Discharged home with fiance. Personal belongings,discharged instructions given to patient. Verbalized understanding of instructions. No further questions asked

## 2018-05-02 NOTE — Discharge Summary (Signed)
Physician Discharge Summary  Patient ID: Ashley [name] MRN: [id number] DOB/AGE: Nov 10, 1976 42 y.o.  Admit date: 04/30/2018 Discharge date: 05/02/2018  Admission Diagnoses:  Discharge Diagnoses:  Active Problems:   Breast cancer [facility])   Discharged Condition: good  Hospital Course: The patient was admitted and taken to the OR for bilateral mastectomies with reconstruction.  She was reconstructed with expanders and FlexHD.  She did well with tolerating food.  She struggled some for pain relief but did well once she accepted the dilaudid.  She was ready for home.  Drains in place.  Incisions closed.  No sign of infection.  Breathing well and walking.  Consults: none  Significant Diagnostic Studies: none  Treatments: surgery  Discharge Exam: Blood pressure 110/83, pulse 78, temperature 98.2 F (36.8 C), temperature source Oral, resp. rate 18, height 5\' 5"  (1.651 m), weight 66.4 kg (146 lb 6.2 oz), SpO2 99 %. General appearance: alert, cooperative and no distress Breasts: normal appearance, no masses or tenderness Incision/Wound:  Disposition: Discharge disposition: 01-Home or Self Care       Discharge Instructions    Call MD for:  difficulty breathing, headache or visual disturbances   Complete by:  As directed    Call MD for:  hives   Complete by:  As directed    Call MD for:  persistant nausea and vomiting   Complete by:  As directed    Call MD for:  redness, tenderness, or signs of infection (pain, swelling, redness, odor or green/yellow discharge around incision site)   Complete by:  As directed    Call MD for:  severe uncontrolled pain   Complete by:  As directed    Call MD for:  temperature >100.4   Complete by:  As directed    Diet general   Complete by:  As directed    Discharge wound care:   Complete by:  As directed    Drain care.   Driving Restrictions   Complete by:  As directed    No driving while on pain medication. Recommend waiting a week after  surgery.   Increase activity slowly   Complete by:  As directed    Lifting restrictions   Complete by:  As directed    No heavy lifting.      Follow-up Information    Arval [name], Ashley Lofty, DO In 1 week.   Specialty:  Plastic Surgery Contact information: [place] [address] [phone]        Ashley Keens, MD In 2 weeks.   Specialty:  General Surgery Contact information: [place] [address] [phone]           Signed: Wallace Going 05/02/2018, 9:29 AM

## 2018-05-02 NOTE — Discharge Instructions (Signed)
No heavy lifting. Sink bath. Continue binder or sports bra.

## 2018-05-02 NOTE — Anesthesia Postprocedure Evaluation (Signed)
Anesthesia Post Note  Patient: [name]  Procedure(s) Performed: BILATERAL MASTECTOMIES WITH RIGHT SENTINEL LYMPH NODE BIOPSY (Bilateral Breast) BREAST RECONSTRUCTION WITH PLACEMENT OF TISSUE EXPANDER AND FLEX HD (ACELLULAR HYDRATED DERMIS) (Bilateral Breast)     Patient location during evaluation: PACU Anesthesia Type: Regional and General Level of consciousness: awake and alert Pain management: pain level controlled Vital Signs Assessment: post-procedure vital signs reviewed and stable Respiratory status: spontaneous breathing, nonlabored ventilation, respiratory function stable and patient connected to nasal cannula oxygen Cardiovascular status: blood pressure returned to baseline and stable Postop Assessment: no apparent nausea or vomiting Anesthetic complications: no    Last Vitals:  Vitals:   05/02/18 0453 05/02/18 1357  BP: 110/83 121/79  Pulse: 78 85  Resp: 18 18  Temp: 36.8 C 36.6 C  SpO2: 99% 100%    Last Pain:  Vitals:   05/02/18 1357  TempSrc: Oral  PainSc:                  Ashley [name]

## 2018-05-10 ENCOUNTER — Encounter: Payer: Self-pay | Admitting: General Practice

## 2018-05-10 ENCOUNTER — Telehealth: Payer: Self-pay | Admitting: *Deleted

## 2018-05-10 NOTE — Progress Notes (Signed)
[place] CSW Progress Notes  CSW notified by chaplain Wynelle Fanny that nurse navigator had requested patient be called by chaplain and/or social work.  Responding chaplain was Lanice Schwab on 05/08/18.  CSW reached patient today - brief conversation in which patient expressed discomfort w being contact by a female "because he doesn't understand."  Phone either cut off or was hung up.  CSW called back, left VM encouraging patient to call back to continue conversation.  Unable to provide significant assistance due to inability to connect w patient.    Edwyna Shell, LCSW Clinical Social Worker Phone:  [phone]

## 2018-05-10 NOTE — Telephone Encounter (Signed)
Spoke to Ashley [name] concerning emla cream to "port site" for fillings. Gave directions and instructions. Ashley [name] discussed feelings of anxiety when undressing and taking a shower d/t "drains and expanders feeling like rocks". Validated her feels and discussed ways to help decrease anxiety. Denies further needs at this time. Encourage to call with needs. Received verbal understanding. Contact information provided.

## 2018-05-13 NOTE — Progress Notes (Signed)
Patient Care Team: Susy Frizzle, MD as PCP - General (Family Medicine)  DIAGNOSIS:  Encounter Diagnosis  Name Primary?  . Ductal carcinoma in situ (DCIS) of right breast     SUMMARY OF ONCOLOGIC HISTORY:   Ductal carcinoma in situ (DCIS) of right breast   01/29/2018 Surgery    Reexcision of the margins: New superior margin high-grade DCIS largest focus 0.2 cm new right inferior margin: High-grade DCIS with necrosis largest focus 1.5 cm, 0.1 cm from the new inferior lateral margin      01/29/2018 Initial Diagnosis    Right needle biopsy 630 position: DCIS intermediate grade involving a papilloma, negative for invasive cancer, ER 90%, PR 70%, Tis N0 stage 0      02/18/2018 Surgery    Right lumpectomy: High-grade DCIS with necrosis, superior margin broadly positive and less than 0.1 cm to the inferior margin and posterolateral margins, intraductal papilloma involved by high-grade DCIS, inferior posterolateral margin high-grade DCIS less than 0.1 cm multifocally, ER 90%, PR 70%, lymph nodes negative, Tis and 0 stage I      03/21/2018 Genetic Testing    Negative genetic testing on the STAT and common hereditary cancer panel.  The Hereditary Gene Panel offered by [facility] includes sequencing and/or deletion duplication testing of the following 47 genes: APC, ATM, AXIN2, BARD1, BMPR1A, BRCA1, BRCA2, BRIP1, CDH1, CDK4, CDKN2A (p14ARF), CDKN2A (p16INK4a), CHEK2, CTNNA1, DICER1, EPCAM (Deletion/duplication testing only), GREM1 (promoter region deletion/duplication testing only), KIT, MEN1, MLH1, MSH2, MSH3, MSH6, MUTYH, NBN, NF1, NHTL1, PALB2, PDGFRA, PMS2, POLD1, POLE, PTEN, RAD50, RAD51C, RAD51D, SDHB, SDHC, SDHD, SMAD4, SMARCA4. STK11, TP53, TSC1, TSC2, and VHL.  The following genes were evaluated for sequence changes only: SDHA and HOXB13 c.251G>A variant only. The report date is March 21, 2018.       04/30/2018 Surgery    Bilateral mastectomies: Right mastectomy: Residual DCIS high-grade  0.1 cm, margins negative, 0/4 lymph nodes negative, left mastectomy: Skin with melanocytic nevus intradermal type       CHIEF COMPLIANT: Follow-up after recent right mastectomy for positive margins for DCIS  INTERVAL HISTORY: Ashley [name] is a 42 year old with above-mentioned history of DCIS right breast with underwent lumpectomy and had positive margin and she underwent mastectomy.  This is by her preference.  She has tolerated surgery reasonably well.  She is undergoing breast reconstruction.  REVIEW OF SYSTEMS:   Constitutional: Denies fevers, chills or abnormal weight loss Eyes: Denies blurriness of vision Ears, nose, mouth, throat, and face: Denies mucositis or sore throat Respiratory: Denies cough, dyspnea or wheezes Cardiovascular: Denies palpitation, chest discomfort Gastrointestinal:  Denies nausea, heartburn or change in bowel habits Skin: Denies abnormal skin rashes Lymphatics: Denies new lymphadenopathy or easy bruising Neurological:Denies numbness, tingling or new weaknesses Behavioral/Psych: Mood is stable, no new changes  Extremities: No lower extremity edema Breast: Right mastectomy All other systems were reviewed with the patient and are negative.  I have reviewed the past medical history, past surgical history, social history and family history with the patient and they are unchanged from previous note.  ALLERGIES:  is allergic to prednisone; doxycycline; aspirin; biaxin [clarithromycin]; ciprofloxacin; diphenhydramine hcl; latex; levaquin [levofloxacin in d5w]; penicillins; and vicodin [hydrocodone-acetaminophen].  MEDICATIONS:  Current Outpatient Medications  Medication Sig Dispense Refill  . ibuprofen (ADVIL,MOTRIN) 800 MG tablet Take 1 tablet (800 mg total) by mouth every 8 (eight) hours as needed for moderate pain. 21 tablet 0  . Levonorgestrel (KYLEENA) 19.5 MG IUD by Intrauterine route once.    Marland Kitchen  loratadine (CLARITIN) 10 MG tablet Take 10 mg by mouth daily  as needed for allergies.    Marland Kitchen omeprazole (PRILOSEC) 40 MG capsule TAKE 1 CAPSULE BY MOUTH ONCE DAILY (Patient taking differently: Take 40 mgs by mouth once daily at night) 90 capsule 1  . promethazine (PHENERGAN) 25 MG tablet Take 1 tablet (25 mg total) by mouth every 6 (six) hours as needed for nausea or vomiting. 20 tablet 0  . propranolol (INDERAL) 10 MG tablet Take 1 tablet (10 mg total) at bedtime by mouth. 30 tablet 0  . ranitidine (ZANTAC) 150 MG tablet Take 150 mg by mouth daily as needed for heartburn.    . rizatriptan (MAXALT) 5 MG tablet Take 1 tablet (5 mg total) by mouth as needed for migraine. May repeat in 2 hours if needed (Patient taking differently: Take 10 mg by mouth every 2 (two) hours as needed for migraine. May repeat in 2 hours if needed) 10 tablet 0  . sodium chloride (OCEAN) 0.65 % SOLN nasal spray Place 1 spray into both nostrils as needed for congestion.    . sulfamethoxazole-trimethoprim (BACTRIM DS,SEPTRA DS) 800-160 MG tablet Take 1 tablet by mouth 2 (two) times daily.     No current facility-administered medications for this visit.     PHYSICAL EXAMINATION: ECOG PERFORMANCE STATUS: 1 - Symptomatic but completely ambulatory  Vitals:   05/14/18 1219  BP: 105/73  Pulse: 80  Resp: 17  Temp: 97.9 F (36.6 C)  SpO2: 100%   Filed Weights   05/14/18 1219  Weight: 132 lb 9.6 oz (60.1 kg)    GENERAL:alert, no distress and comfortable SKIN: skin color, texture, turgor are normal, no rashes or significant lesions EYES: normal, Conjunctiva are pink and non-injected, sclera clear OROPHARYNX:no exudate, no erythema and lips, buccal mucosa, and tongue normal  NECK: supple, thyroid normal size, non-tender, without nodularity LYMPH:  no palpable lymphadenopathy in the cervical, axillary or inguinal LUNGS: clear to auscultation and percussion with normal breathing effort HEART: regular rate & rhythm and no murmurs and no lower extremity edema ABDOMEN:abdomen soft,  non-tender and normal bowel sounds MUSCULOSKELETAL:no cyanosis of digits and no clubbing  NEURO: alert & oriented x 3 with fluent speech, no focal motor/sensory deficits EXTREMITIES: No lower extremity edema  LABORATORY DATA:  I have reviewed the data as listed CMP Latest Ref Rng & Units 05/01/2018 04/23/2018 02/13/2018  Glucose 65 - 99 mg/dL 104(H) 82 89  BUN 6 - 20 mg/dL _0 Creatinine 0.44 - 1.00 mg/dL 0.86 0.91 0.73  Sodium 135 - 145 mmol/L 135 137 141  Potassium 3.5 - 5.1 mmol/L 4.3 4.4 3.7  Chloride 101 - 111 mmol/L 102 106 111  CO2 22 - 32 mmol/L 24 21(L) 22  Calcium 8.9 - 10.3 mg/dL 8.8(L) 9.3 9.0  Total Protein 6.1 - 8.1 g/dL - - -  Total Bilirubin 0.2 - 1.2 mg/dL - - -  Alkaline Phos 33 - 115 U/L - - -  AST 10 - 30 U/L - - -  ALT 6 - 29 U/L - - -    Lab Results  Component Value Date   WBC 10.8 (H) 05/01/2018   HGB 10.8 (L) 05/01/2018   HCT 33.1 (L) 05/01/2018   MCV 94.3 05/01/2018   PLT 181 05/01/2018   NEUTROABS 4.5 11/29/2017    ASSESSMENT & PLAN:  Ductal carcinoma in situ (DCIS) of right breast 01/29/2018: Screening detected right breast mass retroareolar at 630 position 0.8 cm, status post  lumpectomy and reexcision 0.8 cm high-grade DCIS with necrosis and intraductal papilloma ER 90%, PR 70%, Tis NX stage 0  04/22/2018: Bilateral mastectomies: Right mastectomy: Residual DCIS high-grade 0.1 cm, margins negative, 0/4 lymph nodes negative, left mastectomy: Skin with melanocytic nevus intradermal type  Recommendation: No role of radiation or antiestrogen therapy because she had bilateral mastectomies for DCIS.  Return to clinic on an as-needed basis.  She will have surveillance checkups with her surgeon.    No orders of the defined types were placed in this encounter.  The patient has a good understanding of the overall plan. she agrees with it. she will call with any problems that may develop before the next visit here.   Harriette Ohara, MD 05/14/18

## 2018-05-13 NOTE — Assessment & Plan Note (Signed)
01/29/2018: Screening detected right breast mass retroareolar at 630 position 0.8 cm, status post lumpectomy and reexcision 0.8 cm high-grade DCIS with necrosis and intraductal papilloma ER 90%, PR 70%, Tis NX stage 0  04/22/2018: Right mastectomy: Residual DCIS high-grade 0.1 cm, margins negative, 0/4 lymph nodes negative, skin with melanocytic nevus intradermal type  Recommendation: Adjuvant antiestrogen therapy with tamoxifen 20 mg daily x5 years  Return to clinic in 3 months for toxicity check and follow-up

## 2018-05-14 ENCOUNTER — Encounter: Payer: Self-pay | Admitting: *Deleted

## 2018-05-14 ENCOUNTER — Inpatient Hospital Stay: Payer: BLUE CROSS/BLUE SHIELD | Attending: Hematology and Oncology | Admitting: Hematology and Oncology

## 2018-05-14 DIAGNOSIS — D0511 Intraductal carcinoma in situ of right breast: Secondary | ICD-10-CM | POA: Diagnosis not present

## 2018-05-14 DIAGNOSIS — Z9013 Acquired absence of bilateral breasts and nipples: Secondary | ICD-10-CM

## 2018-05-14 DIAGNOSIS — Z170 Estrogen receptor positive status [ER+]: Secondary | ICD-10-CM | POA: Diagnosis not present

## 2018-05-25 DIAGNOSIS — L309 Dermatitis, unspecified: Secondary | ICD-10-CM | POA: Diagnosis not present

## 2018-05-25 DIAGNOSIS — C50919 Malignant neoplasm of unspecified site of unspecified female breast: Secondary | ICD-10-CM | POA: Diagnosis not present

## 2018-05-30 ENCOUNTER — Other Ambulatory Visit: Payer: Self-pay

## 2018-05-30 ENCOUNTER — Ambulatory Visit: Payer: BLUE CROSS/BLUE SHIELD | Attending: Plastic Surgery | Admitting: Physical Therapy

## 2018-05-30 DIAGNOSIS — M25611 Stiffness of right shoulder, not elsewhere classified: Secondary | ICD-10-CM | POA: Diagnosis not present

## 2018-05-30 DIAGNOSIS — Z483 Aftercare following surgery for neoplasm: Secondary | ICD-10-CM | POA: Diagnosis not present

## 2018-05-30 DIAGNOSIS — M25612 Stiffness of left shoulder, not elsewhere classified: Secondary | ICD-10-CM | POA: Insufficient documentation

## 2018-05-30 NOTE — Patient Instructions (Signed)
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
## 2018-06-10 ENCOUNTER — Telehealth (HOSPITAL_COMMUNITY): Payer: Self-pay | Admitting: Family Medicine

## 2018-06-10 NOTE — Telephone Encounter (Signed)
Patient called with questions about rehab she had therapy in [place] want to come to [facility] for awhile and then go back to [place] once she return to work I was not sure so Eustaquio Maize took the call. WT

## 2018-06-12 ENCOUNTER — Ambulatory Visit (HOSPITAL_COMMUNITY): Payer: BLUE CROSS/BLUE SHIELD | Attending: Plastic Surgery

## 2018-06-12 DIAGNOSIS — Z483 Aftercare following surgery for neoplasm: Secondary | ICD-10-CM

## 2018-06-12 DIAGNOSIS — M25611 Stiffness of right shoulder, not elsewhere classified: Secondary | ICD-10-CM | POA: Diagnosis not present

## 2018-06-12 DIAGNOSIS — M25612 Stiffness of left shoulder, not elsewhere classified: Secondary | ICD-10-CM | POA: Diagnosis not present
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
## 2018-06-13 ENCOUNTER — Ambulatory Visit (HOSPITAL_COMMUNITY): Payer: BLUE CROSS/BLUE SHIELD | Admitting: Physical Therapy

## 2018-06-13 ENCOUNTER — Encounter (HOSPITAL_COMMUNITY): Payer: Self-pay | Admitting: Physical Therapy

## 2018-06-13 DIAGNOSIS — Z483 Aftercare following surgery for neoplasm: Secondary | ICD-10-CM | POA: Diagnosis not present

## 2018-06-13 DIAGNOSIS — M25611 Stiffness of right shoulder, not elsewhere classified: Secondary | ICD-10-CM

## 2018-06-13 DIAGNOSIS — M25612 Stiffness of left shoulder, not elsewhere classified: Secondary | ICD-10-CM
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
## 2018-06-14 ENCOUNTER — Encounter

## 2018-06-14 ENCOUNTER — Encounter: Payer: BLUE CROSS/BLUE SHIELD | Admitting: Rehabilitation

## 2018-06-18 ENCOUNTER — Telehealth (HOSPITAL_COMMUNITY): Payer: Self-pay | Admitting: Physical Therapy

## 2018-06-18 NOTE — Telephone Encounter (Signed)
Called pt offered 2 morning apptments and 2 afternoon apptments to get her in earlier if she wants them, per AF request. NF 06/18/18

## 2018-06-19 ENCOUNTER — Ambulatory Visit (HOSPITAL_COMMUNITY): Payer: BLUE CROSS/BLUE SHIELD | Admitting: Physical Therapy

## 2018-06-19 DIAGNOSIS — M25611 Stiffness of right shoulder, not elsewhere classified: Secondary | ICD-10-CM

## 2018-06-19 DIAGNOSIS — M25612 Stiffness of left shoulder, not elsewhere classified: Secondary | ICD-10-CM | POA: Diagnosis not present

## 2018-06-19 DIAGNOSIS — Z483 Aftercare following surgery for neoplasm: Secondary | ICD-10-CM
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
## 2018-06-20 ENCOUNTER — Ambulatory Visit (HOSPITAL_COMMUNITY): Payer: BLUE CROSS/BLUE SHIELD | Admitting: Physical Therapy

## 2018-06-20 DIAGNOSIS — Z483 Aftercare following surgery for neoplasm: Secondary | ICD-10-CM

## 2018-06-20 DIAGNOSIS — M25612 Stiffness of left shoulder, not elsewhere classified: Secondary | ICD-10-CM

## 2018-06-20 DIAGNOSIS — M25611 Stiffness of right shoulder, not elsewhere classified: Secondary | ICD-10-CM | POA: Diagnosis not present
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
## 2018-06-26 ENCOUNTER — Encounter (HOSPITAL_COMMUNITY): Payer: Self-pay

## 2018-06-26 ENCOUNTER — Ambulatory Visit (HOSPITAL_COMMUNITY): Payer: BLUE CROSS/BLUE SHIELD

## 2018-06-26 DIAGNOSIS — M25611 Stiffness of right shoulder, not elsewhere classified: Secondary | ICD-10-CM | POA: Diagnosis not present

## 2018-06-26 DIAGNOSIS — Z483 Aftercare following surgery for neoplasm: Secondary | ICD-10-CM | POA: Diagnosis not present

## 2018-06-26 DIAGNOSIS — M25612 Stiffness of left shoulder, not elsewhere classified: Secondary | ICD-10-CM
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
## 2018-06-27 ENCOUNTER — Encounter (HOSPITAL_COMMUNITY): Payer: Self-pay

## 2018-06-27 ENCOUNTER — Ambulatory Visit (HOSPITAL_COMMUNITY): Payer: BLUE CROSS/BLUE SHIELD

## 2018-06-27 DIAGNOSIS — M25612 Stiffness of left shoulder, not elsewhere classified: Secondary | ICD-10-CM

## 2018-06-27 DIAGNOSIS — M25611 Stiffness of right shoulder, not elsewhere classified: Secondary | ICD-10-CM | POA: Diagnosis not present

## 2018-06-27 DIAGNOSIS — Z483 Aftercare following surgery for neoplasm: Secondary | ICD-10-CM | POA: Diagnosis not present
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
## 2018-07-02 ENCOUNTER — Encounter: Payer: Self-pay | Admitting: Family Medicine

## 2018-07-02 ENCOUNTER — Encounter (HOSPITAL_COMMUNITY): Payer: Self-pay

## 2018-07-02 ENCOUNTER — Telehealth (HOSPITAL_COMMUNITY): Payer: Self-pay

## 2018-07-02 NOTE — Telephone Encounter (Signed)
Called to discuss changing apt times to be with PT for reassessment.  Pt stated she works til 5:00 every day and unable to come in earlier.  [address] [place]; [facility] [phone]

## 2018-07-02 NOTE — Progress Notes (Signed)
Pt pages after hours On-call provider, is concerned with abnormal feeling of "extra skin" on prolapsed hemorrhoid.  Hemorrhoids are not new, there is no pain or bleeding.  It just "feels different". Advised pt to come into office to have it checked.  Told to call in the am to make appointment.   I advised that without pain or bleeding, pt could go to urgent care tonight or wait until tomorrow to be seen.   She wants to know  what to tell her employer and I explained that a work note could be provided when she is seen.  She states they would say, "if it was so bad why didn't you go to the emergency room?"  And I explained that those personal and medical decisions are none of the employers business.  If she cannot miss work to be seen in clinic, she was urged to go to UC with extended hours to avoid the conflict.  She was not going to UC for eval despite her  concerns.   I checked the available clinic schedule and no providers have availability until after 10 am.   She will have to call in the am to get scheduled.  Again urged to go to UC eval tonight if she is concerned or schedule cannot accommodate .  Sounds like it should be accessed in the next few days.

## 2018-07-03 ENCOUNTER — Ambulatory Visit (HOSPITAL_COMMUNITY): Payer: BLUE CROSS/BLUE SHIELD

## 2018-07-03 ENCOUNTER — Encounter (HOSPITAL_COMMUNITY): Payer: BLUE CROSS/BLUE SHIELD

## 2018-07-03 ENCOUNTER — Encounter (HOSPITAL_COMMUNITY): Payer: Self-pay

## 2018-07-03 VITALS — BP 144/94 | HR 80

## 2018-07-03 DIAGNOSIS — Z483 Aftercare following surgery for neoplasm: Secondary | ICD-10-CM | POA: Diagnosis not present

## 2018-07-03 DIAGNOSIS — M25612 Stiffness of left shoulder, not elsewhere classified: Secondary | ICD-10-CM

## 2018-07-03 DIAGNOSIS — M25611 Stiffness of right shoulder, not elsewhere classified: Secondary | ICD-10-CM

## 2018-07-03 DIAGNOSIS — K649 Unspecified hemorrhoids: Secondary | ICD-10-CM | POA: Diagnosis not present

## 2018-07-03 NOTE — Patient Instructions (Signed)
Wall Slide stretch    Slide your arms up the wall and hold for 10" getting good stretch on chest  Do 5-10 times, 1-2 times per day.  http://ss.exer.us/225   Copyright  VHI. All rights reserved.
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
## 2018-07-04 ENCOUNTER — Encounter (HOSPITAL_COMMUNITY): Payer: BLUE CROSS/BLUE SHIELD

## 2018-07-04 ENCOUNTER — Ambulatory Visit (HOSPITAL_COMMUNITY): Payer: BLUE CROSS/BLUE SHIELD

## 2018-07-05 ENCOUNTER — Encounter

## 2018-07-10 ENCOUNTER — Encounter (HOSPITAL_COMMUNITY): Payer: BLUE CROSS/BLUE SHIELD

## 2018-07-12 ENCOUNTER — Ambulatory Visit (HOSPITAL_COMMUNITY): Payer: BLUE CROSS/BLUE SHIELD | Attending: Plastic Surgery

## 2018-07-12 ENCOUNTER — Encounter (HOSPITAL_COMMUNITY): Payer: Self-pay

## 2018-07-12 ENCOUNTER — Other Ambulatory Visit: Payer: Self-pay

## 2018-07-12 DIAGNOSIS — Z483 Aftercare following surgery for neoplasm: Secondary | ICD-10-CM | POA: Diagnosis not present

## 2018-07-12 DIAGNOSIS — M25612 Stiffness of left shoulder, not elsewhere classified: Secondary | ICD-10-CM | POA: Insufficient documentation

## 2018-07-12 DIAGNOSIS — M25611 Stiffness of right shoulder, not elsewhere classified: Secondary | ICD-10-CM | POA: Diagnosis not present
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
## 2018-07-15 ENCOUNTER — Encounter: Payer: Self-pay | Admitting: Family Medicine

## 2018-07-15 ENCOUNTER — Encounter: Payer: Self-pay | Admitting: Physician Assistant

## 2018-07-15 ENCOUNTER — Ambulatory Visit: Payer: BLUE CROSS/BLUE SHIELD | Admitting: Physician Assistant

## 2018-07-15 VITALS — BP 132/88 | HR 76 | Temp 97.7°F | Resp 16 | Ht 65.0 in | Wt 132.6 lb

## 2018-07-15 DIAGNOSIS — J069 Acute upper respiratory infection, unspecified: Secondary | ICD-10-CM

## 2018-07-15 MED ORDER — AZITHROMYCIN 250 MG PO TABS: ORAL_TABLET | ORAL | 0 refills | Status: DC

## 2018-07-15 NOTE — Progress Notes (Signed)
Patient ID: Ashley [name] MRN: [id number], DOB: 07-06-1976, 42 y.o. Date of Encounter: 07/15/2018, 12:26 PM    Chief Complaint: Coughing out phlegm secondary to drainage down her throat.  Chief Complaint  Patient presents with  . Headache  . right ear pain  . Emesis     HPI: 42 y.o. year old female presents with above.   Prior to entering exam room, reviewed chart--reviewed that she had breast surgery. Noted that she has had breast surgery. She reports that because of her current illness-- that if she coughs etc.--- it causes increased pain in her chest related to her breast surgery. She also reports that she has had sinus infections in the past and that her current symptoms are the same as symptoms she has had with sinus infections in the past. Says that she is having a lot of thick dark mucus from her nose and spitting out thick dark phlegm secondary to drainage down her throat. States that in the past Z-Pak has worked well when she has had similar symptoms.  Says that anytime she has been prescribed any other medication they do not work and she has to then come here and get Z-Pak.  States that her right ear hurts and that this always happens with sinus infection. Needs a note for work.  Says that she has missed so much work lately.  Will plan to return to work tomorrow but needs note to cover for today.     Home Meds:   Outpatient Medications Prior to Visit  Medication Sig Dispense Refill  . diazepam (VALIUM) 2 MG tablet Take 2 mg by mouth every 6 (six) hours as needed for anxiety.    Marland Kitchen ibuprofen (ADVIL,MOTRIN) 800 MG tablet Take 1 tablet (800 mg total) by mouth every 8 (eight) hours as needed for moderate pain. 21 tablet 0  . Levonorgestrel (KYLEENA) 19.5 MG IUD by Intrauterine route once.    Marland Kitchen omeprazole (PRILOSEC) 40 MG capsule TAKE 1 CAPSULE BY MOUTH ONCE DAILY (Patient taking differently: Take 40 mgs by mouth once daily at night) 90 capsule 1  . promethazine (PHENERGAN)  25 MG tablet Take 1 tablet (25 mg total) by mouth every 6 (six) hours as needed for nausea or vomiting. 20 tablet 0  . propranolol (INDERAL) 10 MG tablet Take 1 tablet (10 mg total) at bedtime by mouth. 30 tablet 0  . ranitidine (ZANTAC) 150 MG tablet Take 150 mg by mouth daily as needed for heartburn.    . rizatriptan (MAXALT) 5 MG tablet Take 1 tablet (5 mg total) by mouth as needed for migraine. May repeat in 2 hours if needed (Patient taking differently: Take 10 mg by mouth every 2 (two) hours as needed for migraine. May repeat in 2 hours if needed) 10 tablet 0  . sodium chloride (OCEAN) 0.65 % SOLN nasal spray Place 1 spray into both nostrils as needed for congestion.    . traMADol (ULTRAM) 50 MG tablet Take by mouth every 6 (six) hours as needed.    . triamcinolone acetonide 40 MG/ML SUSP 40 mg, mupirocin cream 2 % CREA 15 g Apply 1 application topically.    Marland Kitchen loratadine (CLARITIN) 10 MG tablet Take 10 mg by mouth daily as needed for allergies.    Marland Kitchen sulfamethoxazole-trimethoprim (BACTRIM DS,SEPTRA DS) 800-160 MG tablet Take 1 tablet by mouth 2 (two) times daily.     No facility-administered medications prior to visit.     Allergies:  Allergies  Allergen Reactions  .  Prednisone Palpitations  . Doxycycline Nausea And Vomiting  . Aspirin Hives  . Biaxin [Clarithromycin] Other (See Comments)    Induced migraine  . Ciprofloxacin Rash  . Diphenhydramine Hcl Palpitations and Other (See Comments)    *doesn't like the way the injection makes her feel*  . Latex Itching and Rash  . Levaquin [Levofloxacin In D5w] Hives  . Penicillins Hives and Other (See Comments)    Has patient had a PCN reaction causing immediate rash, facial/tongue/throat swelling, SOB or lightheadedness with hypotension: No Has patient had a PCN reaction causing severe rash involving mucus membranes or skin necrosis: No Has patient had a PCN reaction that required hospitalization: No Has patient had a PCN reaction  occurring within the last 10 years: No If all of the above answers are "NO", then may proceed with Cephalosporin use.   . Vicodin [Hydrocodone-Acetaminophen] Nausea And Vomiting      Review of Systems: See HPI for pertinent ROS. All other ROS negative.    Physical Exam: Blood pressure 132/88, pulse 76, temperature 97.7 F (36.5 C), temperature source Oral, resp. rate 16, height 5\' 5"  (1.651 m), weight 60.1 kg, SpO2 99 %., Body mass index is 22.07 kg/m. General:  WF. Appears in no acute distress. HEENT: Normocephalic, atraumatic, eyes without discharge, sclera non-icteric, nares are without discharge. Bilateral auditory canals clear, TM's are without perforation, pearly grey and translucent with reflective cone of light bilaterally. Oral cavity moist, posterior pharynx without exudate, erythema, peritonsillar abscess.  Neck: Supple. No thyromegaly. No lymphadenopathy. Lungs: Clear bilaterally to auscultation without wheezes, rales, or rhonchi. Breathing is unlabored. Heart: Regular rhythm. No murmurs, rubs, or gallops. Msk:  Strength and tone normal for age. Extremities/Skin: Warm and dry.  Neuro: Alert and oriented X 3. Moves all extremities spontaneously. Gait is normal. CNII-XII grossly in tact. Psych:  Responds to questions appropriately with a normal affect.     ASSESSMENT AND PLAN:  42 y.o. year old female with   1. Acute upper respiratory infection She is to take the azithromycin as directed.  Follow-up if symptoms worsen or do not resolve within 1 week after completion of antibiotic. Note given to cover out of work today with plans to return to work tomorrow. - azithromycin (ZITHROMAX) 250 MG tablet; Day 1: Take 2 daily.  Days 2 -5: Take 1 daily.  Dispense: 6 tablet; Refill: 0   Signed, [address] [place], [place], [facility] 07/15/2018 12:26 PM

## 2018-07-17 ENCOUNTER — Encounter (HOSPITAL_COMMUNITY): Payer: BLUE CROSS/BLUE SHIELD | Admitting: Physical Therapy

## 2018-07-22 ENCOUNTER — Telehealth: Payer: Self-pay

## 2018-07-22 NOTE — Telephone Encounter (Signed)
Patient was seen in office on 8/12 and has finished the antibiotic that was prescribed and states she still feels sick. Patient was advised to schedule an appointment to be seen, patient states she is not able to get off work. Patient was advised to go to an Urgent care where she can go when getting off work.

## 2018-07-23 DIAGNOSIS — J329 Chronic sinusitis, unspecified: Secondary | ICD-10-CM | POA: Diagnosis not present

## 2018-07-23 DIAGNOSIS — J069 Acute upper respiratory infection, unspecified: Secondary | ICD-10-CM | POA: Diagnosis not present

## 2018-07-23 DIAGNOSIS — J209 Acute bronchitis, unspecified: Secondary | ICD-10-CM | POA: Diagnosis not present

## 2018-07-25 ENCOUNTER — Encounter (HOSPITAL_COMMUNITY): Payer: BLUE CROSS/BLUE SHIELD | Admitting: Physical Therapy

## 2018-07-31 ENCOUNTER — Encounter (HOSPITAL_COMMUNITY): Payer: BLUE CROSS/BLUE SHIELD | Admitting: Physical Therapy

## 2018-08-02 ENCOUNTER — Ambulatory Visit (HOSPITAL_COMMUNITY): Payer: BLUE CROSS/BLUE SHIELD | Admitting: Physical Therapy

## 2018-08-02 ENCOUNTER — Telehealth (HOSPITAL_COMMUNITY): Payer: Self-pay | Admitting: Family Medicine

## 2018-08-02 NOTE — Telephone Encounter (Signed)
08/02/18  pt called to cx but no reason was given

## 2018-08-04 DIAGNOSIS — N760 Acute vaginitis: Secondary | ICD-10-CM | POA: Diagnosis not present

## 2018-08-07 ENCOUNTER — Encounter (HOSPITAL_COMMUNITY): Payer: BLUE CROSS/BLUE SHIELD

## 2018-08-08 ENCOUNTER — Encounter (HOSPITAL_COMMUNITY): Payer: Self-pay

## 2018-08-09 ENCOUNTER — Encounter

## 2018-08-16 ENCOUNTER — Telehealth (HOSPITAL_COMMUNITY): Payer: Self-pay | Admitting: Family Medicine

## 2018-08-16 ENCOUNTER — Telehealth (HOSPITAL_COMMUNITY): Payer: Self-pay | Admitting: Physical Therapy

## 2018-08-16 ENCOUNTER — Encounter (HOSPITAL_COMMUNITY): Payer: Self-pay | Admitting: Physical Therapy

## 2018-08-16 ENCOUNTER — Ambulatory Visit (HOSPITAL_COMMUNITY): Payer: BLUE CROSS/BLUE SHIELD | Admitting: Physical Therapy

## 2018-08-16 NOTE — Telephone Encounter (Signed)
08/16/18  Pt left a message to cx today's appt said that she would be unable to make it today

## 2018-08-16 NOTE — Telephone Encounter (Signed)
Therapist called regarding patient cancelling another appointment this session. Patient has not been to therapy in over a month and therefore discussed that patient would be discharged at this time as patient has been inconsistent with therapy attendance and therapy would not be beneficial due to this. Left a message explaining that patient would be discharged and how to call clinic with any questions or concerns.   Clarene Critchley PT, DPT 2:08 PM, 08/16/18 [phone]

## 2018-08-16 NOTE — Therapy (Signed)
[place], [place], [address] Phone: [phone]   Fax:  [phone]  Patient Details  Name: Ashley [name] MRN: [id number] Date of Birth: 09-09-1976 Referring Provider:  No ref. provider found  Encounter Date: 08/16/2018  PHYSICAL THERAPY DISCHARGE SUMMARY  Visits from Start of Care: 9  Current functional level related to goals / functional outcomes: Unable to fully assess as patient did not return to therapy after last session, however see last assessment on 07/12/18 for more detail. Re-assessment impression on that date included: "Her AROM for shoulder flexion has improved to greater than 140 however she is greatly limited with abduction. While she has reported she feels she improved at least 40% from where she began, her DASH score has not improved."     Remaining deficits: Unable to fully assess as patient did not return to therapy after last session, however see last assessment on 07/12/18 for more detail. Re-assessment impression on that date included: "Her AROM for shoulder flexion has improved to greater than 140 however she is greatly limited with abduction. While she has reported she feels she improved at least 40% from where she began, her DASH score has not improved."       Education / Equipment: Patient was educated on HEP. Educated patient on process for how she could continue therapy after discharge if needed.  Plan: Patient agrees to discharge.  Patient goals were partially met. Patient is being discharged due to not returning since the last visit.  ?????         Patient was inconsistent with therapy attendance and therefore was discharged.   Clarene Critchley PT, DPT 2:53 PM, 08/16/18 [place], [place], [address] Phone: [phone]   Fax:  [phone]

## 2018-08-22 ENCOUNTER — Emergency Department (HOSPITAL_COMMUNITY)
Admission: EM | Admit: 2018-08-22 | Discharge: 2018-08-23 | Disposition: A | Discharge status: Home / Self Care | Payer: BLUE CROSS/BLUE SHIELD | Attending: Emergency Medicine | Admitting: Emergency Medicine

## 2018-08-22 ENCOUNTER — Other Ambulatory Visit: Payer: Self-pay

## 2018-08-22 ENCOUNTER — Emergency Department (HOSPITAL_COMMUNITY): Payer: BLUE CROSS/BLUE SHIELD

## 2018-08-22 ENCOUNTER — Encounter (HOSPITAL_COMMUNITY): Payer: Self-pay | Admitting: Emergency Medicine

## 2018-08-22 DIAGNOSIS — Z79899 Other long term (current) drug therapy: Secondary | ICD-10-CM | POA: Diagnosis not present

## 2018-08-22 DIAGNOSIS — M25562 Pain in left knee: Secondary | ICD-10-CM | POA: Insufficient documentation

## 2018-08-22 DIAGNOSIS — Y92410 Unspecified street and highway as the place of occurrence of the external cause: Secondary | ICD-10-CM | POA: Diagnosis not present

## 2018-08-22 DIAGNOSIS — Z87891 Personal history of nicotine dependence: Secondary | ICD-10-CM | POA: Diagnosis not present

## 2018-08-22 DIAGNOSIS — S199XXA Unspecified injury of neck, initial encounter: Secondary | ICD-10-CM | POA: Diagnosis not present

## 2018-08-22 DIAGNOSIS — S8992XA Unspecified injury of left lower leg, initial encounter: Secondary | ICD-10-CM | POA: Diagnosis not present

## 2018-08-22 DIAGNOSIS — Z91040 Latex allergy status: Secondary | ICD-10-CM | POA: Insufficient documentation

## 2018-08-22 DIAGNOSIS — S161XXA Strain of muscle, fascia and tendon at neck level, initial encounter: Secondary | ICD-10-CM | POA: Insufficient documentation

## 2018-08-22 DIAGNOSIS — Y939 Activity, unspecified: Secondary | ICD-10-CM | POA: Insufficient documentation

## 2018-08-22 DIAGNOSIS — R52 Pain, unspecified: Secondary | ICD-10-CM | POA: Diagnosis not present

## 2018-08-22 DIAGNOSIS — S299XXA Unspecified injury of thorax, initial encounter: Secondary | ICD-10-CM | POA: Diagnosis not present

## 2018-08-22 DIAGNOSIS — Y998 Other external cause status: Secondary | ICD-10-CM | POA: Diagnosis not present

## 2018-08-22 DIAGNOSIS — R079 Chest pain, unspecified: Secondary | ICD-10-CM | POA: Diagnosis not present
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
## 2018-08-23 ENCOUNTER — Encounter (HOSPITAL_COMMUNITY): Payer: BLUE CROSS/BLUE SHIELD | Admitting: Physical Therapy

## 2018-08-23 ENCOUNTER — Emergency Department (HOSPITAL_COMMUNITY): Payer: BLUE CROSS/BLUE SHIELD

## 2018-08-23 DIAGNOSIS — S8992XA Unspecified injury of left lower leg, initial encounter: Secondary | ICD-10-CM | POA: Diagnosis not present

## 2018-08-23 DIAGNOSIS — M25562 Pain in left knee: Secondary | ICD-10-CM | POA: Diagnosis not present

## 2018-08-23 MED ORDER — IBUPROFEN 800 MG PO TABS: 800.0000 mg | ORAL_TABLET | Freq: Three times a day (TID) | ORAL | 0 refills | Status: DC | PRN

## 2018-08-23 MED ORDER — IBUPROFEN 800 MG PO TABS
ORAL_TABLET | ORAL | Status: AC
  Filled 2018-08-23: qty 1

## 2018-08-23 MED ORDER — IBUPROFEN 800 MG PO TABS
800.0000 mg | ORAL_TABLET | Freq: Once | ORAL | Status: AC
  Administered 2018-08-23: 800 mg via ORAL

## 2018-08-23 NOTE — Discharge Instructions (Addendum)
1. Medications: Ibuprofen, Valium (home prescription for muscle relaxers as needed), usual home medications 2. Treatment: rest, drink plenty of fluids, gentle stretching as discussed, alternate ice and heat 3. Follow Up: Please followup with your primary doctor in 3 days for discussion of your diagnoses and further evaluation after today's visit; Please follow-up with surgery for any new concerns about tissue expanders;  Return to the ER for worsening pain, difficulty walking, loss of bowel or bladder control or other concerning symptoms

## 2018-08-23 NOTE — ED Notes (Signed)
Patient transported to X-ray 

## 2018-08-28 ENCOUNTER — Other Ambulatory Visit: Payer: Self-pay | Admitting: Family Medicine

## 2018-08-28 DIAGNOSIS — K219 Gastro-esophageal reflux disease without esophagitis: Secondary | ICD-10-CM

## 2018-08-28 MED ORDER — OMEPRAZOLE 40 MG PO CPDR: 40.0000 mg | DELAYED_RELEASE_CAPSULE | Freq: Every day | ORAL | 3 refills | Status: DC

## 2018-08-30 ENCOUNTER — Encounter (INDEPENDENT_AMBULATORY_CARE_PROVIDER_SITE_OTHER): Payer: Self-pay

## 2018-08-30 ENCOUNTER — Ambulatory Visit (INDEPENDENT_AMBULATORY_CARE_PROVIDER_SITE_OTHER): Payer: BLUE CROSS/BLUE SHIELD | Admitting: Family Medicine

## 2018-08-30 ENCOUNTER — Encounter (INDEPENDENT_AMBULATORY_CARE_PROVIDER_SITE_OTHER): Payer: Self-pay | Admitting: Family Medicine

## 2018-08-30 ENCOUNTER — Ambulatory Visit (INDEPENDENT_AMBULATORY_CARE_PROVIDER_SITE_OTHER): Payer: Self-pay

## 2018-08-30 ENCOUNTER — Encounter (HOSPITAL_COMMUNITY): Payer: BLUE CROSS/BLUE SHIELD | Admitting: Physical Therapy

## 2018-08-30 DIAGNOSIS — M1712 Unilateral primary osteoarthritis, left knee: Secondary | ICD-10-CM | POA: Diagnosis not present

## 2018-08-30 DIAGNOSIS — M25572 Pain in left ankle and joints of left foot: Secondary | ICD-10-CM | POA: Diagnosis not present
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
## 2018-09-03 ENCOUNTER — Ambulatory Visit (INDEPENDENT_AMBULATORY_CARE_PROVIDER_SITE_OTHER): Payer: BLUE CROSS/BLUE SHIELD | Admitting: Plastic Surgery

## 2018-09-03 ENCOUNTER — Encounter: Payer: Self-pay | Admitting: Plastic Surgery

## 2018-09-03 VITALS — HR 92 | Resp 12 | Ht 64.0 in | Wt 134.0 lb

## 2018-09-03 DIAGNOSIS — Z9013 Acquired absence of bilateral breasts and nipples: Secondary | ICD-10-CM

## 2018-09-03 DIAGNOSIS — D0511 Intraductal carcinoma in situ of right breast: Secondary | ICD-10-CM
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
## 2018-09-06 ENCOUNTER — Emergency Department (HOSPITAL_COMMUNITY)
Admission: EM | Admit: 2018-09-06 | Discharge: 2018-09-06 | Disposition: A | Discharge status: Home / Self Care | Payer: BLUE CROSS/BLUE SHIELD | Attending: Emergency Medicine | Admitting: Emergency Medicine

## 2018-09-06 ENCOUNTER — Emergency Department (HOSPITAL_COMMUNITY): Payer: BLUE CROSS/BLUE SHIELD

## 2018-09-06 ENCOUNTER — Other Ambulatory Visit: Payer: Self-pay

## 2018-09-06 ENCOUNTER — Encounter (HOSPITAL_COMMUNITY): Payer: Self-pay | Admitting: Emergency Medicine

## 2018-09-06 DIAGNOSIS — M545 Low back pain: Secondary | ICD-10-CM | POA: Insufficient documentation

## 2018-09-06 DIAGNOSIS — M791 Myalgia, unspecified site: Secondary | ICD-10-CM | POA: Diagnosis not present

## 2018-09-06 DIAGNOSIS — T148XXA Other injury of unspecified body region, initial encounter: Secondary | ICD-10-CM

## 2018-09-06 DIAGNOSIS — Z87891 Personal history of nicotine dependence: Secondary | ICD-10-CM | POA: Insufficient documentation

## 2018-09-06 DIAGNOSIS — M542 Cervicalgia: Secondary | ICD-10-CM | POA: Insufficient documentation

## 2018-09-06 DIAGNOSIS — Z91040 Latex allergy status: Secondary | ICD-10-CM | POA: Diagnosis not present

## 2018-09-06 DIAGNOSIS — R079 Chest pain, unspecified: Secondary | ICD-10-CM | POA: Diagnosis not present

## 2018-09-06 DIAGNOSIS — S39012A Strain of muscle, fascia and tendon of lower back, initial encounter: Secondary | ICD-10-CM | POA: Diagnosis not present

## 2018-09-06 DIAGNOSIS — Z79899 Other long term (current) drug therapy: Secondary | ICD-10-CM | POA: Insufficient documentation

## 2018-09-06 DIAGNOSIS — S161XXA Strain of muscle, fascia and tendon at neck level, initial encounter: Secondary | ICD-10-CM | POA: Diagnosis not present

## 2018-09-06 DIAGNOSIS — S299XXA Unspecified injury of thorax, initial encounter: Secondary | ICD-10-CM | POA: Diagnosis not present

## 2018-09-06 DIAGNOSIS — M549 Dorsalgia, unspecified: Secondary | ICD-10-CM | POA: Diagnosis not present

## 2018-09-06 DIAGNOSIS — R0789 Other chest pain: Secondary | ICD-10-CM | POA: Diagnosis not present

## 2018-09-06 MED ORDER — IBUPROFEN 800 MG PO TABS: 800.0000 mg | ORAL_TABLET | Freq: Three times a day (TID) | ORAL | 0 refills | Status: DC | PRN

## 2018-09-06 MED ORDER — IBUPROFEN 800 MG PO TABS
800.0000 mg | ORAL_TABLET | Freq: Once | ORAL | Status: AC
  Administered 2018-09-06: 800 mg via ORAL
  Filled 2018-09-06: qty 1

## 2018-09-06 NOTE — ED Notes (Signed)
Pt verbalized understanding of d/c instructions and has no further questions, VSS, NAD.  

## 2018-09-06 NOTE — ED Notes (Signed)
Placed pt on bedpan, tolerated well. 
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
## 2018-09-06 NOTE — ED Notes (Signed)
Patient transported to X-ray 

## 2018-09-06 NOTE — Discharge Instructions (Addendum)
Please read instructions below. Apply ice to your areas of pain for 20 minutes at a time. You can take Advil/ibuprofen every 8 hours as needed for pain. You can take your valium as prescribed for anxiety and/or muscle spasms. Be aware this medication can make you drowsy. Schedule an appointment with your primary care provider to follow up on your visit today. Return to the ER for severely worsening headache, vision changes, if new numbness or tingling in your arms or legs, inability to urinate, inability to hold your bowels, or weakness in your extremities.
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
## 2018-09-09 ENCOUNTER — Encounter (HOSPITAL_BASED_OUTPATIENT_CLINIC_OR_DEPARTMENT_OTHER): Payer: Self-pay | Admitting: *Deleted

## 2018-09-09 ENCOUNTER — Other Ambulatory Visit: Payer: Self-pay
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
## 2018-09-11 ENCOUNTER — Other Ambulatory Visit: Payer: Self-pay | Admitting: Plastic Surgery

## 2018-09-11 DIAGNOSIS — Z9013 Acquired absence of bilateral breasts and nipples: Secondary | ICD-10-CM

## 2018-09-13 ENCOUNTER — Encounter: Payer: Self-pay | Admitting: Plastic Surgery

## 2018-09-13 ENCOUNTER — Ambulatory Visit (INDEPENDENT_AMBULATORY_CARE_PROVIDER_SITE_OTHER): Payer: BLUE CROSS/BLUE SHIELD | Admitting: Plastic Surgery

## 2018-09-13 VITALS — BP 110/70 | HR 80 | Resp 14 | Ht 64.0 in | Wt 133.0 lb

## 2018-09-13 DIAGNOSIS — Z9013 Acquired absence of bilateral breasts and nipples: Secondary | ICD-10-CM

## 2018-09-13 DIAGNOSIS — D0511 Intraductal carcinoma in situ of right breast: Secondary | ICD-10-CM

## 2018-09-13 MED ORDER — DIAZEPAM 2 MG PO TABS: 2.0000 mg | ORAL_TABLET | Freq: Four times a day (QID) | ORAL | 0 refills | Status: DC | PRN

## 2018-09-13 MED ORDER — SULFAMETHOXAZOLE-TRIMETHOPRIM 400-80 MG PO TABS: 1.0000 | ORAL_TABLET | Freq: Two times a day (BID) | ORAL | 0 refills | Status: DC

## 2018-09-13 MED ORDER — ONDANSETRON HCL 4 MG PO TABS: 4.0000 mg | ORAL_TABLET | Freq: Three times a day (TID) | ORAL | 0 refills | Status: DC | PRN

## 2018-09-13 MED ORDER — TRAMADOL HCL 50 MG PO TABS: 50.0000 mg | ORAL_TABLET | Freq: Three times a day (TID) | ORAL | 0 refills | Status: DC | PRN

## 2018-09-13 NOTE — Progress Notes (Signed)
[facility]  09/13/2018  1. 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 Audelia Hives has recommended the following procedure:   REMOVAL OF BILATERAL BREAST EXPANDER AND PLACEMENT OF SILICONE IMPLANTS  (which will involve appropriate anesthesia and may include the use of blood products*) to be performed by Dr. Marla Roe and her designated assistants. I have been advised of possible risks and consequences associated with the recommended procedure including but not limited to:   BLEEDING, BLOOD CLOTS, NEED FOR BLOOD TRANSFUSION, INFECTION OF WOUNDS, POSTOPERATIVE PAIN, SWELLING, AND BRUISING, SCARRING, DELAYED WOUND HEALING, BREAST CANCER RECURRENCE, PARTIAL OR COMPLETE FLAP LOSS, NEED FOR FURTHER SURGERY, MEDICAL RISKS OF ANESTHESIA AND SURGERY.  2. I understand that I have the option to do nothing, and the possible risks and consequences may include:  DIFFICULTY FITTING INTO CLOTHING.  3. I understand that, in addition to doing nothing, there are alternatives to the recommended procedure including:  WEARING BREAST PROSTHESIS IN CLOTHING.  I have been advised of possible risks and consequences of these alternatives as they compare to the recommended procedure including:   SKIN IRRITATION, DIFFICULTY FITTING INTO CLOTHING.  4. I have been advised that sometimes during a procedure it is discovered that an additional procedure is needed immediately.  5. I understand the hospital is a teaching institution, and allied health personnel may assist in providing my care. I understand that Healthcare industry representative(s) or similar visitors may be present in the operating room based on the discretion and approval of the physician and hospital, and I give my consent to this.  6. I acknowledge that no guarantees as to outcomes have been made concerning this procedure. I have  been advised that if I desire, my physician will give me a further or more detailed explanation concerning my diagnosis, recommended and alternative procedures, or possible risks and consequences. I am satisfied with the explanation given to me and authorize my physician and others as may be selected by my physician to perform the recommended procedure noted above.  7. I understand and give my permission that anything removed from me during the procedure (1) will be examined and reported according to [facility]; (2) will be disposed of in a manner deemed appropriate by the Hospital; and (3) may be used for scientific, Advertising account executive, research, or education purposes.  8. I understand and give my permission that photographic, video, or audio recordings or images of the procedure outlined above may be made for purposes of medical documentation, research, or education. I understand that I may request cessation of filming or recording at any time, and I may rescind my consent to the use of the images up to a reasonable time before it is to be used.  Witness Date Patient's Signature or Date  Legal Representative  Witness (ONLY If an "X" used for signature)  Physician's Signature Date I have personally explained the above information to the patient or the patient's legal representative. This was fully discussed with the patient and her father.  She agrees to the plan.

## 2018-09-13 NOTE — Addendum Note (Signed)
Addended by: Wallace Going on: 09/13/2018 11:07 AM   Modules accepted: Orders

## 2018-09-13 NOTE — H&P (View-Only) (Signed)
[facility]  09/13/2018  1. 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 Audelia Hives has recommended the following procedure:   REMOVAL OF BILATERAL BREAST EXPANDER AND PLACEMENT OF SILICONE IMPLANTS  (which will involve appropriate anesthesia and may include the use of blood products*) to be performed by Dr. Marla Roe and her designated assistants. I have been advised of possible risks and consequences associated with the recommended procedure including but not limited to:   BLEEDING, BLOOD CLOTS, NEED FOR BLOOD TRANSFUSION, INFECTION OF WOUNDS, POSTOPERATIVE PAIN, SWELLING, AND BRUISING, SCARRING, DELAYED WOUND HEALING, BREAST CANCER RECURRENCE, PARTIAL OR COMPLETE FLAP LOSS, NEED FOR FURTHER SURGERY, MEDICAL RISKS OF ANESTHESIA AND SURGERY.  2. I understand that I have the option to do nothing, and the possible risks and consequences may include:  DIFFICULTY FITTING INTO CLOTHING.  3. I understand that, in addition to doing nothing, there are alternatives to the recommended procedure including:  WEARING BREAST PROSTHESIS IN CLOTHING.  I have been advised of possible risks and consequences of these alternatives as they compare to the recommended procedure including:   SKIN IRRITATION, DIFFICULTY FITTING INTO CLOTHING.  4. I have been advised that sometimes during a procedure it is discovered that an additional procedure is needed immediately.  5. I understand the hospital is a teaching institution, and allied health personnel may assist in providing my care. I understand that Healthcare industry representative(s) or similar visitors may be present in the operating room based on the discretion and approval of the physician and hospital, and I give my consent to this.  6. I acknowledge that no guarantees as to outcomes have been made concerning this procedure. I have  been advised that if I desire, my physician will give me a further or more detailed explanation concerning my diagnosis, recommended and alternative procedures, or possible risks and consequences. I am satisfied with the explanation given to me and authorize my physician and others as may be selected by my physician to perform the recommended procedure noted above.  7. I understand and give my permission that anything removed from me during the procedure (1) will be examined and reported according to [facility]; (2) will be disposed of in a manner deemed appropriate by the Hospital; and (3) may be used for scientific, Advertising account executive, research, or education purposes.  8. I understand and give my permission that photographic, video, or audio recordings or images of the procedure outlined above may be made for purposes of medical documentation, research, or education. I understand that I may request cessation of filming or recording at any time, and I may rescind my consent to the use of the images up to a reasonable time before it is to be used.  Witness Date Patient's Signature or Date  Legal Representative  Witness (ONLY If an "X" used for signature)  Physician's Signature Date I have personally explained the above information to the patient or the patient's legal representative. This was fully discussed with the patient and her father.  She agrees to the plan.
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
## 2018-09-16 ENCOUNTER — Encounter (HOSPITAL_BASED_OUTPATIENT_CLINIC_OR_DEPARTMENT_OTHER): Payer: Self-pay | Admitting: Plastic Surgery

## 2018-09-16 ENCOUNTER — Ambulatory Visit (HOSPITAL_BASED_OUTPATIENT_CLINIC_OR_DEPARTMENT_OTHER)
Admission: RE | Admit: 2018-09-16 | Discharge: 2018-09-16 | Disposition: A | Discharge status: Home / Self Care | Payer: BLUE CROSS/BLUE SHIELD | Source: Ambulatory Visit | Attending: Plastic Surgery | Admitting: Plastic Surgery

## 2018-09-16 ENCOUNTER — Ambulatory Visit (HOSPITAL_BASED_OUTPATIENT_CLINIC_OR_DEPARTMENT_OTHER): Payer: BLUE CROSS/BLUE SHIELD | Admitting: Anesthesiology

## 2018-09-16 ENCOUNTER — Encounter (HOSPITAL_BASED_OUTPATIENT_CLINIC_OR_DEPARTMENT_OTHER)
Admission: RE | Disposition: A | Discharge status: Home / Self Care | Payer: Self-pay | Source: Ambulatory Visit | Attending: Plastic Surgery

## 2018-09-16 ENCOUNTER — Other Ambulatory Visit: Payer: Self-pay

## 2018-09-16 DIAGNOSIS — Z881 Allergy status to other antibiotic agents status: Secondary | ICD-10-CM | POA: Diagnosis not present

## 2018-09-16 DIAGNOSIS — Z880 Allergy status to penicillin: Secondary | ICD-10-CM | POA: Diagnosis not present

## 2018-09-16 DIAGNOSIS — Z91040 Latex allergy status: Secondary | ICD-10-CM | POA: Insufficient documentation

## 2018-09-16 DIAGNOSIS — Z87891 Personal history of nicotine dependence: Secondary | ICD-10-CM | POA: Diagnosis not present

## 2018-09-16 DIAGNOSIS — Z421 Encounter for breast reconstruction following mastectomy: Secondary | ICD-10-CM | POA: Diagnosis not present

## 2018-09-16 DIAGNOSIS — Z888 Allergy status to other drugs, medicaments and biological substances status: Secondary | ICD-10-CM | POA: Diagnosis not present

## 2018-09-16 DIAGNOSIS — Z853 Personal history of malignant neoplasm of breast: Secondary | ICD-10-CM | POA: Diagnosis not present

## 2018-09-16 DIAGNOSIS — K219 Gastro-esophageal reflux disease without esophagitis: Secondary | ICD-10-CM | POA: Insufficient documentation

## 2018-09-16 DIAGNOSIS — Z886 Allergy status to analgesic agent status: Secondary | ICD-10-CM | POA: Diagnosis not present

## 2018-09-16 DIAGNOSIS — F419 Anxiety disorder, unspecified: Secondary | ICD-10-CM | POA: Diagnosis not present

## 2018-09-16 DIAGNOSIS — Z45812 Encounter for adjustment or removal of left breast implant: Secondary | ICD-10-CM | POA: Diagnosis not present

## 2018-09-16 DIAGNOSIS — L905 Scar conditions and fibrosis of skin: Secondary | ICD-10-CM | POA: Insufficient documentation

## 2018-09-16 DIAGNOSIS — Z9013 Acquired absence of bilateral breasts and nipples: Secondary | ICD-10-CM | POA: Diagnosis not present

## 2018-09-16 DIAGNOSIS — Z79899 Other long term (current) drug therapy: Secondary | ICD-10-CM | POA: Diagnosis not present

## 2018-09-16 DIAGNOSIS — Z45811 Encounter for adjustment or removal of right breast implant: Secondary | ICD-10-CM | POA: Diagnosis not present

## 2018-09-16 DIAGNOSIS — Z885 Allergy status to narcotic agent status: Secondary | ICD-10-CM | POA: Insufficient documentation

## 2018-09-16 HISTORY — PX: REMOVAL OF BILATERAL TISSUE EXPANDERS WITH PLACEMENT OF BILATERAL BREAST IMPLANTS: SHX6431

## 2018-09-16 SURGERY — REMOVAL, TISSUE EXPANDER, BREAST, BILATERAL, WITH BILATERAL IMPLANT IMPLANT INSERTION
Anesthesia: Regional | Site: Breast | Laterality: Bilateral

## 2018-09-16 MED ORDER — ROCURONIUM BROMIDE 50 MG/5ML IV SOSY
PREFILLED_SYRINGE | INTRAVENOUS | Status: AC
  Filled 2018-09-16: qty 5

## 2018-09-16 MED ORDER — LIDOCAINE HCL (CARDIAC) PF 100 MG/5ML IV SOSY
PREFILLED_SYRINGE | INTRAVENOUS | Status: DC | PRN
  Administered 2018-09-16: 60 mg via INTRAVENOUS

## 2018-09-16 MED ORDER — DEXAMETHASONE SODIUM PHOSPHATE 4 MG/ML IJ SOLN
INTRAMUSCULAR | Status: DC | PRN
  Administered 2018-09-16: 10 mg via INTRAVENOUS

## 2018-09-16 MED ORDER — ACETAMINOPHEN 10 MG/ML IV SOLN
1000.0000 mg | Freq: Once | INTRAVENOUS | Status: AC
  Administered 2018-09-16: 1000 mg via INTRAVENOUS

## 2018-09-16 MED ORDER — FENTANYL CITRATE (PF) 100 MCG/2ML IJ SOLN
INTRAMUSCULAR | Status: AC
  Filled 2018-09-16: qty 2

## 2018-09-16 MED ORDER — SCOPOLAMINE 1 MG/3DAYS TD PT72: 1.0000 | MEDICATED_PATCH | Freq: Once | TRANSDERMAL | Status: DC | PRN

## 2018-09-16 MED ORDER — FENTANYL CITRATE (PF) 100 MCG/2ML IJ SOLN
25.0000 ug | INTRAMUSCULAR | Status: DC | PRN
  Administered 2018-09-16: 25 ug via INTRAVENOUS

## 2018-09-16 MED ORDER — DIPHENHYDRAMINE HCL 50 MG/ML IJ SOLN
INTRAMUSCULAR | Status: DC | PRN
  Administered 2018-09-16: 12.5 mg via INTRAVENOUS

## 2018-09-16 MED ORDER — SODIUM CHLORIDE 0.9 % IV SOLN
INTRAVENOUS | Status: DC | PRN
  Administered 2018-09-16: 500 mL

## 2018-09-16 MED ORDER — DEXAMETHASONE SODIUM PHOSPHATE 10 MG/ML IJ SOLN
INTRAMUSCULAR | Status: AC
  Filled 2018-09-16: qty 1

## 2018-09-16 MED ORDER — MIDAZOLAM HCL 2 MG/2ML IJ SOLN
INTRAMUSCULAR | Status: AC
  Filled 2018-09-16: qty 2

## 2018-09-16 MED ORDER — ONDANSETRON HCL 4 MG/2ML IJ SOLN
INTRAMUSCULAR | Status: AC
  Filled 2018-09-16: qty 2

## 2018-09-16 MED ORDER — SUGAMMADEX SODIUM 200 MG/2ML IV SOLN
INTRAVENOUS | Status: DC | PRN
  Administered 2018-09-16: 200 mg via INTRAVENOUS

## 2018-09-16 MED ORDER — PROPOFOL 10 MG/ML IV BOLUS
INTRAVENOUS | Status: DC | PRN
  Administered 2018-09-16: 150 mg via INTRAVENOUS

## 2018-09-16 MED ORDER — PROPOFOL 500 MG/50ML IV EMUL
INTRAVENOUS | Status: DC | PRN
  Administered 2018-09-16: 200 ug/kg/min via INTRAVENOUS

## 2018-09-16 MED ORDER — CHLORHEXIDINE GLUCONATE CLOTH 2 % EX PADS: 6.0000 | MEDICATED_PAD | Freq: Once | CUTANEOUS | Status: DC

## 2018-09-16 MED ORDER — MIDAZOLAM HCL 2 MG/2ML IJ SOLN
1.0000 mg | INTRAMUSCULAR | Status: DC | PRN
  Administered 2018-09-16: 2 mg via INTRAVENOUS

## 2018-09-16 MED ORDER — LIDOCAINE 2% (20 MG/ML) 5 ML SYRINGE
INTRAMUSCULAR | Status: AC
  Filled 2018-09-16: qty 5

## 2018-09-16 MED ORDER — LACTATED RINGERS IV SOLN
INTRAVENOUS | Status: DC
  Administered 2018-09-16 (×2): via INTRAVENOUS

## 2018-09-16 MED ORDER — BUPIVACAINE-EPINEPHRINE 0.25% -1:200000 IJ SOLN
INTRAMUSCULAR | Status: DC | PRN
  Administered 2018-09-16: 8 mL

## 2018-09-16 MED ORDER — FENTANYL CITRATE (PF) 100 MCG/2ML IJ SOLN
50.0000 ug | INTRAMUSCULAR | Status: AC | PRN
  Administered 2018-09-16: 50 ug via INTRAVENOUS
  Administered 2018-09-16: 100 ug via INTRAVENOUS
  Administered 2018-09-16 (×2): 50 ug via INTRAVENOUS

## 2018-09-16 MED ORDER — ROCURONIUM BROMIDE 100 MG/10ML IV SOLN
INTRAVENOUS | Status: DC | PRN
  Administered 2018-09-16: 50 mg via INTRAVENOUS

## 2018-09-16 MED ORDER — ACETAMINOPHEN 10 MG/ML IV SOLN
INTRAVENOUS | Status: AC
  Filled 2018-09-16: qty 100

## 2018-09-16 MED ORDER — CLINDAMYCIN PHOSPHATE 900 MG/50ML IV SOLN: 900.0000 mg | INTRAVENOUS | Status: DC

## 2018-09-16 MED ORDER — PROPOFOL 10 MG/ML IV BOLUS
INTRAVENOUS | Status: AC
  Filled 2018-09-16: qty 40

## 2018-09-16 MED ORDER — VANCOMYCIN HCL 1000 MG IV SOLR
INTRAVENOUS | Status: DC | PRN
  Administered 2018-09-16: 1000 mg via INTRAVENOUS

## 2018-09-16 SURGICAL SUPPLY — 66 items
BAG DECANTER FOR FLEXI CONT (MISCELLANEOUS) ×2 IMPLANT
BINDER BREAST LRG (GAUZE/BANDAGES/DRESSINGS) IMPLANT
BINDER BREAST MEDIUM (GAUZE/BANDAGES/DRESSINGS) IMPLANT
BINDER BREAST XLRG (GAUZE/BANDAGES/DRESSINGS) IMPLANT
BINDER BREAST XXLRG (GAUZE/BANDAGES/DRESSINGS) IMPLANT
BIOPATCH RED 1 DISK 7.0 (GAUZE/BANDAGES/DRESSINGS) IMPLANT
BLADE HEX COATED 2.75 (ELECTRODE) ×2 IMPLANT
BLADE SURG 15 STRL LF DISP TIS (BLADE) ×2 IMPLANT
BLADE SURG 15 STRL SS (BLADE) ×2
BNDG GAUZE ELAST 4 BULKY (GAUZE/BANDAGES/DRESSINGS) ×4 IMPLANT
CANISTER SUCT 1200ML W/VALVE (MISCELLANEOUS) ×2 IMPLANT
CHLORAPREP W/TINT 26ML (MISCELLANEOUS) ×2 IMPLANT
CORD BIPOLAR FORCEPS 12FT (ELECTRODE) IMPLANT
COVER BACK TABLE 60X90IN (DRAPES) ×2 IMPLANT
COVER MAYO STAND STRL (DRAPES) ×2 IMPLANT
COVER WAND RF STERILE (DRAPES) IMPLANT
DECANTER SPIKE VIAL GLASS SM (MISCELLANEOUS) IMPLANT
DERMABOND ADVANCED (GAUZE/BANDAGES/DRESSINGS)
DERMABOND ADVANCED .7 DNX12 (GAUZE/BANDAGES/DRESSINGS) IMPLANT
DRAIN CHANNEL 19F RND (DRAIN) IMPLANT
DRAPE LAPAROSCOPIC ABDOMINAL (DRAPES) ×2 IMPLANT
DRSG PAD ABDOMINAL 8X10 ST (GAUZE/BANDAGES/DRESSINGS) ×4 IMPLANT
ELECT BLADE 4.0 EZ CLEAN MEGAD (MISCELLANEOUS) ×2
ELECT REM PT RETURN 9FT ADLT (ELECTROSURGICAL) ×2
ELECTRODE BLDE 4.0 EZ CLN MEGD (MISCELLANEOUS) ×1 IMPLANT
ELECTRODE REM PT RTRN 9FT ADLT (ELECTROSURGICAL) ×1 IMPLANT
EVACUATOR SILICONE 100CC (DRAIN) IMPLANT
GAUZE SPONGE 4X4 12PLY STRL LF (GAUZE/BANDAGES/DRESSINGS) IMPLANT
GLOVE BIO SURGEON STRL SZ 6.5 (GLOVE) ×4 IMPLANT
GLOVE SURG SS PI 6.5 STRL IVOR (GLOVE) ×8 IMPLANT
GLOVE SURG SS PI 7.0 STRL IVOR (GLOVE) ×4 IMPLANT
GOWN STRL REUS W/ TWL LRG LVL3 (GOWN DISPOSABLE) ×2 IMPLANT
GOWN STRL REUS W/TWL LRG LVL3 (GOWN DISPOSABLE) ×2
IMPL BREAST SMOOTH UH 430CC (Breast) ×2 IMPLANT
IMPLANT BREAST SMOOTH UH 430CC (Breast) ×4 IMPLANT
IV NS 1000ML (IV SOLUTION)
IV NS 1000ML BAXH (IV SOLUTION) IMPLANT
IV NS 500ML (IV SOLUTION)
IV NS 500ML BAXH (IV SOLUTION) IMPLANT
KIT FILL SYSTEM UNIVERSAL (SET/KITS/TRAYS/PACK) IMPLANT
NDL SAFETY ECLIPSE 18X1.5 (NEEDLE) ×1 IMPLANT
NEEDLE HYPO 18GX1.5 SHARP (NEEDLE) ×1
NEEDLE HYPO 25X1 1.5 SAFETY (NEEDLE) ×2 IMPLANT
PACK BASIN DAY SURGERY FS (CUSTOM PROCEDURE TRAY) ×2 IMPLANT
PENCIL BUTTON HOLSTER BLD 10FT (ELECTRODE) ×2 IMPLANT
PIN SAFETY STERILE (MISCELLANEOUS) IMPLANT
SIZER BREAST GEL REUSE 430CC (SIZER) ×2
SIZER BRST GEL REUSE 430CC (SIZER) ×1 IMPLANT
SLEEVE SCD COMPRESS KNEE MED (MISCELLANEOUS) ×2 IMPLANT
SPONGE LAP 18X18 RF (DISPOSABLE) ×4 IMPLANT
STRIP CLOSURE SKIN 1/2X4 (GAUZE/BANDAGES/DRESSINGS) ×2 IMPLANT
STRIP SUTURE WOUND CLOSURE 1/2 (SUTURE) IMPLANT
SUT MNCRL AB 4-0 PS2 18 (SUTURE) ×2 IMPLANT
SUT MON AB 3-0 SH 27 (SUTURE) ×2
SUT MON AB 3-0 SH27 (SUTURE) ×2 IMPLANT
SUT MON AB 5-0 PS2 18 (SUTURE) ×2 IMPLANT
SUT PDS AB 2-0 CT2 27 (SUTURE) IMPLANT
SUT VIC AB 3-0 SH 27 (SUTURE)
SUT VIC AB 3-0 SH 27X BRD (SUTURE) IMPLANT
SUT VICRYL 4-0 PS2 18IN ABS (SUTURE) IMPLANT
SYR BULB IRRIGATION 50ML (SYRINGE) ×2 IMPLANT
SYR CONTROL 10ML LL (SYRINGE) IMPLANT
TOWEL GREEN STERILE FF (TOWEL DISPOSABLE) ×4 IMPLANT
TUBE CONNECTING 20X1/4 (TUBING) ×2 IMPLANT
UNDERPAD 30X30 (UNDERPADS AND DIAPERS) ×4 IMPLANT
YANKAUER SUCT BULB TIP NO VENT (SUCTIONS) ×2 IMPLANT

## 2018-09-16 NOTE — Anesthesia Postprocedure Evaluation (Signed)
Anesthesia Post Note  Patient: [name]  Procedure(s) Performed: REMOVAL OF BILATERAL TISSUE EXPANDERS WITH PLACEMENT OF BILATERAL BREAST IMPLANTS (Bilateral Breast)     Patient location during evaluation: PACU Anesthesia Type: Regional Level of consciousness: awake and alert Pain management: pain level controlled Vital Signs Assessment: post-procedure vital signs reviewed and stable Respiratory status: spontaneous breathing, nonlabored ventilation, respiratory function stable and patient connected to nasal cannula oxygen Cardiovascular status: blood pressure returned to baseline and stable Postop Assessment: no apparent nausea or vomiting Anesthetic complications: no    Last Vitals:  Vitals:   09/16/18 1700 09/16/18 1730  BP: 133/89 (!) 152/92  Pulse: 82 97  Resp: 11 16  Temp:  36.9 C  SpO2: 99% 98%    Last Pain:  Vitals:   09/16/18 1730  TempSrc:   PainSc: 8                  Dalis Beers DANIEL

## 2018-09-16 NOTE — Discharge Instructions (Signed)
No Tylenol until 7:45pm   Post Anesthesia Home Care Instructions  Activity: Get plenty of rest for the remainder of the day. A responsible individual must stay with you for 24 hours following the procedure.  For the next 24 hours, DO NOT: -Drive a car -Paediatric nurse -Drink alcoholic beverages -Take any medication unless instructed by your physician -Make any legal decisions or sign important papers.  Meals: Start with liquid foods such as gelatin or soup. Progress to regular foods as tolerated. Avoid greasy, spicy, heavy foods. If nausea and/or vomiting occur, drink only clear liquids until the nausea and/or vomiting subsides. Call your physician if vomiting continues.  Special Instructions/Symptoms: Your throat may feel dry or sore from the anesthesia or the breathing tube placed in your throat during surgery. If this causes discomfort, gargle with warm salt water. The discomfort should disappear within 24 hours.  If you had a scopolamine patch placed behind your ear for the management of post- operative nausea and/or vomiting:  1. The medication in the patch is effective for 72 hours, after which it should be removed.  Wrap patch in a tissue and discard in the trash. Wash hands thoroughly with soap and water. 2. You may remove the patch earlier than 72 hours if you experience unpleasant side effects which may include dry mouth, dizziness or visual disturbances. 3. Avoid touching the patch. Wash your hands with soap and water after contact with the patch.

## 2018-09-16 NOTE — Anesthesia Procedure Notes (Signed)
Procedure Name: Intubation Performed by: Verita Lamb, CRNA Pre-anesthesia Checklist: Patient identified, Emergency Drugs available, Suction available, Patient being monitored and Timeout performed Patient Re-evaluated:Patient Re-evaluated prior to induction Oxygen Delivery Method: Circle system utilized Preoxygenation: Pre-oxygenation with 100% oxygen Induction Type: IV induction Ventilation: Mask ventilation without difficulty Laryngoscope Size: Mac and 3 Grade View: Grade I Tube type: Oral Tube size: 7.0 mm Airway Equipment and Method: Stylet Placement Confirmation: ETT inserted through vocal cords under direct vision,  positive ETCO2,  CO2 detector and breath sounds checked- equal and bilateral Secured at: 22 cm Tube secured with: Tape Dental Injury: Teeth and Oropharynx as per pre-operative assessment

## 2018-09-16 NOTE — Transfer of Care (Signed)
Immediate Anesthesia Transfer of Care Note  Patient: Ashley [name]  Procedure(s) Performed: REMOVAL OF BILATERAL TISSUE EXPANDERS WITH PLACEMENT OF BILATERAL BREAST IMPLANTS (Bilateral Breast)  Patient Location: PACU  Anesthesia Type:MAC  Level of Consciousness: awake, alert  and oriented  Airway & Oxygen Therapy: Patient Spontanous Breathing and Patient connected to face mask oxygen  Post-op Assessment: Report given to RN and Post -op Vital signs reviewed and stable  Post vital signs: Reviewed and stable  Last Vitals:  Vitals Value Taken Time  BP 147/89 09/16/2018  3:30 PM  Temp    Pulse 87 09/16/2018  3:31 PM  Resp 21 09/16/2018  3:31 PM  SpO2 100 % 09/16/2018  3:31 PM  Vitals shown include unvalidated device data.  Last Pain:  Vitals:   09/16/18 1203  TempSrc: Oral  PainSc: 10-Worst pain ever      Patients Stated Pain Goal: 3 (19/14/78 2956)  Complications: No apparent anesthesia complications

## 2018-09-16 NOTE — Interval H&P Note (Signed)
History and Physical Interval Note:  09/16/2018 12:23 PM  Ashley [name]  has presented today for surgery, with the diagnosis of Breast Cancer  The various methods of treatment have been discussed with the patient and family. After consideration of risks, benefits and other options for treatment, the patient has consented to  Procedure(s): REMOVAL OF BILATERAL TISSUE EXPANDERS WITH PLACEMENT OF BILATERAL BREAST IMPLANTS (Bilateral) as a surgical intervention .  The patient's history has been reviewed, patient examined, no change in status, stable for surgery.  I have reviewed the patient's chart and labs.  Questions were answered to the patient's satisfaction.     Loel Lofty Dillingham

## 2018-09-16 NOTE — Anesthesia Preprocedure Evaluation (Addendum)
Anesthesia Evaluation  Patient identified by MRN, date of birth, ID band Patient awake    Reviewed: Allergy & Precautions, NPO status , Patient's Chart, lab work & pertinent test results  History of Anesthesia Complications (+) PONV  Airway Mallampati: I  TM Distance: >3 FB Neck ROM: Full    Dental no notable dental hx. (+) Dental Advisory Given,    Pulmonary former smoker,    Pulmonary exam normal breath sounds clear to auscultation       Cardiovascular negative cardio ROS Normal cardiovascular exam Rhythm:Regular Rate:Normal     Neuro/Psych  Headaches, Anxiety negative psych ROS   GI/Hepatic negative GI ROS, Neg liver ROS, GERD  ,  Endo/Other  negative endocrine ROS  Renal/GU negative Renal ROS  negative genitourinary   Musculoskeletal negative musculoskeletal ROS (+)   Abdominal   Peds  Hematology negative hematology ROS (+)   Anesthesia Other Findings Right breast cancer  Reproductive/Obstetrics                           Anesthesia Physical Anesthesia Plan  ASA: II  Anesthesia Plan: General   Post-op Pain Management:    Induction: Intravenous  PONV Risk Score and Plan: 4 or greater and Midazolam, Dexamethasone, Ondansetron and TIVA  Airway Management Planned: Oral ETT  Additional Equipment:   Intra-op Plan:   Post-operative Plan: Extubation in OR  Informed Consent: I have reviewed the patients History and Physical, chart, labs and discussed the procedure including the risks, benefits and alternatives for the proposed anesthesia with the patient or authorized representative who has indicated his/her understanding and acceptance.   Dental advisory given  Plan Discussed with: CRNA  Anesthesia Plan Comments:        Anesthesia Quick Evaluation

## 2018-09-16 NOTE — Op Note (Signed)
Op report Bilateral Exchange   DATE OF OPERATION: 09/16/2018  LOCATION: [facility]  SURGICAL DIVISION: Plastic Surgery  PREOPERATIVE DIAGNOSES:  1.History of breast cancer.  2. Acquired absence of bilateral breast.   POSTOPERATIVE DIAGNOSES:  1. History of breast cancer.  2. Acquired absence of bilateral breast.   PROCEDURE:  1. Bilateral exchange of tissue expanders for implants.  2. Bilateral capsulotomies for implant respositioning.  SURGEON: Claire Sanger Dillingham, DO  ANESTHESIA:  General.   COMPLICATIONS: None.   IMPLANTS: Left - Mentor Smooth Round Ultra High Profile Gel 430cc. Ref #[id number].  Serial Number [id number] Right - Mentor Smooth Round Ultra High Profile Gel 430cc. Ref #[id number].  Serial Number [id number]  INDICATIONS FOR PROCEDURE:  The patient, Ashley [name], is a 42 y.o. female born on 1976/02/01, is here for treatment after bilateral mastectomies.  She had tissue expanders placed at the time of mastectomies. She now presents for exchange of her expanders for implants.  She requires capsulotomies to better position the implants. MRN: [id number]  CONSENT:  Informed consent was obtained directly from the patient. Risks, benefits and alternatives were fully discussed. Specific risks including but not limited to bleeding, infection, hematoma, seroma, scarring, pain, implant infection, implant extrusion, capsular contracture, asymmetry, wound healing problems, and need for further surgery were all discussed. The patient did have an ample opportunity to have her questions answered to her satisfaction.   DESCRIPTION OF PROCEDURE:  The patient was taken to the operating room. SCDs were placed and IV antibiotics were given. The patient's chest was prepped and draped in a sterile fashion. A time out was performed and the implants to be used were identified.    On the right breast: One percent Lidocaine with epinephrine was used to  infiltrate at the incision site. The old mastectomy scar was excised.  The mastectomy flaps from the superior and inferior flaps were raised over the pectoralis major muscle for several centimeters to minimize tension for the closure. The pectoralis was split inferior to the skin incision to expose and remove the tissue expander.  Inspection of the pocket showed a normal healthy capsule and good integration of the biologic matrix.  The pocket was irrigated with antibiotic solution.  Circumferential capsulotomies were performed to allow for breast pocket expansion.  Measurements were made and a sizer used to confirm adequate pocket size for the implant dimensions.  Hemostasis was ensured with electrocautery. New gloves were placed. The implant was soaked in antibiotic solution and then placed in the pocket and oriented appropriately. The pectoralis major muscle and capsule on the anterior surface were re-closed with a 3-0 Monocryl suture. The remaining skin was closed with 4-0 Monocryl deep dermal and 5-0 Monocryl subcuticular stitches.   On the left breast: The old mastectomy scar was excised.  The mastectomy flaps from the superior and inferior flaps were raised over the pectoralis major muscle for several centimeters to minimize tension for the closure. The pectoralis was split inferior to the skin incision to expose and remove the tissue expander.  Inspection of the pocket showed a normal healthy capsule and good integration of the biologic matrix.   Circumferential capsulotomies were performed to allow for breast pocket expansion.  Measurements were made and a sizer utilized to confirm adequate pocket size for the implant dimensions.  Hemostasis was ensured with the electrocautery.  New gloves were applied. The implant was soaked in antibiotic solution and placed in the pocket and oriented appropriately. The pectoralis major muscle and  capsule on the anterior surface were re-closed with a 3-0 Monocryl  suture. The remaining skin was closed with 4-0 Monocryl deep dermal and 5-0 Monocryl subcuticular stitches.  Dermabond was applied to the incision site. A breast binder and ABDs were placed.  The patient was allowed to wake from anesthesia and taken to the recovery room in satisfactory condition.

## 2018-09-17 ENCOUNTER — Encounter (HOSPITAL_BASED_OUTPATIENT_CLINIC_OR_DEPARTMENT_OTHER): Payer: Self-pay | Admitting: Plastic Surgery

## 2018-09-18 NOTE — Addendum Note (Signed)
Addendum  created 09/18/18 0636 by Tawni Millers, CRNA   Charge Capture section accepted

## 2018-09-20 ENCOUNTER — Ambulatory Visit (INDEPENDENT_AMBULATORY_CARE_PROVIDER_SITE_OTHER): Payer: BLUE CROSS/BLUE SHIELD | Admitting: Family Medicine

## 2018-09-24 ENCOUNTER — Encounter: Payer: Self-pay | Admitting: Plastic Surgery

## 2018-09-24 ENCOUNTER — Ambulatory Visit (INDEPENDENT_AMBULATORY_CARE_PROVIDER_SITE_OTHER): Payer: BLUE CROSS/BLUE SHIELD | Admitting: Plastic Surgery

## 2018-09-24 ENCOUNTER — Other Ambulatory Visit: Payer: Self-pay

## 2018-09-24 VITALS — BP 144/80 | HR 82 | Wt 133.0 lb

## 2018-09-24 DIAGNOSIS — Z9889 Other specified postprocedural states: Secondary | ICD-10-CM

## 2018-09-24 DIAGNOSIS — Z9013 Acquired absence of bilateral breasts and nipples: Secondary | ICD-10-CM

## 2018-09-24 DIAGNOSIS — Z853 Personal history of malignant neoplasm of breast: Secondary | ICD-10-CM

## 2018-09-24 DIAGNOSIS — Z803 Family history of malignant neoplasm of breast: Secondary | ICD-10-CM

## 2018-09-25 ENCOUNTER — Encounter: Payer: Self-pay | Admitting: Plastic Surgery

## 2018-09-25 DIAGNOSIS — Z853 Personal history of malignant neoplasm of breast: Secondary | ICD-10-CM | POA: Insufficient documentation

## 2018-09-25 DIAGNOSIS — Z9889 Other specified postprocedural states: Secondary | ICD-10-CM | POA: Insufficient documentation

## 2018-09-25 NOTE — Progress Notes (Signed)
   Subjective:    Patient ID: Ashley [name], female    DOB: [birth year], 42 y.o.   MRN: [id number]  The patient is a 42 yrs old wf here for follow up after bilateral breast surgery for placement of the implants after removal of the expanders.  She is doing very well at home.  Her pain is well controlled.  She is walking and eating without difficultly.  The incisions are covered with the steri strips.  No sign of redness, seroma or hematoma.     Review of Systems  Constitutional: Negative.   HENT: Negative.   Eyes: Negative.   Respiratory: Negative.   Cardiovascular: Negative.   Gastrointestinal: Negative.   Genitourinary: Negative.   Musculoskeletal: Negative.   Hematological: Negative.   Psychiatric/Behavioral: Negative.       Objective:   Physical Exam  Constitutional: She is oriented to person, place, and time. She appears well-developed and well-nourished.  HENT:  Head: Normocephalic and atraumatic.  Cardiovascular: Normal rate.  Pulmonary/Chest: Effort normal.  Neurological: She is alert and oriented to person, place, and time.  Skin: Skin is warm. No rash noted. No erythema.  Psychiatric: She has a normal mood and affect. Her behavior is normal. Thought content normal.      Assessment & Plan:  Acquired absence of breast and absent nipple, bilateral  Family history of breast cancer  History of breast cancer in female  Status post bilateral breast reconstruction  Continue no heavy lifting.  Can drive when not on any narcotics.  May shower.  Continue sports bra.

## 2018-09-30 DIAGNOSIS — J069 Acute upper respiratory infection, unspecified: Secondary | ICD-10-CM | POA: Diagnosis not present

## 2018-09-30 DIAGNOSIS — J029 Acute pharyngitis, unspecified: Secondary | ICD-10-CM | POA: Diagnosis not present

## 2018-09-30 DIAGNOSIS — L509 Urticaria, unspecified: Secondary | ICD-10-CM | POA: Diagnosis not present

## 2018-09-30 DIAGNOSIS — J329 Chronic sinusitis, unspecified: Secondary | ICD-10-CM | POA: Diagnosis not present

## 2018-10-03 ENCOUNTER — Encounter: Payer: Self-pay | Admitting: Plastic Surgery

## 2018-10-03 ENCOUNTER — Ambulatory Visit (INDEPENDENT_AMBULATORY_CARE_PROVIDER_SITE_OTHER): Payer: BLUE CROSS/BLUE SHIELD | Admitting: Plastic Surgery

## 2018-10-03 VITALS — BP 120/60 | HR 60

## 2018-10-03 DIAGNOSIS — Z853 Personal history of malignant neoplasm of breast: Secondary | ICD-10-CM

## 2018-10-03 DIAGNOSIS — Z9013 Acquired absence of bilateral breasts and nipples: Secondary | ICD-10-CM

## 2018-10-03 DIAGNOSIS — Z9889 Other specified postprocedural states: Secondary | ICD-10-CM

## 2018-10-03 MED ORDER — IBUPROFEN 800 MG PO TABS: 800.0000 mg | ORAL_TABLET | Freq: Three times a day (TID) | ORAL | 0 refills | Status: DC | PRN

## 2018-10-03 NOTE — Addendum Note (Signed)
Addended by: Wallace Going on: 10/03/2018 12:16 PM   Modules accepted: Orders

## 2018-10-03 NOTE — Progress Notes (Signed)
   Subjective:    Patient ID: Ashley [name], female    DOB: [birth year], 42 y.o.   MRN: [id number]  The patient is a 42 yrs old wf here for follow up after bilateral removal of expanders and placement of implants.  She is doing very well.  No sign of fluid, seroma, hematoma or infection.  There is no redness.  Swelling as expected.  She has some back pain from laying on the cough.  She is otherwise pleased with the results.     Review of Systems  Constitutional: Negative.   HENT: Negative.   Eyes: Negative.   Respiratory: Negative.   Cardiovascular: Negative.   Gastrointestinal: Negative.   Endocrine: Negative.   Genitourinary: Negative.   Musculoskeletal: Negative.   Hematological: Negative.   Psychiatric/Behavioral: Negative.        Objective:   Physical Exam  Constitutional: She appears well-developed and well-nourished.  HENT:  Head: Normocephalic and atraumatic.  Eyes: Pupils are equal, round, and reactive to light.  Cardiovascular: Normal rate.  Pulmonary/Chest: Effort normal.  Neurological: She is alert.  Skin: Skin is warm.  Psychiatric: She has a normal mood and affect. Her behavior is normal. Thought content normal.       Assessment & Plan:  History of breast cancer in female  Status post bilateral breast reconstruction  Acquired absence of breast and absent nipple, bilateral  Can get the area wet and shower like usual.  Keep steri strips in place.  Continue sports bra.  Start very light massage inward.

## 2018-10-18 ENCOUNTER — Ambulatory Visit (INDEPENDENT_AMBULATORY_CARE_PROVIDER_SITE_OTHER): Payer: BLUE CROSS/BLUE SHIELD | Admitting: Physician Assistant

## 2018-10-18 ENCOUNTER — Encounter: Payer: Self-pay | Admitting: Plastic Surgery

## 2018-10-18 ENCOUNTER — Ambulatory Visit: Payer: BLUE CROSS/BLUE SHIELD | Admitting: Plastic Surgery

## 2018-10-18 VITALS — BP 110/70 | HR 81 | Ht 64.0 in | Wt 132.0 lb

## 2018-10-18 DIAGNOSIS — D0511 Intraductal carcinoma in situ of right breast: Secondary | ICD-10-CM | POA: Diagnosis not present

## 2018-10-18 DIAGNOSIS — Z9013 Acquired absence of bilateral breasts and nipples: Secondary | ICD-10-CM

## 2018-10-18 DIAGNOSIS — Z9889 Other specified postprocedural states: Secondary | ICD-10-CM

## 2018-10-18 MED ORDER — FLUCONAZOLE 150 MG PO TABS: 150.0000 mg | ORAL_TABLET | Freq: Once | ORAL | 0 refills | Status: AC

## 2018-10-18 NOTE — Progress Notes (Signed)
  Subjective:     Patient ID: Ashley [name], female   DOB: 07-12-1976, 42 y.o.   MRN: [id number]  HPI Very pleasant 42 year old white female presents to the clinic s/p removal of expanders and placements of implants on 10/14.  Pt overall is doing well. She still notes tenderness bilaterally.  She reports concerns that her implant was ruptured on the left last week but was assured by her significant other that there was no abnormality.  Review of Systems  Constitutional: Negative.   HENT: Negative.   Eyes: Negative.   Respiratory: Negative.   Cardiovascular: Negative.   Gastrointestinal: Negative.   Genitourinary: Negative.   Skin: Positive for wound.  Neurological: Negative.   Psychiatric/Behavioral: The patient is nervous/anxious.        Objective:   Physical Exam  Constitutional: She is oriented to person, place, and time. She appears well-developed and well-nourished.  HENT:  Head: Normocephalic.  Neck: Normal range of motion.  Pulmonary/Chest: Effort normal.  Musculoskeletal: Normal range of motion.  Neurological: She is alert and oriented to person, place, and time.  Skin: Skin is warm and dry.  Psychiatric: She has a normal mood and affect. Her behavior is normal. Judgment and thought content normal.  steri strips removed from incisions b/l.  No edema, no erythema no sign of infection  Incisions are healing well      Assessment:     S/p placement of b/l implants 30 days ago     Plan:     Pt will continue to press her breasts medially with hands or arms 3-4 times a day Pt may continue to take ibuprofen and tylenol prn Pt will return in 3 weeks and pictures will be taken at that time

## 2018-10-18 NOTE — Progress Notes (Deleted)
   Subjective:    Patient ID: Ashley [name], female    DOB: 1976-04-14, 42 y.o.   MRN: [id number]  HPI    Review of Systems     Objective:   Physical Exam        Assessment & Plan:

## 2018-10-18 NOTE — Progress Notes (Deleted)
   Subjective:    Patient ID: Ashley [name], female    DOB: 08-11-1976, 42 y.o.   MRN: [id number]  HPI    Review of Systems  Constitutional: Negative.   HENT: Negative.   Eyes: Negative.   Respiratory: Negative.   Gastrointestinal: Negative.   Genitourinary: Negative.   Musculoskeletal: Negative.   Skin: Negative.   Neurological: Negative.        Objective:   Physical Exam  Constitutional: She appears well-developed and well-nourished.  HENT:  Head: Normocephalic and atraumatic.  Cardiovascular: Normal rate.  Pulmonary/Chest: Effort normal.  Abdominal: Soft.  Neurological: She is alert.  Psychiatric: She has a normal mood and affect. Her behavior is normal. Judgment and thought content normal.          Assessment & Plan:

## 2018-10-23 DIAGNOSIS — R0982 Postnasal drip: Secondary | ICD-10-CM | POA: Diagnosis not present

## 2018-10-23 DIAGNOSIS — B373 Candidiasis of vulva and vagina: Secondary | ICD-10-CM | POA: Diagnosis not present

## 2018-11-08 ENCOUNTER — Encounter: Payer: Self-pay | Admitting: Plastic Surgery

## 2018-11-08 ENCOUNTER — Ambulatory Visit (INDEPENDENT_AMBULATORY_CARE_PROVIDER_SITE_OTHER): Payer: BLUE CROSS/BLUE SHIELD | Admitting: Plastic Surgery

## 2018-11-08 VITALS — BP 135/80 | HR 94 | Resp 12 | Ht 64.0 in | Wt 136.0 lb

## 2018-11-08 DIAGNOSIS — Z9889 Other specified postprocedural states: Secondary | ICD-10-CM

## 2018-11-08 NOTE — Addendum Note (Signed)
Addended by: Wallace Going on: 11/08/2018 04:22 PM   Modules accepted: Orders
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
## 2018-11-11 DIAGNOSIS — Z87891 Personal history of nicotine dependence: Secondary | ICD-10-CM | POA: Diagnosis not present

## 2018-11-11 DIAGNOSIS — Z91040 Latex allergy status: Secondary | ICD-10-CM | POA: Diagnosis not present

## 2018-11-11 DIAGNOSIS — Z853 Personal history of malignant neoplasm of breast: Secondary | ICD-10-CM | POA: Diagnosis not present

## 2018-11-11 DIAGNOSIS — G43909 Migraine, unspecified, not intractable, without status migrainosus: Secondary | ICD-10-CM | POA: Diagnosis not present

## 2018-11-11 DIAGNOSIS — Z880 Allergy status to penicillin: Secondary | ICD-10-CM | POA: Diagnosis not present

## 2018-11-11 DIAGNOSIS — K219 Gastro-esophageal reflux disease without esophagitis: Secondary | ICD-10-CM | POA: Diagnosis not present

## 2018-11-11 DIAGNOSIS — Z881 Allergy status to other antibiotic agents status: Secondary | ICD-10-CM | POA: Diagnosis not present

## 2018-11-11 DIAGNOSIS — Z886 Allergy status to analgesic agent status: Secondary | ICD-10-CM | POA: Diagnosis not present

## 2018-11-11 DIAGNOSIS — Z79899 Other long term (current) drug therapy: Secondary | ICD-10-CM | POA: Diagnosis not present

## 2018-11-11 DIAGNOSIS — Z888 Allergy status to other drugs, medicaments and biological substances status: Secondary | ICD-10-CM | POA: Diagnosis not present

## 2018-11-11 DIAGNOSIS — Z882 Allergy status to sulfonamides status: Secondary | ICD-10-CM | POA: Diagnosis not present

## 2018-11-11 DIAGNOSIS — J0110 Acute frontal sinusitis, unspecified: Secondary | ICD-10-CM | POA: Diagnosis not present

## 2018-11-25 DIAGNOSIS — J0140 Acute pansinusitis, unspecified: Secondary | ICD-10-CM | POA: Diagnosis not present

## 2018-11-25 DIAGNOSIS — L089 Local infection of the skin and subcutaneous tissue, unspecified: Secondary | ICD-10-CM | POA: Diagnosis not present

## 2018-11-25 DIAGNOSIS — R21 Rash and other nonspecific skin eruption: Secondary | ICD-10-CM | POA: Diagnosis not present

## 2018-12-02 ENCOUNTER — Telehealth: Payer: Self-pay | Admitting: *Deleted

## 2018-12-02 NOTE — Telephone Encounter (Addendum)
Patient called requesting an appointment with Dr. Janalyn Shy for a rash.  Patient was informed that Dr. Marla Roe was out of the office today, and tomorrows schedule was filled up, but we could try to work her in first thing in the morning.  Patient stated that she's not sure if she could come early in the morning.  I spoke with the patient and she stated that she has little bumps on her upper chest and they are itching.  Patient stated that she was seen at a Urgent Care office last Monday, and was told the rash was a skin infection and was given a cream to use.  Asked the patient if she has an primary care provider and she stated that she does not.  Informed the patient that she can try Bendryl 25mg  QHS, and a antihistamine like (Zyretec,Claritin, or Allegra).  Patient stated that she's unable to take the antihistamines, but she can use the Bendryl cream.  She said she just wanted to make sure the rash was not coming from the breast reconstruction.  Patient again stated that she still not sure if she will be able to come early in the morning, but she will try.//AB/CMA

## 2018-12-03 ENCOUNTER — Encounter

## 2018-12-13 ENCOUNTER — Encounter: Payer: Self-pay | Admitting: Plastic Surgery

## 2018-12-13 ENCOUNTER — Ambulatory Visit (INDEPENDENT_AMBULATORY_CARE_PROVIDER_SITE_OTHER): Payer: BLUE CROSS/BLUE SHIELD | Admitting: Plastic Surgery

## 2018-12-13 VITALS — BP 135/88 | HR 83 | Temp 98.1°F | Ht 64.0 in | Wt 136.5 lb

## 2018-12-13 DIAGNOSIS — Z9013 Acquired absence of bilateral breasts and nipples: Secondary | ICD-10-CM

## 2018-12-13 DIAGNOSIS — Z9889 Other specified postprocedural states: Secondary | ICD-10-CM

## 2018-12-13 MED ORDER — CLOBETASOL PROP EMOLLIENT BASE 0.05 % EX CREA: 1.0000 "application " | TOPICAL_CREAM | Freq: Two times a day (BID) | CUTANEOUS | 0 refills | Status: DC

## 2018-12-13 NOTE — Progress Notes (Signed)
   Subjective:    Patient ID: Ashley [name], female    DOB: 1976/05/17, 43 y.o.   MRN: [id number]  The patient is a 43 year old female here for follow-up on bilateral breast reconstruction.  She is doing much better overall and is back to work.  Her incisions are healing well there is no sign of infection, seroma or hematoma.  She is pleased with her results.   Review of Systems  Constitutional: Negative.   HENT: Negative.   Eyes: Negative.   Respiratory: Negative.   Gastrointestinal: Negative.   Endocrine: Negative.   Genitourinary: Negative.   Musculoskeletal: Negative.   Skin: Negative.        Objective:   Physical Exam Vitals signs and nursing note reviewed.  Constitutional:      Appearance: Normal appearance.  HENT:     Head: Normocephalic and atraumatic.  Cardiovascular:     Rate and Rhythm: Normal rate.  Neurological:     General: No focal deficit present.     Mental Status: She is alert.  Psychiatric:        Mood and Affect: Mood normal.        Thought Content: Thought content normal.        Judgment: Judgment normal.       Assessment & Plan:  Status post bilateral breast reconstruction  Acquired absence of breast and absent nipple, bilateral We will plan on nipple areole a reconstruction with tattoo placement

## 2018-12-27 ENCOUNTER — Encounter: Payer: Self-pay | Admitting: Family Medicine

## 2018-12-27 ENCOUNTER — Ambulatory Visit (INDEPENDENT_AMBULATORY_CARE_PROVIDER_SITE_OTHER): Payer: BLUE CROSS/BLUE SHIELD | Admitting: Family Medicine

## 2018-12-27 VITALS — BP 144/100 | HR 96 | Temp 97.8°F | Resp 18 | Ht 64.0 in | Wt 137.0 lb

## 2018-12-27 DIAGNOSIS — L739 Follicular disorder, unspecified: Secondary | ICD-10-CM | POA: Diagnosis not present

## 2018-12-27 DIAGNOSIS — J029 Acute pharyngitis, unspecified: Secondary | ICD-10-CM | POA: Diagnosis not present

## 2018-12-27 DIAGNOSIS — R030 Elevated blood-pressure reading, without diagnosis of hypertension: Secondary | ICD-10-CM

## 2018-12-27 MED ORDER — SULFAMETHOXAZOLE-TRIMETHOPRIM 800-160 MG PO TABS: 1.0000 | ORAL_TABLET | Freq: Two times a day (BID) | ORAL | 0 refills | Status: DC
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
## 2018-12-29 LAB — CULTURE, GROUP A STREP
MICRO NUMBER: 101311
SOURCE:: 0
SPECIMEN QUALITY:: ADEQUATE

## 2018-12-29 LAB — STREP GROUP A AG, W/REFLEX TO CULT: STREPTOCOCCUS, GROUP A SCREEN (DIRECT): NOT DETECTED

## 2019-01-01 ENCOUNTER — Ambulatory Visit (HOSPITAL_COMMUNITY): Payer: BLUE CROSS/BLUE SHIELD

## 2019-01-02 ENCOUNTER — Telehealth: Payer: Self-pay | Admitting: *Deleted

## 2019-01-02 NOTE — Telephone Encounter (Signed)
Patient returned call.   States that she has been taking Bactrim for possible folliculitis on chest. States that last night she noted increased itching, hives to chest and arms, and redness. Reports that she has had similar reaction to Bactrim in the past, but it was prior to last year's surgeries where she was given Bactrim for prophylaxis with no issues.   States that she has stopped medication and has taken Benadryl for itching. Advised she can also try H2 inhibitor like Pepcid.  Reports that irritation to chest has cleared up greatly, but is not completely resolved. States that she is continuing to use Polytrim on area.   MD please advise.

## 2019-01-02 NOTE — Telephone Encounter (Signed)
Just use polytrim for now.  Only other option would be doxycycline to which she is allergic. Could try cleocin twice daily applied to the area.

## 2019-01-02 NOTE — Telephone Encounter (Signed)
Received VM from patient.   Reports that she is having rash and hives and thinks that it is due to a medication.   Call placed to patient. Ashley [name].

## 2019-01-02 NOTE — Telephone Encounter (Signed)
Call placed to patient and patient made aware per VM.  

## 2019-01-07 ENCOUNTER — Encounter: Payer: Self-pay | Admitting: Family Medicine

## 2019-01-07 ENCOUNTER — Ambulatory Visit (INDEPENDENT_AMBULATORY_CARE_PROVIDER_SITE_OTHER): Payer: BLUE CROSS/BLUE SHIELD | Admitting: Family Medicine

## 2019-01-07 VITALS — BP 116/78 | HR 82 | Temp 97.8°F | Resp 16 | Ht 64.0 in | Wt 131.0 lb

## 2019-01-07 DIAGNOSIS — R6889 Other general symptoms and signs: Secondary | ICD-10-CM

## 2019-01-07 DIAGNOSIS — J029 Acute pharyngitis, unspecified: Secondary | ICD-10-CM | POA: Diagnosis not present
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
## 2019-01-09 LAB — INFLUENZA A AND B AG, IMMUNOASSAY
INFLUENZA A ANTIGEN: NOT DETECTED
INFLUENZA B ANTIGEN: NOT DETECTED

## 2019-01-09 LAB — STREP GROUP A AG, W/REFLEX TO CULT: Streptococcus, Group A Screen (Direct): NOT DETECTED

## 2019-01-09 LAB — CULTURE, GROUP A STREP
MICRO NUMBER:: 147938
SPECIMEN QUALITY:: ADEQUATE

## 2019-01-17 ENCOUNTER — Encounter (HOSPITAL_COMMUNITY): Payer: Self-pay | Admitting: Physical Therapy

## 2019-01-17 ENCOUNTER — Other Ambulatory Visit: Payer: Self-pay

## 2019-01-17 ENCOUNTER — Ambulatory Visit (HOSPITAL_COMMUNITY): Payer: BLUE CROSS/BLUE SHIELD | Attending: Plastic Surgery | Admitting: Physical Therapy

## 2019-01-17 DIAGNOSIS — M25611 Stiffness of right shoulder, not elsewhere classified: Secondary | ICD-10-CM

## 2019-01-17 DIAGNOSIS — M25612 Stiffness of left shoulder, not elsewhere classified: Secondary | ICD-10-CM | POA: Diagnosis not present

## 2019-01-17 DIAGNOSIS — R29898 Other symptoms and signs involving the musculoskeletal system: Secondary | ICD-10-CM | POA: Insufficient documentation

## 2019-01-17 DIAGNOSIS — M6281 Muscle weakness (generalized): Secondary | ICD-10-CM

## 2019-01-17 NOTE — Patient Instructions (Addendum)
Wand exercise    This exercise helps increase your ability to move your shoulders forward. You will need a broom handle, yardstick, or other stick-like object to use as the wand in this exercise. Do these exercises on a bed or the floor. Lie on your back with your knees bent and your feet flat. Hold the wand across your belly in both hands with your palms facing up. Lift the wand up over your head as far as you can. Use your unaffected arm to help lift the wand until you feel a stretch in your affected arm. Hold for 5 seconds. Lower arms and repeat 10 times.   Elbow winging  This exercise helps increase the movement in the front of your chest and shoulder. It may take many weeks of regular exercise before your elbows will get close to the bed or floor. Do these exercises on a bed or the floor. Lie on your back with your knees bent and your feet flat. Clasp your hands behind your neck with your elbows pointing toward the ceiling. Move your elbows apart and down toward the bed or floor. Repeat 10 times.  Shoulder blade stretch   This exercise helps increase your shoulder blade movement. Sit in a chair very close to a table with your back against the back of the chair. Place the unaffected arm on the table with your elbow bent and palm down. Do not move this arm during the exercise. Place the affected arm on the table, palm down, with your elbow straight. Without moving your trunk, slide the affected arm forward, toward the opposite side of the table. You should feel your shoulder blade move as you do this. Relax your arm and repeat 10 times.   Shoulder blade squeeze   This exercise also helps increase shoulder blade movement and improve posture. Sit in a chair in front of a mirror. Face straight ahead. Do not rest against the back of the chair. Your arms should be at your sides with your elbows bent. Squeeze your shoulder blades together, bringing your elbows behind you toward your  spine. Elbows will move with you, but don't force the motion with your elbows. Keep your shoulders level as you do this. Do not lift your shoulders up toward your ears. Return to the starting position and repeat 10 times.  Side bends   This exercise helps increase movement of your trunk and body. Sit in a chair and clasp your hands together in front of you. Lift your arms slowly over your head, straightening your arms. When your arms are over your head, bend your trunk to the right keeping your arms overhead. Return to the starting position and bend to the left. Repeat 5 to 7 times.  Chest wall stretch  This exercise helps stretch your chest. Stand facing a corner with your toes about 8 to 10 inches from the corner. Bend your elbows and put your forearms on the wall, one on each side of the corner. Your elbows should be as close to shoulder height as possible. Keep your arms and feet in place and move your chest toward the corner. You will feel a stretch across your chest and shoulders. Return to the starting position and repeat 10 times. The picture shows stretching both sides at the same time, but you may find it more comfortable to stretch one arm at a time. Be sure you keep your shoulders dropped far away from your ears as you do this stretch. Keep your  ears over your shoulders to avoid making your neck sore.  Shoulder stretch   This exercise helps increase your mobility in your shoulder. Stand facing the wall with your toes about 8 to 10 inches from the wall. Put your hands on the wall. Use your fingers to "climb the wall," reaching as high as you can until you feel a stretch. Return to the starting position and repeat 10 times. The picture shows both arms going up at the same time, but you might find it easier to raise one arm at a time. Be sure you keep your shoulders dropped far away from your ears as you raise your arms. Keep your ears over your shoulders to avoid making your  neck sore.
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
## 2019-01-24 ENCOUNTER — Telehealth (HOSPITAL_COMMUNITY): Payer: Self-pay | Admitting: Physical Therapy

## 2019-01-24 ENCOUNTER — Ambulatory Visit (HOSPITAL_COMMUNITY): Payer: BLUE CROSS/BLUE SHIELD | Admitting: Physical Therapy

## 2019-01-24 NOTE — Telephone Encounter (Signed)
Therapist called regarding patient not showing up for appointment at 4:45. Patient reported that she was sick and thought that the clinic may not have been open due to the weather. Therapist stated she hoped patient felt better and reminded her of her next scheduled appointment and to call if she was unable to attend.  Clarene Critchley PT, DPT 5:22 PM, 01/24/19 [phone]

## 2019-01-25 DIAGNOSIS — R0981 Nasal congestion: Secondary | ICD-10-CM | POA: Diagnosis not present

## 2019-01-25 DIAGNOSIS — R51 Headache: Secondary | ICD-10-CM | POA: Diagnosis not present

## 2019-01-27 DIAGNOSIS — M542 Cervicalgia: Secondary | ICD-10-CM | POA: Diagnosis not present

## 2019-01-27 DIAGNOSIS — Z79899 Other long term (current) drug therapy: Secondary | ICD-10-CM | POA: Diagnosis not present

## 2019-01-27 DIAGNOSIS — J0191 Acute recurrent sinusitis, unspecified: Secondary | ICD-10-CM | POA: Diagnosis not present

## 2019-01-27 DIAGNOSIS — M545 Low back pain: Secondary | ICD-10-CM | POA: Diagnosis not present

## 2019-01-31 ENCOUNTER — Encounter: Payer: Self-pay | Admitting: Family Medicine

## 2019-01-31 ENCOUNTER — Ambulatory Visit (INDEPENDENT_AMBULATORY_CARE_PROVIDER_SITE_OTHER): Payer: BLUE CROSS/BLUE SHIELD | Admitting: Family Medicine

## 2019-01-31 VITALS — BP 120/74 | HR 99 | Temp 97.8°F | Resp 14 | Ht 64.0 in | Wt 133.0 lb

## 2019-01-31 DIAGNOSIS — Z0000 Encounter for general adult medical examination without abnormal findings: Secondary | ICD-10-CM

## 2019-01-31 DIAGNOSIS — Z124 Encounter for screening for malignant neoplasm of cervix: Secondary | ICD-10-CM | POA: Diagnosis not present
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
## 2019-02-01 LAB — CBC WITH DIFFERENTIAL/PLATELET
Absolute Monocytes: 531 cells/uL (ref 200–950)
BASOS PCT: 1 %
Basophils Absolute: 59 cells/uL (ref 0–200)
EOS ABS: 142 {cells}/uL (ref 15–500)
Eosinophils Relative: 2.4 %
HCT: 37.7 % (ref 35.0–45.0)
Hemoglobin: 12.9 g/dL (ref 11.7–15.5)
Lymphs Abs: 1339 cells/uL (ref 850–3900)
MCH: 31.6 pg (ref 27.0–33.0)
MCHC: 34.2 g/dL (ref 32.0–36.0)
MCV: 92.4 fL (ref 80.0–100.0)
MONOS PCT: 9 %
MPV: 12.7 fL — AB (ref 7.5–12.5)
Neutro Abs: 3829 cells/uL (ref 1500–7800)
Neutrophils Relative %: 64.9 %
PLATELETS: 180 10*3/uL (ref 140–400)
RBC: 4.08 10*6/uL (ref 3.80–5.10)
RDW: 11.5 % (ref 11.0–15.0)
TOTAL LYMPHOCYTE: 22.7 %
WBC: 5.9 10*3/uL (ref 3.8–10.8)

## 2019-02-01 LAB — COMPLETE METABOLIC PANEL WITH GFR
AG Ratio: 2 (calc) (ref 1.0–2.5)
ALKALINE PHOSPHATASE (APISO): 55 U/L (ref 31–125)
ALT: 9 U/L (ref 6–29)
AST: 11 U/L (ref 10–30)
Albumin: 4.5 g/dL (ref 3.6–5.1)
BILIRUBIN TOTAL: 0.4 mg/dL (ref 0.2–1.2)
BUN: 12 mg/dL (ref 7–25)
CHLORIDE: 107 mmol/L (ref 98–110)
CO2: 25 mmol/L (ref 20–32)
Calcium: 9.3 mg/dL (ref 8.6–10.2)
Creat: 0.88 mg/dL (ref 0.50–1.10)
GFR, Est African American: 94 mL/min/{1.73_m2} (ref >=60)
GFR, Est Non African American: 81 mL/min/{1.73_m2} (ref >=60)
GLUCOSE: 78 mg/dL (ref 65–99)
Globulin: 2.3 g/dL (calc) (ref 1.9–3.7)
Potassium: 4.3 mmol/L (ref 3.5–5.3)
Sodium: 141 mmol/L (ref 135–146)
Total Protein: 6.8 g/dL (ref 6.1–8.1)

## 2019-02-03 ENCOUNTER — Telehealth: Payer: Self-pay | Admitting: Family Medicine

## 2019-02-03 LAB — PAP, TP IMAGING W/ HPV RNA, RFLX HPV TYPE 16,18/45: HPV DNA HIGH RISK: NOT DETECTED

## 2019-02-03 NOTE — Telephone Encounter (Signed)
Patient called in stating that she is experiencing some spotting. She stated that it started on Friday after her pap smear. She has an IUD. Advised patient that spotting can occur sometimes post pap advised to give it another day and if no better to call for an appointment. Patient verbalized understanding.

## 2019-02-07 ENCOUNTER — Telehealth (HOSPITAL_COMMUNITY): Payer: Self-pay | Admitting: Family Medicine

## 2019-02-07 ENCOUNTER — Ambulatory Visit (HOSPITAL_COMMUNITY): Payer: BLUE CROSS/BLUE SHIELD | Admitting: Physical Therapy

## 2019-02-07 NOTE — Telephone Encounter (Signed)
02/07/19  PT LEFT A MESSAGE TO CX SAID IT IS FOR PERSONAL REASONS

## 2019-02-13 DIAGNOSIS — Z83511 Family history of glaucoma: Secondary | ICD-10-CM | POA: Diagnosis not present

## 2019-02-13 DIAGNOSIS — H5213 Myopia, bilateral: Secondary | ICD-10-CM | POA: Diagnosis not present

## 2019-02-13 DIAGNOSIS — H52223 Regular astigmatism, bilateral: Secondary | ICD-10-CM | POA: Diagnosis not present

## 2019-02-13 DIAGNOSIS — H40003 Preglaucoma, unspecified, bilateral: Secondary | ICD-10-CM | POA: Diagnosis not present

## 2019-02-14 ENCOUNTER — Telehealth (HOSPITAL_COMMUNITY): Payer: Self-pay | Admitting: Family Medicine

## 2019-02-14 ENCOUNTER — Ambulatory Visit (HOSPITAL_COMMUNITY): Payer: BLUE CROSS/BLUE SHIELD | Admitting: Physical Therapy

## 2019-02-14 NOTE — Telephone Encounter (Signed)
02/14/19  pt said that money was the issue for this week's cancellation

## 2019-02-21 ENCOUNTER — Telehealth (HOSPITAL_COMMUNITY): Payer: Self-pay | Admitting: Physical Therapy

## 2019-02-21 ENCOUNTER — Ambulatory Visit (HOSPITAL_COMMUNITY): Payer: BLUE CROSS/BLUE SHIELD | Admitting: Physical Therapy

## 2019-02-21 NOTE — Telephone Encounter (Signed)
Therapist called regarding office closure for the next 2 weeks, patient reported understanding and stated she has been trying to do her home exercises at home. She stated she would prefer to be called once clinic is open again to discuss re-scheduling cancelled appointment.   Clarene Critchley PT, DPT 9:49 AM, 02/21/19 [phone]

## 2019-03-22 ENCOUNTER — Telehealth: Payer: BC Managed Care – PPO | Admitting: Physician Assistant

## 2019-03-22 ENCOUNTER — Encounter: Payer: BLUE CROSS/BLUE SHIELD | Admitting: Physician Assistant

## 2019-03-22 DIAGNOSIS — B9789 Other viral agents as the cause of diseases classified elsewhere: Secondary | ICD-10-CM

## 2019-03-22 DIAGNOSIS — J019 Acute sinusitis, unspecified: Secondary | ICD-10-CM

## 2019-03-22 MED ORDER — IPRATROPIUM BROMIDE 0.03 % NA SOLN: 2.0000 | Freq: Two times a day (BID) | NASAL | 0 refills | Status: DC

## 2019-03-22 MED ORDER — CEFDINIR 300 MG PO CAPS: 300.0000 mg | ORAL_CAPSULE | Freq: Two times a day (BID) | ORAL | 0 refills | Status: DC

## 2019-03-22 NOTE — Progress Notes (Signed)
Duplicate encounter. Please see addendum to earlier e-visit.

## 2019-03-22 NOTE — Progress Notes (Signed)
I have spent 5 minutes in review of e-visit questionnaire, review and updating patient chart, medical decision making and response to patient.   Chanay Nugent Cody Jaser Fullen, PA-C    

## 2019-03-22 NOTE — Progress Notes (Signed)
Patient sent new e-visit questionnaire wanting call to discuss symptoms giving her history. During discussion, patient currently with 5 days of sinusitis symptoms with significant maxillary pain. Has significant history of sinusitis, even s/p sinus surgery a few years prior.   Giving history and typical need for antibiotic, did send in Rx Cefdinir 300 mg BID x 10 days (giving multiple medication allergies) based on prior treatment success.

## 2019-03-22 NOTE — Telephone Encounter (Signed)
This encounter was created in error - please disregard.

## 2019-03-22 NOTE — Addendum Note (Signed)
Addended by: Brunetta Jeans on: 03/22/2019 02:13 PM   Modules accepted: Orders

## 2019-03-22 NOTE — Progress Notes (Signed)

## 2019-04-15 ENCOUNTER — Telehealth (HOSPITAL_COMMUNITY): Payer: Self-pay

## 2019-04-15 NOTE — Telephone Encounter (Signed)
I called Ashley [name] and confirmed her identity. I informed her that our office is starting to re-open and that if she would like to resume care we can do so for her physical therapy for her bilateral shoulder stiffness. She stated she has a lot going on and is trying to get therapy set up for her dad. She states she was told he cannot come into our clinic at this time and is confused. I informed her if he scored high risk on the COVID-19 screening he would not be safe to be seen in clinic and this may be why he was recommended to have [place] instead. I told her I will discuss this with our OT and ask them to give her a call. She will be discharged from her episode of physical therapy as she does not feel she has time to participate in it currently.   Kipp Brood, PT, DPT, [facility] Physical Therapist with [facility]  04/15/2019 1:24 PM

## 2019-04-23 ENCOUNTER — Other Ambulatory Visit: Payer: Self-pay

## 2019-04-23 ENCOUNTER — Ambulatory Visit (INDEPENDENT_AMBULATORY_CARE_PROVIDER_SITE_OTHER): Payer: BLUE CROSS/BLUE SHIELD | Admitting: Family Medicine

## 2019-04-23 ENCOUNTER — Encounter: Payer: Self-pay | Admitting: Family Medicine

## 2019-04-23 VITALS — BP 102/78 | HR 79 | Temp 97.8°F | Resp 16 | Ht 64.0 in | Wt 131.6 lb

## 2019-04-23 DIAGNOSIS — K649 Unspecified hemorrhoids: Secondary | ICD-10-CM | POA: Diagnosis not present

## 2019-04-23 DIAGNOSIS — K5900 Constipation, unspecified: Secondary | ICD-10-CM

## 2019-04-23 DIAGNOSIS — K625 Hemorrhage of anus and rectum: Secondary | ICD-10-CM

## 2019-04-23 MED ORDER — HYDROCORTISONE (PERIANAL) 2.5 % EX CREA: TOPICAL_CREAM | CUTANEOUS | 0 refills | Status: DC

## 2019-04-23 MED ORDER — PANTOPRAZOLE SODIUM 40 MG PO TBEC: 40.0000 mg | DELAYED_RELEASE_TABLET | Freq: Every day | ORAL | 3 refills | Status: DC

## 2019-04-23 NOTE — Progress Notes (Signed)
Patient ID: Ashley [name], female    DOB: 21-May-1976, 43 y.o.   MRN: [id number]  PCP: Susy Frizzle, MD  Chief Complaint  Patient presents with  . Rectal Bleeding    clots when going to the bathroom, started 05/19    Subjective:   Ashley [name] is a 43 y.o. female, presents to clinic with CC of darker red blood per rectum for the last 24 hours to 2 days ago.  History of similar and history of constipation and hemorrhoids.  She states that she has not had a real bowel movement or large full stool she has had some small stools past but has tried to go to the bathroom several times with passing gas or attempting to have a bowel movement she has seen some blood clots some mucus type bloody appearing discharge from her rectum and has seen some blood on the toilet paper.  She denies any diarrhea, nausea, abdominal pain, flank pain, near syncope, palpitations, lightheadedness, rapid heart rate, cold intolerance, exertional shortness of breath. Rectal Bleeding   The current episode started 2 days ago. The onset is undetermined. The problem occurs frequently. The problem has been gradually improving. The pain is moderate. The stool is described as hard (constipated not much stool passed). Prior successful therapies include stool softeners. Associated symptoms include hemorrhoids and rectal pain. Pertinent negatives include no anorexia, no fever, no abdominal pain, no diarrhea, no hematemesis, no nausea, no vomiting, no hematuria, no vaginal bleeding, no vaginal discharge, no chest pain, no headaches, no coughing, no difficulty breathing and no rash.  She has been having some headaches which she attributes to stress she says that she takes about 800 mg ibuprofen 1-2 times a day in the morning and in the evening.  Denies any abdominal bloating, indigestion, acid reflux.  She is not taking any medications for upper GI symptoms, constipation or for hemorrhoids at all.   Patient Active Problem List    Diagnosis Date Noted  . Status post bilateral breast reconstruction 09/25/2018  . History of breast cancer in female 09/25/2018  . Acquired absence of breast and absent nipple, bilateral 09/03/2018  . Breast cancer ([place]) 04/30/2018  . Malignant neoplasm of overlapping sites of right female breast ([place]) 04/05/2018  . Genetic testing 03/22/2018  . Family history of breast cancer   . 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 Migraine headache 09/01/2013  . Microscopic hematuria 09/01/2013  . DERMATITIS, SEBORRHEIC NOS 06/21/2007  . MASS, SUPERFICIAL 06/21/2007  . DYSURIA 06/04/2007  . Pap smear of cervix with ASCUS, cannot exclude HGSIL 05/28/2007  . HEADACHE 04/24/2007  . SINUSITIS 03/29/2007  . IRREGULAR MENSES 03/29/2007  . Allergic rhinitis, cause unspecified 01/31/2007  . CONSTIPATION 01/31/2007  . ACNE 01/31/2007  . DERMATITIS NOS 01/31/2007  . BACK PAIN, LOW 01/31/2007  . HIGH RISK PATIENT 01/31/2007     Prior to Admission medications   Medication Sig Start Date End Date Taking? Authorizing Provider  acetaminophen (TYLENOL) 500 MG tablet Take 500 mg by mouth every 6 (six) hours as needed.   Yes [provider]  Clobetasol Prop Emollient Base 0.05 % emollient cream Apply 1 application topically 2 (two) times daily. 12/13/18  Yes Dillingham, Loel Lofty, DO  ibuprofen (ADVIL,MOTRIN) 800 MG tablet Take 1 tablet (800 mg total) by mouth every 8 (eight) hours as needed. 10/03/18  Yes Dillingham, Loel Lofty, DO  ipratropium (ATROVENT) 0.03 % nasal spray Place 2 sprays into both  nostrils every 12 (twelve) hours. 03/22/19  Yes Brunetta Jeans, PA-C  Levonorgestrel [facility]) 19.5 MG IUD 1 each by Intrauterine route once.  03/21/18  Yes [provider]  omeprazole (PRILOSEC) 40 MG capsule Take 1 capsule (40 mg total) by mouth daily. 08/28/18  Yes Susy Frizzle, MD  promethazine (PHENERGAN) 25 MG tablet Take 1 tablet (25 mg total) by mouth every 6 (six)  hours as needed for nausea or vomiting. 02/26/18  Yes Coralie Keens, MD  propranolol (INDERAL) 10 MG tablet Take 1 tablet (10 mg total) at bedtime by mouth. Patient taking differently: Take 10 mg by mouth at bedtime.  10/12/17  Yes Earth, Modena Nunnery, MD  rizatriptan (MAXALT) 5 MG tablet Take 1 tablet (5 mg total) by mouth as needed for migraine. May repeat in 2 hours if needed Patient taking differently: Take 10 mg by mouth every 2 (two) hours as needed for migraine (may repeat in 2 hours if needed).  05/29/17  Yes [place], Modena Nunnery, MD  cefdinir (OMNICEF) 300 MG capsule Take 1 capsule (300 mg total) by mouth 2 (two) times daily. Patient not taking: Reported on 04/23/2019 03/22/19   Brunetta Jeans, PA-C     Allergies  Allergen Reactions  . Prednisone Palpitations  . Doxycycline Nausea And Vomiting  . Aspirin Hives  . Biaxin [Clarithromycin] Other (See Comments)    Induced migraine  . Ciprofloxacin Rash  . Diphenhydramine Hcl Palpitations and Other (See Comments)    *doesn't like the way the injection makes her feel*  . Latex Itching and Rash  . Levaquin [Levofloxacin In D5w] Hives  . Penicillins Hives and Other (See Comments)    Has patient had a PCN reaction causing immediate rash, facial/tongue/throat swelling, SOB or lightheadedness with hypotension: No Has patient had a PCN reaction causing severe rash involving mucus membranes or skin necrosis: No Has patient had a PCN reaction that required hospitalization: No Has patient had a PCN reaction occurring within the last 10 years: No If all of the above answers are "NO", then may proceed with Cephalosporin use.   . Vicodin [Hydrocodone-Acetaminophen] Nausea And Vomiting     Family History  Problem Relation Age of Onset  . Hypertension Father   . Diabetes Father   . Hypertension Mother   . Breast cancer Maternal Grandmother        dx over 29, d. 70-80s  . Lung cancer Maternal Aunt   . Cancer Maternal Uncle        unknown   . Heart disease Paternal Aunt   . Heart disease Paternal Grandfather      Social History   Socioeconomic History  . Marital status: Single    Spouse name: Not on file  . Number of children: 2  . Years of education: Not on file  . Highest education level: Not on file  Occupational History  . Not on file  Social Needs  . Financial resource strain: Not on file  . Food insecurity:    Worry: Not on file    Inability: Not on file  . Transportation needs:    Medical: Not on file    Non-medical: Not on file  Tobacco Use  . Smoking status: Former Smoker    Last attempt to quit: 03/06/2004    Years since quitting: 15.1  . Smokeless tobacco: Never Used  Substance and Sexual Activity  . Alcohol use: No  . Drug use: No  . Sexual activity: Yes    Birth  control/protection: I.U.D.  Lifestyle  . Physical activity:    Days per week: Not on file    Minutes per session: Not on file  . Stress: Not on file  Relationships  . Social connections:    Talks on phone: Not on file    Gets together: Not on file    Attends religious service: Not on file    Active member of club or organization: Not on file    Attends meetings of clubs or organizations: Not on file    Relationship status: Not on file  . Intimate partner violence:    Fear of current or ex partner: Not on file    Emotionally abused: Not on file    Physically abused: Not on file    Forced sexual activity: Not on file  Other Topics Concern  . Not on file  Social History Narrative  . Not on file     Review of Systems  Constitutional: Negative.  Negative for fever.  HENT: Negative.   Eyes: Negative.   Respiratory: Negative.  Negative for cough.   Cardiovascular: Negative.  Negative for chest pain.  Gastrointestinal: Positive for hematochezia, hemorrhoids and rectal pain. Negative for abdominal pain, anorexia, diarrhea, hematemesis, nausea and vomiting.  Endocrine: Negative.   Genitourinary: Negative.  Negative for  hematuria, vaginal bleeding and vaginal discharge.  Musculoskeletal: Negative.   Skin: Negative.  Negative for rash.  Allergic/Immunologic: Negative.   Neurological: Negative.  Negative for headaches.  Hematological: Negative.   Psychiatric/Behavioral: Negative.   All other systems reviewed and are negative.      Objective:    There were no vitals filed for this visit.    Physical Exam Vitals signs and nursing note reviewed. Exam conducted with a chaperone present.  Constitutional:      General: She is not in acute distress.    Appearance: Normal appearance. She is well-developed. She is not ill-appearing, toxic-appearing or diaphoretic.     Comments: Very anxious appearing female otherwise well-appearing, nontoxic, alert  HENT:     Head: Normocephalic and atraumatic.     Right Ear: External ear normal.     Left Ear: External ear normal.     Nose: Nose normal.     Mouth/Throat:     Mouth: Mucous membranes are moist.     Pharynx: Oropharynx is clear. Uvula midline. No oropharyngeal exudate or posterior oropharyngeal erythema.  Eyes:     General: Lids are normal. No scleral icterus.    Conjunctiva/sclera: Conjunctivae normal.     Pupils: Pupils are equal, round, and reactive to light.     Comments: No conjunctival pallor  Neck:     Musculoskeletal: Normal range of motion and neck supple.     Trachea: Phonation normal. No tracheal deviation.  Cardiovascular:     Rate and Rhythm: Normal rate and regular rhythm.     Pulses: Normal pulses.          Radial pulses are 2+ on the right side and 2+ on the left side.       Posterior tibial pulses are 2+ on the right side and 2+ on the left side.     Heart sounds: Normal heart sounds. No murmur. No friction rub. No gallop.   Pulmonary:     Effort: Pulmonary effort is normal. No respiratory distress.     Breath sounds: Normal breath sounds. No stridor. No wheezing, rhonchi or rales.  Chest:     Chest wall: No tenderness.   Abdominal:  General: Bowel sounds are normal. There is no distension.     Palpations: Abdomen is soft.     Tenderness: There is no abdominal tenderness. There is no right CVA tenderness, left CVA tenderness, guarding or rebound.  Genitourinary:    Exam position: Knee-chest position.     Rectum: Guaiac result negative. Tenderness and external hemorrhoid present. No mass, anal fissure or internal hemorrhoid. Normal anal tone.     Comments: Multiple anal skin tags, no palpated or visualized engorged hemorrhoids, external rectal tissue that was enlarged was not tender to palpation but internal rectal vault was slightly tender, no stool palpated in rectal vault  Hemoccult was negative Musculoskeletal: Normal range of motion.        General: No deformity.  Lymphadenopathy:     Cervical: No cervical adenopathy.  Skin:    General: Skin is warm and dry.     Capillary Refill: Capillary refill takes less than 2 seconds.     Coloration: Skin is not pale.     Findings: No rash.  Neurological:     Mental Status: She is alert.     Motor: No abnormal muscle tone.     Gait: Gait normal.  Psychiatric:        Speech: Speech normal.        Behavior: Behavior normal.           Assessment & Plan:      ICD-10-CM   1. Rectal bleeding K62.5 pantoprazole (PROTONIX) 40 MG tablet    polyethylene glycol powder (GLYCOLAX/MIRALAX) 17 GM/SCOOP powder  2. Hemorrhoids, unspecified hemorrhoid type K64.9 hydrocortisone (ANUSOL-HC) 2.5 % rectal cream    polyethylene glycol powder (GLYCOLAX/MIRALAX) 17 GM/SCOOP powder  3. Constipation, unspecified constipation type K59.00 polyethylene glycol powder (GLYCOLAX/MIRALAX) 17 GM/SCOOP powder    Patient reports darker red or clotted blood coming out of her rectum, Hemoccult is negative here, she has not come in a history of worsening or more frequent or loose stools which would be suggestive of an upper GI bleed.  Did not palpate any internal hemorrhoids but there  may still be some hemorrhoids involved in her symptoms.  Did not palpate any stool in the rectal vault but patient does report constipation.  I did discuss with her conservative management of that to increase fiber, increase and push fluids, to start doing MiraLAX daily until having 1 soft stool per day in addition to moving her bowels would like to make sure that we soften stools so that it decreases trauma with bowel movements.  Discussed using hemorrhoid ointments over-the-counter like Preparation H, do believe that some local Anusol or rectal suppositories would be helpful but she refused any suppositories.    Patient did not appear anemic at all and she seemed to want to do very little to address her concerns today so did not do any blood work.  I did discuss with her at length concerning signs and symptoms which warrant ER evaluation or calling 911, included but not limited to severe abdominal pain, severe fatigue, near syncope, palpitations, shortness of breath, increasing volume of loose bloody stools.  She verbalized understanding  I did offer to refer her to a general surgeon to further evaluate her history of what she described as internal and prolapsing hemorrhoids, she declined.  Also discussed referral to gastroenterologist but she declined.  She also seem very hesitant to take MiraLAX because she states she did not like it before it "made her poop sticky and gross."  I encouraged  her to figure out what dose works for her because it causes so few side effects and it is so effective at softening stool and not causing cramping or abdominal pain.  Discussed a wide variety of dosing including starting at 1 capful per day with increasing fluids and either increasing to twice daily or decreasing to half a capful per day until she has soft nontraumatic stools with 1 bowel movement per day.  Hand outs to pts given and reviewed.   All questions asked and answered  Delsa Grana, PA-C 04/23/19 2:12 PM

## 2019-04-23 NOTE — Patient Instructions (Addendum)
Stop taking ibuprofen daily - it can lead to bleeding and/or ulcers Start taking protonix which will protect your stomach   Take miralax - 1 dose (packet or cap) a day until having one soft stool daily, increase or decrease based on stool consistency and frequency  Can try over the counter preparation H ointment applied to rectum/anus for hemorrhoids, trauma, pain  Can use the anusol 1-2 x a day for less than a week to treat inflammed tissue and hemorrhoids  Increase fluid intake and fiber intake (see constipation info below)  Call us right away if still having blood in stool.  Go to the ER if having large and frequent bloody, loose bowel movements.   Constipation, Adult Constipation is when a person:  Poops (has a bowel movement) fewer times in a week than normal.  Has a hard time pooping.  Has poop that is dry, hard, or bigger than normal. Follow these instructions at home: Eating and drinking   Eat foods that have a lot of fiber, such as: ? Fresh fruits and vegetables. ? Whole grains. ? Beans.  Eat less of foods that are high in fat, low in fiber, or overly processed, such as: ? [place] fries. ? Hamburgers. ? Cookies. ? Candy. ? Soda.  Drink enough fluid to keep your pee (urine) clear or pale yellow. General instructions  Exercise regularly or as told by your doctor.  Go to the restroom when you feel like you need to poop. Do not hold it in.  Take over-the-counter and prescription medicines only as told by your doctor. These include any fiber supplements.  Do pelvic floor retraining exercises, such as: ? Doing deep breathing while relaxing your lower belly (abdomen). ? Relaxing your pelvic floor while pooping.  Watch your condition for any changes.  Keep all follow-up visits as told by your doctor. This is important. Contact a doctor if:  You have pain that gets worse.  You have a fever.  You have not pooped for 4 days.  You throw up (vomit).  You  are not hungry.  You lose weight.  You are bleeding from the anus.  You have thin, pencil-like poop (stool). Get help right away if:  You have a fever, and your symptoms suddenly get worse.  You leak poop or have blood in your poop.  Your belly feels hard or bigger than normal (is bloated).  You have very bad belly pain.  You feel dizzy or you faint. This information is not intended to replace advice given to you by your health care provider. Make sure you discuss any questions you have with your health care provider. Document Released: 05/08/2008 Document Revised: 06/09/2016 Document Reviewed: 05/10/2016 Elsevier Interactive Patient Education  2019 [place].   Rectal Bleeding  Rectal bleeding is when blood comes out of the opening of the butt (anus). People with this kind of bleeding may notice bright red blood in their underwear or in the toilet after they poop (have a bowel movement). They may also have dark red or black poop (stool). Rectal bleeding is often a sign that something is wrong. It needs to be checked by a doctor. Follow these instructions at home: Watch for any changes in your condition. Take these actions to help with bleeding and discomfort:  Eat a diet that is high in fiber. This will keep your poop soft so it is easier for you to poop without pushing too hard. Ask your doctor to tell you what foods and  drinks are high in fiber.  Drink enough fluid to keep your pee (urine) clear or pale yellow. This also helps keep your poop soft.  Try taking a warm bath. This may help with pain.  Keep all follow-up visits as told by your doctor. This is important. Get help right away if:  You have new bleeding.  You have more bleeding than before.  You have black or dark red poop.  You throw up (vomit) blood or something that looks like coffee grounds.  You have pain or tenderness in your belly (abdomen).  You have a fever.  You feel weak.  You feel sick to  your stomach (nauseous).  You pass out (faint).  You have very bad pain in your butt.  You cannot poop. This information is not intended to replace advice given to you by your health care provider. Make sure you discuss any questions you have with your health care provider. Document Released: 08/02/2011 Document Revised: 04/27/2016 Document Reviewed: 01/16/2016 Elsevier Interactive Patient Education  [employer].

## 2019-04-24 MED ORDER — POLYETHYLENE GLYCOL 3350 17 GM/SCOOP PO POWD: 8.5000 g | Freq: Every day | ORAL | 1 refills | Status: DC

## 2019-05-22 ENCOUNTER — Telehealth: Payer: Self-pay | Admitting: *Deleted

## 2019-05-22 NOTE — Telephone Encounter (Signed)
Received call from the patient stating that she had double mastectomy with lymph nodes removed from the right and left.  She wanted to know if she can use her (R) arm to get a tattoo.  Asked her when is she to get the tattoo and she stated tonight.  She said she can use her (L) arm but she would like to use the (R) one.  She stated that she was told not to use that (R) arm to give blood.  Informed the patient that she may need to call the doctor who did her mastectomy.  She asked if I would give her a call back.   Informed the patient that I will speak with Dr. Marla Roe as soon as she contacts me and I will give her a call back.  Patient verbalized understanding and agreed.//AB/CMA

## 2019-05-22 NOTE — Telephone Encounter (Signed)
Dr. Marla Roe was informed of the message below.  Per Dr. Marla Roe the patient should use the (L) arm not the (R), but if she decides to use the (R) arm she should call the surgeon who did her mastectomy.  Called the patient back and inform her of Dr. Marla Roe message.  She stated that she has already call Dr. Trevor Mace office and they told her that she could use her (R) arm, but she's going to use her (L) instead.//AB/CMA

## 2019-06-30 DIAGNOSIS — H04123 Dry eye syndrome of bilateral lacrimal glands: Secondary | ICD-10-CM | POA: Diagnosis not present

## 2019-07-29 ENCOUNTER — Other Ambulatory Visit: Payer: Self-pay | Admitting: Family Medicine

## 2019-07-29 ENCOUNTER — Telehealth: Payer: Self-pay | Admitting: Family Medicine

## 2019-07-29 MED ORDER — DIAZEPAM 2 MG PO TABS: 2.0000 mg | ORAL_TABLET | Freq: Every evening | ORAL | 0 refills | Status: DC | PRN

## 2019-07-29 NOTE — Telephone Encounter (Signed)
Pt aware.

## 2019-07-29 NOTE — Telephone Encounter (Signed)
Pt called [facility] stating that she just lost her father a week ago and is having trouble sleeping and would like to know if something could be called in for her? (She mentioned 2 mg valium by name)

## 2019-07-29 NOTE — Telephone Encounter (Signed)
I will call out valium.

## 2019-08-06 ENCOUNTER — Ambulatory Visit
Admission: EM | Admit: 2019-08-06 | Discharge: 2019-08-06 | Disposition: A | Discharge status: Home / Self Care | Payer: BC Managed Care – PPO | Attending: Emergency Medicine | Admitting: Emergency Medicine

## 2019-08-06 ENCOUNTER — Other Ambulatory Visit: Payer: Self-pay

## 2019-08-06 DIAGNOSIS — R110 Nausea: Secondary | ICD-10-CM | POA: Insufficient documentation

## 2019-08-06 DIAGNOSIS — R5383 Other fatigue: Secondary | ICD-10-CM

## 2019-08-06 DIAGNOSIS — J029 Acute pharyngitis, unspecified: Secondary | ICD-10-CM | POA: Diagnosis not present

## 2019-08-06 DIAGNOSIS — Z20828 Contact with and (suspected) exposure to other viral communicable diseases: Secondary | ICD-10-CM | POA: Diagnosis not present

## 2019-08-06 DIAGNOSIS — R6889 Other general symptoms and signs: Secondary | ICD-10-CM | POA: Insufficient documentation

## 2019-08-06 DIAGNOSIS — Z20822 Contact with and (suspected) exposure to covid-19: Secondary | ICD-10-CM

## 2019-08-06 LAB — POCT RAPID STREP A (OFFICE): Rapid Strep A Screen: NEGATIVE

## 2019-08-06 MED ORDER — PROMETHAZINE HCL 25 MG PO TABS: 25.0000 mg | ORAL_TABLET | Freq: Three times a day (TID) | ORAL | 0 refills | Status: DC | PRN
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
## 2019-08-06 NOTE — ED Triage Notes (Signed)
Pt has sore throat and nausea for past week, had possible positive exposure

## 2019-08-06 NOTE — Discharge Instructions (Signed)
Strep test negative, will send out for culture and we will call you with results COVID testing ordered.  It will take 5-7 days for test to result.  Someone will call you with abnormal results.    In the meantime: You should remain isolated in your home for 10 days from symptom onset AND greater than 72 hours after symptoms resolution (absence of fever without the use of fever-reducing medication and improvement in respiratory symptoms), whichever is longer Get plenty of rest and push fluids Use OTC zyrtec for nasal congestion, runny nose, and/or sore throat Use OTC flonase for nasal congestion and runny nose Use medications daily for symptom relief Use OTC medications like ibuprofen or tylenol as needed fever or pain Call or go to the ED if you have any new or worsening symptoms such as fever, worsening cough, shortness of breath, chest tightness, chest pain, turning blue, changes in mental status, etc..Marland Kitchen

## 2019-08-07 LAB — NOVEL CORONAVIRUS, NAA: SARS-CoV-2, NAA: NOT DETECTED

## 2019-08-09 LAB — CULTURE, GROUP A STREP (THRC)

## 2019-08-22 ENCOUNTER — Other Ambulatory Visit: Payer: Self-pay | Admitting: Family Medicine

## 2019-08-22 DIAGNOSIS — K219 Gastro-esophageal reflux disease without esophagitis: Secondary | ICD-10-CM

## 2019-09-01 ENCOUNTER — Telehealth: Payer: Self-pay

## 2019-09-01 NOTE — Telephone Encounter (Signed)
Call back to pt re: areola/nipple tattoo- she is ready to schedule for tattoo-  she was seen by Dr.Dillingham last year and per pt she was released for the tattooing process I will call her to schedule the procedure when all of the tattoo equipment is in. [employer]

## 2019-09-04 ENCOUNTER — Other Ambulatory Visit: Payer: Self-pay

## 2019-09-04 ENCOUNTER — Ambulatory Visit
Admission: EM | Admit: 2019-09-04 | Discharge: 2019-09-04 | Disposition: A | Discharge status: Home / Self Care | Payer: BC Managed Care – PPO | Attending: Family Medicine | Admitting: Family Medicine

## 2019-09-04 ENCOUNTER — Encounter: Payer: Self-pay | Admitting: Family Medicine

## 2019-09-04 DIAGNOSIS — H9201 Otalgia, right ear: Secondary | ICD-10-CM

## 2019-09-04 DIAGNOSIS — J320 Chronic maxillary sinusitis: Secondary | ICD-10-CM

## 2019-09-04 MED ORDER — FLUCONAZOLE 200 MG PO TABS: 200.0000 mg | ORAL_TABLET | Freq: Every day | ORAL | 0 refills | Status: AC

## 2019-09-04 MED ORDER — CEFDINIR 300 MG PO CAPS: 600.0000 mg | ORAL_CAPSULE | Freq: Every day | ORAL | 0 refills | Status: DC

## 2019-09-04 MED ORDER — NEOMYCIN-POLYMYXIN-HC 3.5-10000-1 OT SOLN: 3.0000 [drp] | Freq: Four times a day (QID) | OTIC | 0 refills | Status: DC
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
## 2019-09-04 NOTE — ED Triage Notes (Signed)
Pt states she has had sinus pain and pressure with ear pain for past week

## 2019-09-26 ENCOUNTER — Telehealth: Payer: Self-pay | Admitting: Plastic Surgery

## 2019-10-06 DIAGNOSIS — D0511 Intraductal carcinoma in situ of right breast: Secondary | ICD-10-CM | POA: Diagnosis not present

## 2019-10-25 ENCOUNTER — Telehealth: Payer: BC Managed Care – PPO

## 2019-10-25 ENCOUNTER — Ambulatory Visit (INDEPENDENT_AMBULATORY_CARE_PROVIDER_SITE_OTHER)
Admission: RE | Admit: 2019-10-25 | Discharge: 2019-10-27 | Disposition: A | Discharge status: Home / Self Care | Payer: BC Managed Care – PPO | Source: Ambulatory Visit

## 2019-10-25 DIAGNOSIS — J310 Chronic rhinitis: Secondary | ICD-10-CM | POA: Diagnosis not present

## 2019-10-25 DIAGNOSIS — J00 Acute nasopharyngitis [common cold]: Secondary | ICD-10-CM

## 2019-10-25 MED ORDER — PSEUDOEPHEDRINE HCL 30 MG PO TABS: 30.0000 mg | ORAL_TABLET | Freq: Every day | ORAL | 0 refills | Status: DC

## 2019-10-25 MED ORDER — MONTELUKAST SODIUM 10 MG PO TABS: 10.0000 mg | ORAL_TABLET | Freq: Every day | ORAL | 0 refills | Status: DC

## 2019-10-25 MED ORDER — CETIRIZINE HCL 10 MG PO TABS: 10.0000 mg | ORAL_TABLET | Freq: Every day | ORAL | 0 refills | Status: DC
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
## 2019-10-27 ENCOUNTER — Ambulatory Visit
Admission: EM | Admit: 2019-10-27 | Discharge: 2019-10-27 | Disposition: A | Discharge status: Home / Self Care | Payer: BC Managed Care – PPO | Attending: Emergency Medicine | Admitting: Emergency Medicine

## 2019-10-27 ENCOUNTER — Other Ambulatory Visit: Payer: Self-pay

## 2019-10-27 DIAGNOSIS — J0100 Acute maxillary sinusitis, unspecified: Secondary | ICD-10-CM | POA: Insufficient documentation

## 2019-10-27 DIAGNOSIS — R110 Nausea: Secondary | ICD-10-CM | POA: Insufficient documentation

## 2019-10-27 DIAGNOSIS — Z20828 Contact with and (suspected) exposure to other viral communicable diseases: Secondary | ICD-10-CM | POA: Insufficient documentation

## 2019-10-27 DIAGNOSIS — Z20822 Contact with and (suspected) exposure to covid-19: Secondary | ICD-10-CM

## 2019-10-27 LAB — POCT INFLUENZA A/B
Influenza A, POC: NEGATIVE
Influenza B, POC: NEGATIVE

## 2019-10-27 LAB — POCT RAPID STREP A (OFFICE): Rapid Strep A Screen: NEGATIVE

## 2019-10-27 MED ORDER — PROMETHAZINE HCL 25 MG PO TABS: 25.0000 mg | ORAL_TABLET | Freq: Three times a day (TID) | ORAL | 0 refills | Status: DC | PRN

## 2019-10-27 MED ORDER — CEFDINIR 300 MG PO CAPS: 300.0000 mg | ORAL_CAPSULE | Freq: Two times a day (BID) | ORAL | 0 refills | Status: DC

## 2019-10-27 NOTE — Discharge Instructions (Signed)
COVID testing ordered.  It will take between 5-7 days for test results.  Someone will contact you regarding abnormal results.    In the meantime: You should remain isolated in your home for 10 days from symptom onset AND greater than 72 hours after symptoms resolution (absence of fever without the use of fever-reducing medication and improvement in respiratory symptoms), whichever is longer Get plenty of rest and push fluids Cefdinir prescribed take as directed and to completion for possible sinus infection Phenergan as needed for nausea Use OTC medications like ibuprofen or tylenol as needed fever or pain Follow up with PCP for recheck in 1-2 days to ensure symptoms are improving Call or go to the ED if you have any new or worsening symptoms such as fever, worsening cough, shortness of breath, chest tightness, chest pain, turning blue, changes in mental status, etc..Marland Kitchen
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
## 2019-10-27 NOTE — ED Triage Notes (Signed)
Pt presents with c/o nasal congestion and feeling unwell for past week , pt also reports  nausea and poor appetite for past week

## 2019-10-29 LAB — NOVEL CORONAVIRUS, NAA: SARS-CoV-2, NAA: NOT DETECTED

## 2019-10-30 LAB — CULTURE, GROUP A STREP (THRC)

## 2019-11-10 ENCOUNTER — Other Ambulatory Visit: Payer: BC Managed Care – PPO

## 2019-11-10 ENCOUNTER — Other Ambulatory Visit: Payer: Self-pay

## 2019-11-10 DIAGNOSIS — Z111 Encounter for screening for respiratory tuberculosis: Secondary | ICD-10-CM | POA: Diagnosis not present

## 2019-11-12 LAB — QUANTIFERON-TB GOLD PLUS
Mitogen-NIL: 9.8 IU/mL
NIL: 0.02 IU/mL
QuantiFERON-TB Gold Plus: NEGATIVE
TB1-NIL: 0.01 IU/mL
TB2-NIL: 0.07 IU/mL

## 2019-11-20 ENCOUNTER — Other Ambulatory Visit: Payer: Self-pay

## 2019-11-20 DIAGNOSIS — K219 Gastro-esophageal reflux disease without esophagitis: Secondary | ICD-10-CM

## 2019-11-20 MED ORDER — OMEPRAZOLE 40 MG PO CPDR: 40.0000 mg | DELAYED_RELEASE_CAPSULE | Freq: Every day | ORAL | 0 refills | Status: DC

## 2019-12-01 ENCOUNTER — Telehealth: Payer: Self-pay | Admitting: Family Medicine

## 2019-12-01 NOTE — Telephone Encounter (Signed)
Most likely due to heat and sweat from the cloth.  Use Neutrogena Oil-Free Acne Wash daily.

## 2019-12-01 NOTE — Telephone Encounter (Signed)
Patient called in stating that she has been getting a rash that looks like acne appear on her neck from mask. States that she wears cloth mask and tries to keep them washed. Would like to know if there is anything you recommend? Please advise?

## 2019-12-01 NOTE — Telephone Encounter (Signed)
Spoke with patient and informed her of Dr.Pickards recommendations. Patient verbalized understanding.

## 2019-12-19 ENCOUNTER — Ambulatory Visit
Admission: EM | Admit: 2019-12-19 | Discharge: 2019-12-19 | Disposition: A | Discharge status: Home / Self Care | Payer: 59 | Attending: Emergency Medicine | Admitting: Emergency Medicine

## 2019-12-19 ENCOUNTER — Other Ambulatory Visit: Payer: Self-pay

## 2019-12-19 DIAGNOSIS — Z886 Allergy status to analgesic agent status: Secondary | ICD-10-CM | POA: Diagnosis not present

## 2019-12-19 DIAGNOSIS — Z881 Allergy status to other antibiotic agents status: Secondary | ICD-10-CM | POA: Insufficient documentation

## 2019-12-19 DIAGNOSIS — Z20822 Contact with and (suspected) exposure to covid-19: Secondary | ICD-10-CM

## 2019-12-19 DIAGNOSIS — J028 Acute pharyngitis due to other specified organisms: Secondary | ICD-10-CM

## 2019-12-19 DIAGNOSIS — B9789 Other viral agents as the cause of diseases classified elsewhere: Secondary | ICD-10-CM

## 2019-12-19 DIAGNOSIS — Z79899 Other long term (current) drug therapy: Secondary | ICD-10-CM | POA: Diagnosis not present

## 2019-12-19 DIAGNOSIS — Z880 Allergy status to penicillin: Secondary | ICD-10-CM | POA: Insufficient documentation

## 2019-12-19 DIAGNOSIS — J029 Acute pharyngitis, unspecified: Secondary | ICD-10-CM | POA: Diagnosis not present

## 2019-12-19 DIAGNOSIS — Z885 Allergy status to narcotic agent status: Secondary | ICD-10-CM | POA: Insufficient documentation

## 2019-12-19 DIAGNOSIS — Z87891 Personal history of nicotine dependence: Secondary | ICD-10-CM | POA: Diagnosis not present

## 2019-12-19 LAB — POCT RAPID STREP A (OFFICE): Rapid Strep A Screen: NEGATIVE

## 2019-12-19 MED ORDER — LIDOCAINE VISCOUS HCL 2 % MT SOLN: 15.0000 mL | OROMUCOSAL | 0 refills | Status: DC | PRN

## 2019-12-19 MED ORDER — CHLORHEXIDINE GLUCONATE 0.12 % MT SOLN: 15.0000 mL | Freq: Two times a day (BID) | OROMUCOSAL | 0 refills | Status: DC
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
## 2019-12-19 NOTE — ED Triage Notes (Addendum)
Pt presents to UC w/ c/o sore throat x1.5 weeks.

## 2019-12-19 NOTE — Discharge Instructions (Addendum)
Point-of-care strep test was negative.  Will be sent for culture.  Someone will call if result is abnormal.  COVID testing ordered.  It will take between 2-7 days for test results.  Someone will contact you regarding abnormal results.    In the meantime: You should remain isolated in your home for 10 days from symptom onset AND greater than 72 hours after symptoms resolution (absence of fever without the use of fever-reducing medication and improvement in respiratory symptoms), whichever is longer Get plenty of rest and push fluids Use medications daily for symptom relief Use OTC medications like ibuprofen or tylenol as needed fever or pain Call or go to the ED if you have any new or worsening symptoms such as fever, worsening cough, shortness of breath, chest tightness, chest pain, turning blue, changes in mental status, etc...    Viscous lidocaine prescribed.  This is an oral solution you can swish, and gargle as needed for symptomatic relief of sore throat.  Do not exceed 8 doses in a 24 hour period.  Do not use prior to eating, as this will numb your entire mouth.   Drink warm or cool liquids, use throat lozenges, or popsicles to help alleviate symptoms Take OTC ibuprofen or tylenol as needed for pain Follow up with PCP if symptoms persists Return or go to ER if patient has any new or worsening symptoms such as fever, chills, nausea, vomiting, worsening sore throat, cough, abdominal pain, chest pain, changes in bowel or bladder habits, etc..Marland Kitchen

## 2019-12-20 LAB — NOVEL CORONAVIRUS, NAA: SARS-CoV-2, NAA: NOT DETECTED

## 2019-12-21 LAB — CULTURE, GROUP A STREP (THRC)

## 2020-01-13 ENCOUNTER — Other Ambulatory Visit: Payer: Self-pay | Admitting: Family Medicine

## 2020-01-13 DIAGNOSIS — Z9889 Other specified postprocedural states: Secondary | ICD-10-CM

## 2020-01-26 ENCOUNTER — Telehealth: Payer: Self-pay | Admitting: Plastic Surgery

## 2020-01-26 NOTE — Telephone Encounter (Signed)

## 2020-01-27 ENCOUNTER — Ambulatory Visit: Payer: 59 | Admitting: Plastic Surgery

## 2020-02-02 NOTE — Progress Notes (Signed)
Patient seen by Dr. Marla Roe  Subjective:    Patient ID: Ashley [name], female    DOB: 04-21-1976, 44 y.o.   MRN: [id number]  The patient is a 44 year old female here for evaluation of her breast reconstruction.  She underwent mastectomies in May 2019 and then secondary reconstruction in October 2019.  She has been through a lot in the past year with the virus and losing her father to colon cancer.  Overall she is doing well and healthy.  She complains of some tenderness while sleeping and having to move off of the implants.  She does not like the way they seem to be laterally positioned.  I remember in her original surgery she had very strong pectoralis muscles that need it difficult to achieve any cleavage.  There is no sign of infection and all areas are healing very nicely.  She is interested in nipple areola reconstruction with tattoos.      Review of Systems  Constitutional: Negative.  Negative for activity change.  HENT: Negative.   Eyes: Negative.   Respiratory: Positive for chest tightness. Negative for shortness of breath.   Cardiovascular: Negative for leg swelling.  Gastrointestinal: Negative.  Negative for abdominal distention and abdominal pain.  Endocrine: Negative.   Genitourinary: Negative.   Musculoskeletal: Negative.   Hematological: Negative.   Psychiatric/Behavioral: Negative.        Objective:   Physical Exam Vitals and nursing note reviewed.  Constitutional:      Appearance: Normal appearance.  HENT:     Head: Normocephalic and atraumatic.  Cardiovascular:     Rate and Rhythm: Normal rate.     Pulses: Normal pulses.  Pulmonary:     Effort: Pulmonary effort is normal. No respiratory distress.  Abdominal:     General: Abdomen is flat. There is no distension.     Tenderness: There is no abdominal tenderness.  Skin:    General: Skin is warm.     Capillary Refill: Capillary refill takes less than 2 seconds.  Neurological:     General: No focal deficit  present.     Mental Status: She is alert and oriented to person, place, and time.  Psychiatric:        Mood and Affect: Mood normal.        Behavior: Behavior normal.        Judgment: Judgment normal.       Assessment & Plan:     ICD-10-CM   1. Acquired absence of breast and absent nipple, bilateral  Z90.13   2. Status post bilateral breast reconstruction  Z98.890     Recommend nipple areolar tattooing in the office.  I think this will really help her have a more natural looking ill to the breast.  We talked about revision.  It is possible.  It is risky for increasing her risk of infection.  I am not sure how much more cleavage we can achieve due to her anatomy.  She is going to think about it.  She was given a prescription for second to nature.  She is going to see if they could make a insert for the lateral aspect of her bra that would push her implants more medially.  Pictures were obtained of the patient and placed in the chart with the patient's or guardian's permission.

## 2020-02-03 ENCOUNTER — Ambulatory Visit (INDEPENDENT_AMBULATORY_CARE_PROVIDER_SITE_OTHER): Payer: 59 | Admitting: Plastic Surgery

## 2020-02-03 ENCOUNTER — Encounter: Payer: Self-pay | Admitting: Plastic Surgery

## 2020-02-03 ENCOUNTER — Other Ambulatory Visit: Payer: Self-pay

## 2020-02-03 VITALS — BP 114/80 | HR 70 | Temp 97.9°F | Ht 65.0 in | Wt 128.0 lb

## 2020-02-03 DIAGNOSIS — Z9013 Acquired absence of bilateral breasts and nipples: Secondary | ICD-10-CM

## 2020-02-03 DIAGNOSIS — Z9889 Other specified postprocedural states: Secondary | ICD-10-CM

## 2020-02-13 ENCOUNTER — Ambulatory Visit: Payer: 59 | Admitting: Family Medicine

## 2020-02-13 ENCOUNTER — Other Ambulatory Visit: Payer: Self-pay

## 2020-02-13 DIAGNOSIS — K219 Gastro-esophageal reflux disease without esophagitis: Secondary | ICD-10-CM | POA: Diagnosis not present

## 2020-02-13 DIAGNOSIS — B353 Tinea pedis: Secondary | ICD-10-CM

## 2020-02-13 MED ORDER — CLOTRIMAZOLE 1 % EX CREA: 1.0000 "application " | TOPICAL_CREAM | Freq: Two times a day (BID) | CUTANEOUS | 1 refills | Status: DC

## 2020-02-13 MED ORDER — OMEPRAZOLE 40 MG PO CPDR: 40.0000 mg | DELAYED_RELEASE_CAPSULE | Freq: Every day | ORAL | 3 refills | Status: DC
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
## 2020-02-24 ENCOUNTER — Ambulatory Visit
Admission: EM | Admit: 2020-02-24 | Discharge: 2020-02-24 | Disposition: A | Discharge status: Home / Self Care | Payer: 59 | Attending: Emergency Medicine | Admitting: Emergency Medicine

## 2020-02-24 ENCOUNTER — Other Ambulatory Visit: Payer: Self-pay

## 2020-02-24 DIAGNOSIS — Z20822 Contact with and (suspected) exposure to covid-19: Secondary | ICD-10-CM

## 2020-02-24 DIAGNOSIS — R21 Rash and other nonspecific skin eruption: Secondary | ICD-10-CM | POA: Diagnosis not present

## 2020-02-24 DIAGNOSIS — J209 Acute bronchitis, unspecified: Secondary | ICD-10-CM

## 2020-02-24 LAB — POCT RAPID STREP A (OFFICE): Rapid Strep A Screen: NEGATIVE

## 2020-02-24 MED ORDER — FLUTICASONE PROPIONATE 50 MCG/ACT NA SUSP: 1.0000 | Freq: Every day | NASAL | 0 refills | Status: DC

## 2020-02-24 MED ORDER — TRIAMCINOLONE ACETONIDE 0.1 % EX CREA: 1.0000 "application " | TOPICAL_CREAM | Freq: Two times a day (BID) | CUTANEOUS | 0 refills | Status: DC

## 2020-02-24 MED ORDER — AZITHROMYCIN 250 MG PO TABS: 250.0000 mg | ORAL_TABLET | Freq: Every day | ORAL | 0 refills | Status: DC

## 2020-02-24 MED ORDER — BENZONATATE 100 MG PO CAPS: 100.0000 mg | ORAL_CAPSULE | Freq: Three times a day (TID) | ORAL | 0 refills | Status: DC

## 2020-02-24 NOTE — ED Triage Notes (Signed)
Pt presents with complaints of sore throat, cough, body aches, congestion and sneezing x 1 week.  Pt states she took benadryl that she thinks interfered with one of her medicines she takes and caused her to get a rash. Reports having cold chills last night. Pt denies any other symptoms.

## 2020-02-24 NOTE — Discharge Instructions (Addendum)
COVID testing ordered.  It will take between 2-7 days for test results.  Someone will contact you regarding abnormal results.    In the meantime: You should remain isolated in your home for 10 days from symptom onset AND greater than 24 hours after symptoms resolution (absence of fever without the use of fever-reducing medication and improvement in respiratory symptoms), whichever is longer Get plenty of rest and push fluid Tessalon Perles prescribed for cough Flonase was prescribed for short-term use  Azithromycin was prescribed Flonase prescribed for nasal congestion and runny nose Use medications daily for symptom relief Use OTC medications like ibuprofen or tylenol as needed fever or pain Call or go to the ED if you have any new or worsening symptoms such as fever, worsening cough, shortness of breath, chest tightness, chest pain, turning blue, changes in mental status, etc..Marland Kitchen
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
## 2020-02-25 LAB — SARS-COV-2, NAA 2 DAY TAT

## 2020-02-25 LAB — NOVEL CORONAVIRUS, NAA: SARS-CoV-2, NAA: NOT DETECTED

## 2020-03-01 ENCOUNTER — Other Ambulatory Visit: Payer: Self-pay

## 2020-03-01 ENCOUNTER — Ambulatory Visit (INDEPENDENT_AMBULATORY_CARE_PROVIDER_SITE_OTHER): Payer: 59

## 2020-03-01 VITALS — BP 120/83 | HR 88 | Temp 97.8°F

## 2020-03-01 DIAGNOSIS — Z9889 Other specified postprocedural states: Secondary | ICD-10-CM | POA: Diagnosis not present

## 2020-03-01 DIAGNOSIS — Z9013 Acquired absence of bilateral breasts and nipples: Secondary | ICD-10-CM | POA: Diagnosis not present

## 2020-03-01 MED ORDER — LIDOCAINE-PRILOCAINE 2.5-2.5 % EX CREA: 1.0000 "application " | TOPICAL_CREAM | CUTANEOUS | 0 refills | Status: DC | PRN

## 2020-03-01 NOTE — Progress Notes (Signed)
NIPPLE AREOLAR TATTOO PROCEDURE  PREOPERATIVE DIAGNOSIS:  Acquired absence of BILATERAL nipple areolar   POSTOPERATIVE DIAGNOSIS: Acquired absence of BILATERAL nipple areolar    PROCEDURES: BILATERAL nipple areolar tattoo   ATTENDING SURGEON: Dr. Lyndee Leo Sanger   ANESTHESIA:  EMLA  COMPLICATIONS: None.  JUSTIFICATION FOR PROCEDURE:  Ashley [name] is a 44 y.o. female with a history of breast cancer status post bilateral breast reconstruction. The patient presents for bilateral nipple areolar complex tattoo. Risks, benefits, indications, and alternatives of the above described procedures were discussed with the patient and all the patient's questions were answered.   DESCRIPTION OF PROCEDURE: After informed consent was obtained  Pre-procedure photos were obtained and entered into chart   A time out was performed to confirm patient's identity and surgical site.  Pt was placed in supine position and the area was prepped and draped in the usual sterile fashion. Measurements/shape & placement were chosen  by the patient for the NAC colors/shades were also chosen.   Using a  #7 and a #9  tattoo head, pigment was instilled to the designed nipple areolar complex.  Once adequate pigment had been applied to the nipple areolar complex- post procedure photos were obtained & then vaseline and gauze dressing was applied. The patient tolerated the procedure well. Post- procedure instructions were reviewed & given to pt. She is reminded to call for any concerns- otherwise f/u in 6 weeks for touch-up if needed. Tattoo ink used: World Famous Renold Genta lot# [id number] 11/12/2022 Josph Macho Skin lot# [id number] 07/19/2023 Antique Gold lot# [id number] 02/24/2023 Cool Honey lot#[id number]/exp 09/02/2022 Fair Peach lot# [id number] 11/12/2022 Dark Mink lot# [id number] exp 11/12/2022 Duration- topical anesthesia liquid was used as needed. Sterile eyewash solution was used  as needed for dilution/mix of colors Post- procedure instructions reviewed & given to pf/u in 6 weeks if needed

## 2020-03-09 IMAGING — MG MM PLC BREAST LOC DEV 1ST LESION INC*R*
3 series · 3 of 3 positions shown · non-contrast
Comparison: Previous exam(s).

CLINICAL DATA: Biopsy proven intermediate-grade DCIS involving a
papilloma in the right breast.

EXAM:
MAMMOGRAPHIC GUIDED RADIOACTIVE SEED LOCALIZATION OF THE RIGHT
BREAST

[R CC (1 of 2)]
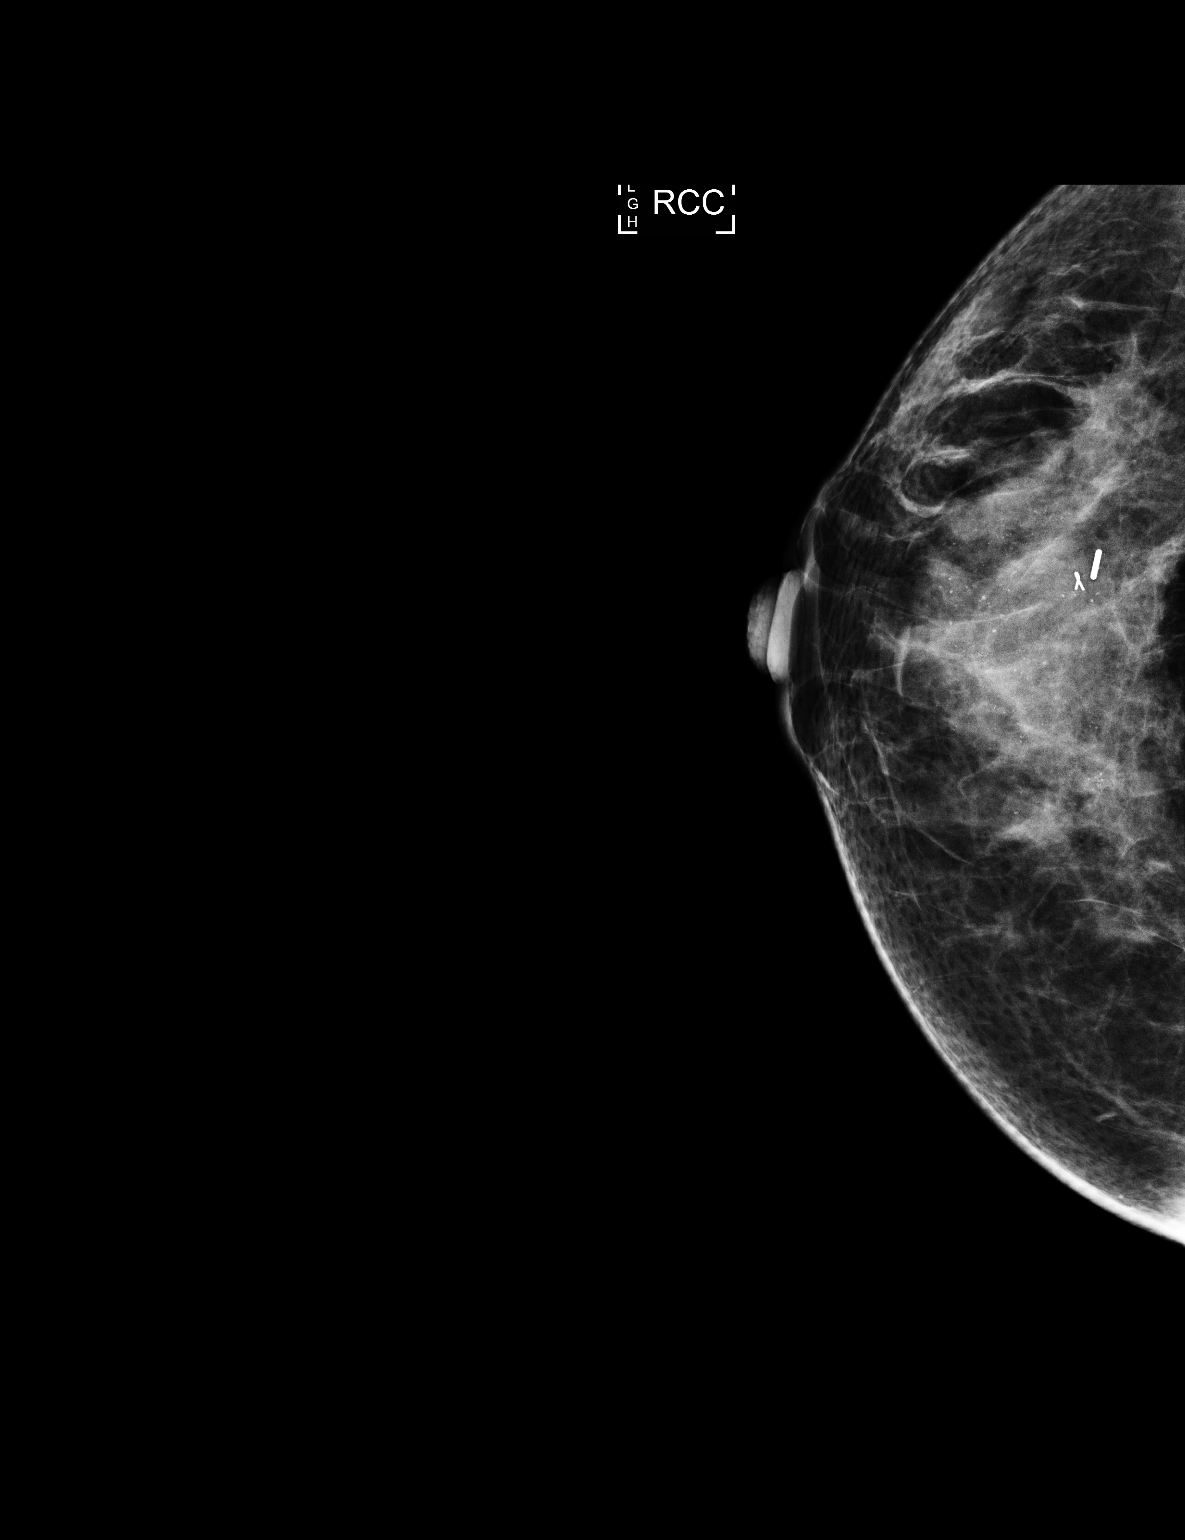

[R ML]
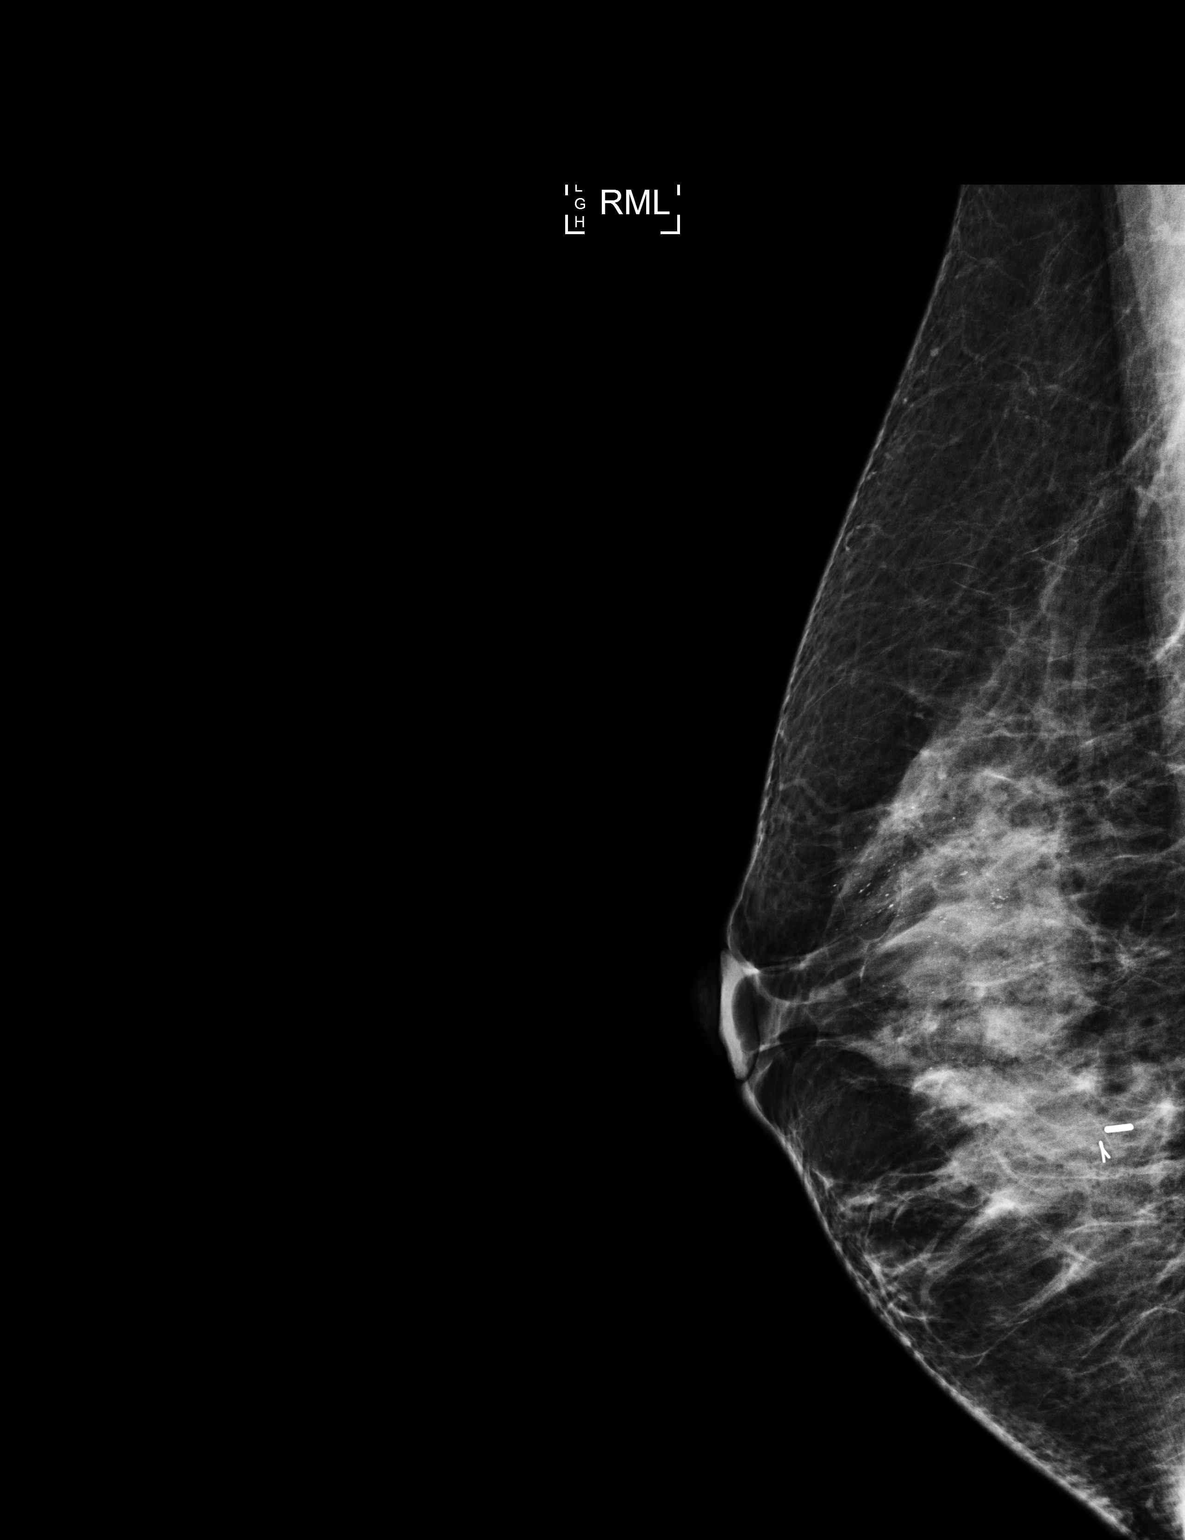

[R CC (2 of 2)]
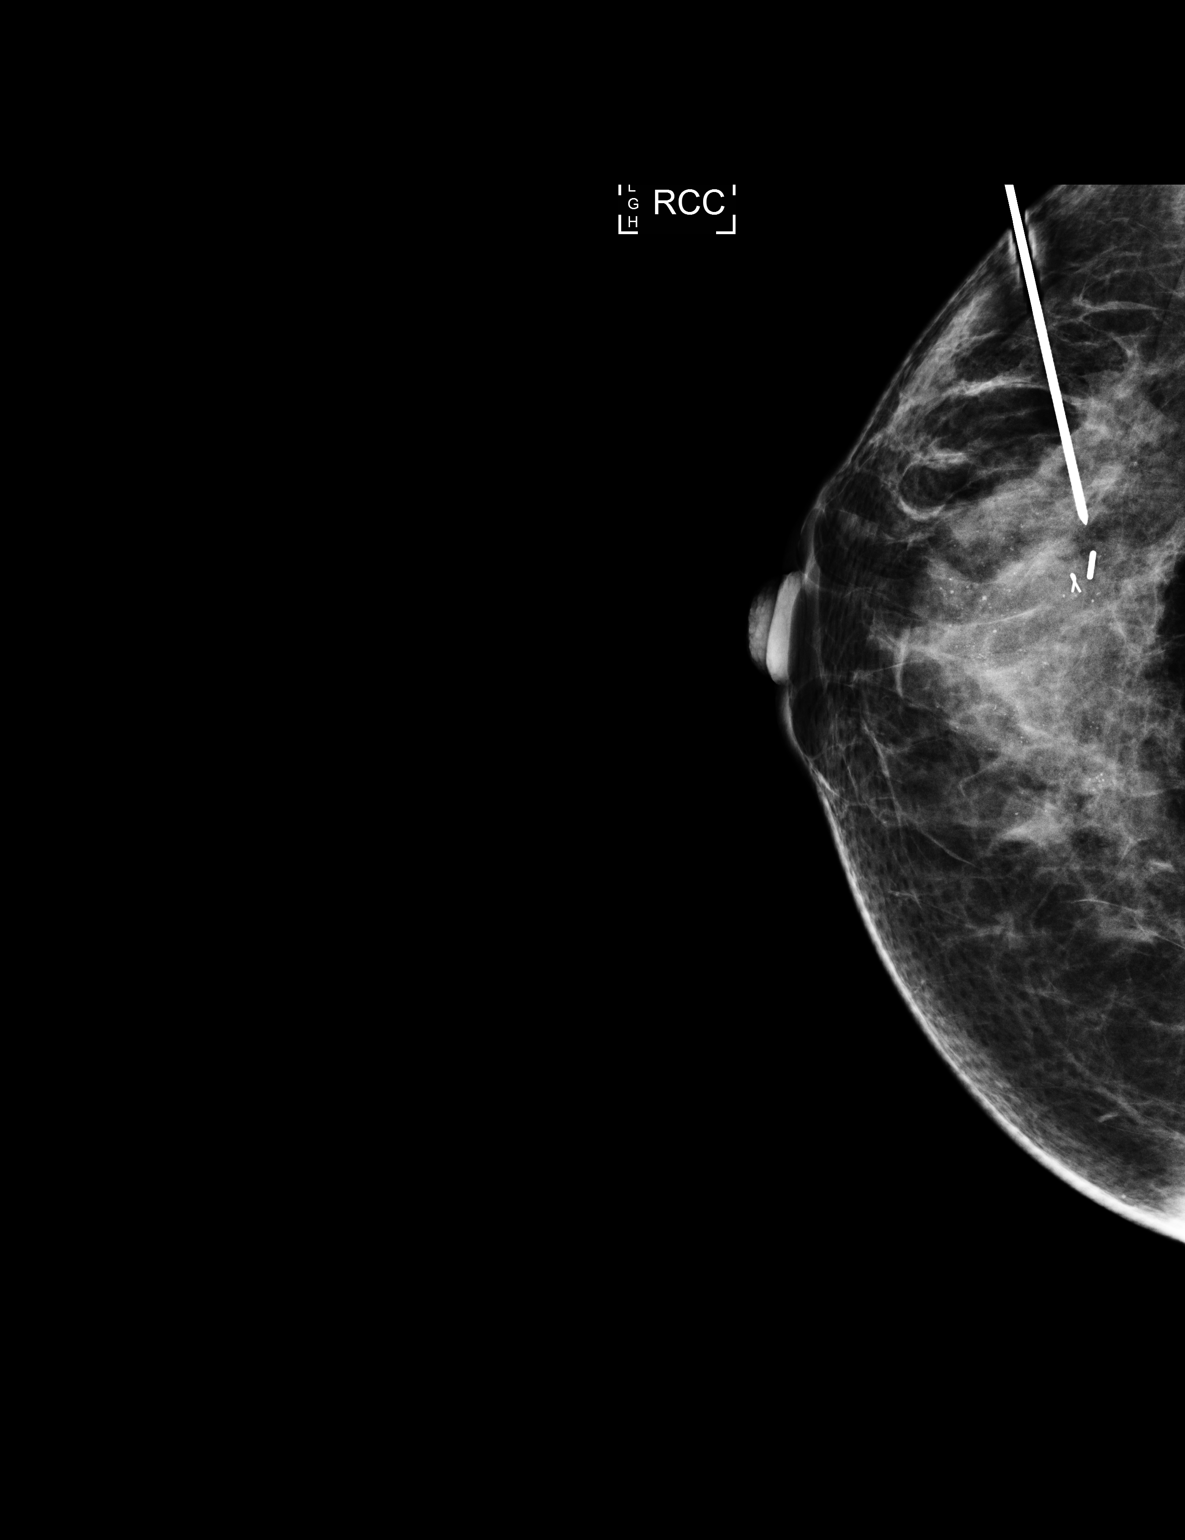

[3 of 3 positions shown; findings below may reference images not displayed]



The usual time-out protocol was performed immediately prior to the
procedure.

Using mammographic guidance, sterile technique, 1% lidocaine and an
R-LTA radioactive seed, the ribbon shaped clip was localized using a
lateral to medial approach. The follow-up mammogram images confirm
the seed in the expected location and were marked for Dr. Sajanas.

Follow-up survey of the patient confirms presence of the radioactive
seed.

Order number of R-LTA seed:  [id number].

Total activity:  0.235 millicuries reference Date: 02/12/2018

The patient tolerated the procedure well and was released from the
[REDACTED]. She was given instructions regarding seed removal.
IMPRESSION: Radioactive seed localization right breast. No apparent
complications.

## 2020-03-11 ENCOUNTER — Other Ambulatory Visit: Payer: Self-pay

## 2020-03-11 ENCOUNTER — Encounter: Payer: Self-pay | Admitting: Family Medicine

## 2020-03-11 ENCOUNTER — Ambulatory Visit (INDEPENDENT_AMBULATORY_CARE_PROVIDER_SITE_OTHER): Payer: 59 | Admitting: Family Medicine

## 2020-03-11 VITALS — BP 122/70 | HR 80 | Temp 96.8°F | Resp 14 | Ht 64.0 in | Wt 131.0 lb

## 2020-03-11 DIAGNOSIS — N760 Acute vaginitis: Secondary | ICD-10-CM

## 2020-03-11 DIAGNOSIS — Z0000 Encounter for general adult medical examination without abnormal findings: Secondary | ICD-10-CM | POA: Diagnosis not present

## 2020-03-11 DIAGNOSIS — D0511 Intraductal carcinoma in situ of right breast: Secondary | ICD-10-CM

## 2020-03-11 DIAGNOSIS — Z124 Encounter for screening for malignant neoplasm of cervix: Secondary | ICD-10-CM

## 2020-03-11 DIAGNOSIS — Z853 Personal history of malignant neoplasm of breast: Secondary | ICD-10-CM

## 2020-03-11 LAB — COMPLETE METABOLIC PANEL WITH GFR
AG Ratio: 1.8 (calc) (ref 1.0–2.5)
ALT: 11 U/L (ref 6–29)
AST: 12 U/L (ref 10–30)
Albumin: 4.1 g/dL (ref 3.6–5.1)
Alkaline phosphatase (APISO): 42 U/L (ref 31–125)
BUN: 10 mg/dL (ref 7–25)
CO2: 26 mmol/L (ref 20–32)
Calcium: 8.8 mg/dL (ref 8.6–10.2)
Chloride: 105 mmol/L (ref 98–110)
Creat: 0.69 mg/dL (ref 0.50–1.10)
GFR, Est African American: 124 mL/min/{1.73_m2} (ref >=60)
GFR, Est Non African American: 107 mL/min/{1.73_m2} (ref >=60)
Globulin: 2.3 g/dL (calc) (ref 1.9–3.7)
Glucose, Bld: 75 mg/dL (ref 65–99)
Potassium: 4.3 mmol/L (ref 3.5–5.3)
Sodium: 137 mmol/L (ref 135–146)
Total Bilirubin: 0.6 mg/dL (ref 0.2–1.2)
Total Protein: 6.4 g/dL (ref 6.1–8.1)

## 2020-03-11 LAB — CBC WITH DIFFERENTIAL/PLATELET
Absolute Monocytes: 403 cells/uL (ref 200–950)
Basophils Absolute: 59 cells/uL (ref 0–200)
Basophils Relative: 0.9 %
Eosinophils Absolute: 79 cells/uL (ref 15–500)
Eosinophils Relative: 1.2 %
HCT: 36.7 % (ref 35.0–45.0)
Hemoglobin: 12 g/dL (ref 11.7–15.5)
Lymphs Abs: 1228 cells/uL (ref 850–3900)
MCH: 31.3 pg (ref 27.0–33.0)
MCHC: 32.7 g/dL (ref 32.0–36.0)
MCV: 95.6 fL (ref 80.0–100.0)
MPV: 13 fL — ABNORMAL HIGH (ref 7.5–12.5)
Monocytes Relative: 6.1 %
Neutro Abs: 4831 cells/uL (ref 1500–7800)
Neutrophils Relative %: 73.2 %
Platelets: 178 10*3/uL (ref 140–400)
RBC: 3.84 10*6/uL (ref 3.80–5.10)
RDW: 11.9 % (ref 11.0–15.0)
Total Lymphocyte: 18.6 %
WBC: 6.6 10*3/uL (ref 3.8–10.8)

## 2020-03-11 LAB — LIPID PANEL
Cholesterol: 169 mg/dL (ref <200)
HDL: 51 mg/dL (ref >=50)
LDL Cholesterol (Calc): 102 mg/dL (calc) — ABNORMAL HIGH
Non-HDL Cholesterol (Calc): 118 mg/dL (calc) (ref <130)
Total CHOL/HDL Ratio: 3.3 (calc) (ref <5.0)
Triglycerides: 71 mg/dL (ref <150)

## 2020-03-11 LAB — WET PREP FOR TRICH, YEAST, CLUE

## 2020-03-11 MED ORDER — METRONIDAZOLE 500 MG PO TABS: 500.0000 mg | ORAL_TABLET | Freq: Two times a day (BID) | ORAL | 0 refills | Status: DC

## 2020-03-11 MED ORDER — CLINDAMYCIN PHOS-BENZOYL PEROX 1-5 % EX GEL: Freq: Two times a day (BID) | CUTANEOUS | 0 refills | Status: DC
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
## 2020-03-11 NOTE — Addendum Note (Signed)
Addended by: Sheral Flow on: 03/11/2020 10:17 AM   Modules accepted: Orders

## 2020-03-12 ENCOUNTER — Telehealth: Payer: Self-pay

## 2020-03-12 LAB — PAP IG W/ RFLX HPV ASCU

## 2020-03-12 NOTE — Telephone Encounter (Signed)
returned pt's call re: after care for nipple tattoo- she reports that she has had minimal "flaking" of skin from areola area, she denies any drainage,pain or other complications She did state that the pigment has faded significantly- but she can still see most of the tattoo. She is requesting to d/c the vaseline and gauze at this time I instructed her that she can use the covering as needed for comfort or if she has any increased irritation. She understands to call for any concerns- & she will try to attempt to send photos via "my chart" Otherwise we will see her in office for her 6 weeks f/u & touch-up tattoo

## 2020-03-15 ENCOUNTER — Telehealth: Payer: Self-pay | Admitting: Family Medicine

## 2020-03-15 NOTE — Telephone Encounter (Signed)
Patient would like a call with her lab results.  CB# [phone]

## 2020-03-16 NOTE — Telephone Encounter (Signed)
Pt aware of results 

## 2020-04-05 ENCOUNTER — Other Ambulatory Visit: Payer: Self-pay

## 2020-04-05 ENCOUNTER — Ambulatory Visit (INDEPENDENT_AMBULATORY_CARE_PROVIDER_SITE_OTHER): Payer: 59

## 2020-04-05 ENCOUNTER — Encounter: Payer: Self-pay | Admitting: Plastic Surgery

## 2020-04-05 VITALS — BP 118/78 | HR 67 | Temp 97.8°F | Ht 64.0 in | Wt 131.0 lb

## 2020-04-05 DIAGNOSIS — Z9889 Other specified postprocedural states: Secondary | ICD-10-CM

## 2020-04-05 NOTE — Patient Instructions (Signed)
Vaseline/gauze to area - until peeling/sloughing is complete Call for any concerns F/u in approx. 6 weeks for touch-up if needed Pt is going on vacation in June- she requests f/u in july

## 2020-04-05 NOTE — Progress Notes (Signed)
NIPPLE AREOLAR TATTOO PROCEDURE  PREOPERATIVE DIAGNOSIS:  Acquired absence of BILATERALnipple areolar   POSTOPERATIVE DIAGNOSIS: Acquired absence of BILATERAL nipple areolar    PROCEDURES: BILATERALnipple areolar tattoo   ATTENDING SURGEON: Dr. Lyndee Leo Sanger/[place]  ANESTHESIA:  EMLA  COMPLICATIONS: None.  JUSTIFICATION FOR PROCEDURE:  Ashley [name] is a 44 y.o. female with a history of breast cancer status post breast reconstruction. The patient presents for bilateral nipple areolar complex tattoo. Risks, benefits, indications, and alternatives of the above described procedures were discussed with the patient and all the patient's questions were answered.  Pre-procedure photos obtained & entered into chart  DESCRIPTION OF PROCEDURE: After informed consent was obtained and proper identification of patient and surgical site was made, the patient was taken to the procedure room and placed supine on the operating room table.  A time out was performed to confirm patient's identity and surgical site. Areola placement/measurements & shape were finalized with pt's approval. Color/shades were chosen/approved by pt areola complex & nipple tattoo. The pt was placed in supine position. EMLA cream was removed & area prepped and draped in the usual sterile fashion.  Using a (7) round & (9) straight/medium tattoo head on the [employer]/Digital Pop Deluxe tattoo handpiece/machine pigment was instilled to the designed nipple areolar complex, which was confirmed preoperatively with the patient.  Once adequate pigment had been applied to the nipple areolar complex- Post-procedure photos were obtained/enetered into chart  vaseline & gauze dressing was applied.  The patient tolerated the procedure well.   Pigment used: World Famous Tattoo Ink Colors: Chipper Oman lot# [id number] exp. 11/12/2022 Josph Macho Skin lot# [id number] exp 07/19/2023 Emilia Beck lot# [id number] exp 02/24/2023 Cool Honey  lot# [id number] exp 09/02/2022 Fair Peach lot# [id number] exp 11/12/2022 Dark Mink lot# [id number] exp 11/12/2022 Sterile eye wash solution was used to dilute/mix colors as needed Duration- topical anesthesia liquid was used as needed during procedure Post op instructions were reviewed & given to pt Pt will call for any concerns F/u in 6 weeks if needed for touch-up

## 2020-04-06 ENCOUNTER — Telehealth: Payer: Self-pay

## 2020-04-06 ENCOUNTER — Encounter: Payer: Self-pay | Admitting: Plastic Surgery

## 2020-04-06 NOTE — Telephone Encounter (Signed)
Patient called to advise that some of the ink on her tattoo is coming off and she wanted to know if that is normal or not. Advised I would have Doroteo Bradford give her a call when she has a chance between patients today. Callback @ [phone]

## 2020-04-07 ENCOUNTER — Encounter: Payer: Self-pay | Admitting: Plastic Surgery

## 2020-04-07 MED ORDER — CLINDAMYCIN HCL 300 MG PO CAPS
300.0000 mg | ORAL_CAPSULE | Freq: Three times a day (TID) | ORAL | 0 refills | Status: AC
Start: 2020-04-07 — End: 2020-04-10

## 2020-04-08 ENCOUNTER — Encounter: Payer: Self-pay | Admitting: Surgical

## 2020-04-08 ENCOUNTER — Ambulatory Visit (INDEPENDENT_AMBULATORY_CARE_PROVIDER_SITE_OTHER): Payer: 59 | Admitting: Surgical

## 2020-04-08 ENCOUNTER — Other Ambulatory Visit: Payer: Self-pay

## 2020-04-08 ENCOUNTER — Encounter: Payer: Self-pay | Admitting: Plastic Surgery

## 2020-04-08 VITALS — BP 147/79 | HR 78 | Temp 97.5°F | Ht 64.0 in | Wt 131.0 lb

## 2020-04-08 DIAGNOSIS — Z853 Personal history of malignant neoplasm of breast: Secondary | ICD-10-CM

## 2020-04-08 DIAGNOSIS — Z9013 Acquired absence of bilateral breasts and nipples: Secondary | ICD-10-CM | POA: Diagnosis not present

## 2020-04-08 DIAGNOSIS — Z9889 Other specified postprocedural states: Secondary | ICD-10-CM | POA: Diagnosis not present

## 2020-04-08 NOTE — Telephone Encounter (Signed)
Call back to pt re: her concern with (R) nipple/areola - post NAC tattoo touch-up on 04/05/20 Pt reports redness & increased tenderness (R) nipple- no drainage but c/o tissue is "dry/hot" & ink is sloughing off No fever or chills & no n/v Pt is concerned with possible infection Pt sent photos via "mychart" I consulted with Dr. Marla Roe about her concerns & she assessed photos & suggested that pt come into office on Thurs.@ 12pm to see Matt,PA & Dr. Marla Roe called pt to discuss her symptoms  She sent RX for antibiotic & pt agrees to come for f/u this Thurs.

## 2020-04-08 NOTE — Progress Notes (Signed)
Patient is a 44 year old female here for follow-up after bilateral nipple areola tattooing on 04/05/2020 with [place], RN.  Patient is here for evaluation of bilateral NAC after noticing some changes to NAC tattoo and some redness.  Patient underwent mastectomies in May 2019 and secondary reconstruction in October 2019.  Patient does not have any fevers, nausea, vomiting, she does endorse feeling somewhat cold sometimes but no overt chills.  Doroteo Bradford Cox present with me during today's evaluation and on exam.  Well-developed well-nourished white female, no acute distress. Normocephalic, atraumatic Pulmonary: Unlabored, symmetric rise and fall Skin: Normal temperature  Bilateral breast incisions well-healed, bilateral NAC tattoos in place, healing with epithelial changes due to recent tattooing.  There is no sign of any infection.  There is some peritattoo irritation -more right than left., no drainage, fluctuance or purulence noted bilaterally.   Plan: No sign of any infection, seroma, hematoma.  Some peri-NAC tattoo irritation noted due to recent tattooing on 04/05/2020.  Patient is feeling well, I reassured her that I do not believe she has any infection from recent tattooing or other sources.  She has well-healed incisions from reconstruction and tattoos are healing nicely as well.  Please call us with any questions or concerns.  Please call if you develop any fevers, chills, nausea, vomiting.  Pictures were obtained of the patient and placed in the chart with the patient's or guardian's permission.

## 2020-04-15 ENCOUNTER — Encounter: Payer: Self-pay | Admitting: Plastic Surgery

## 2020-04-28 ENCOUNTER — Telehealth: Payer: Self-pay

## 2020-04-28 NOTE — Telephone Encounter (Signed)
Call to pt -no answer- unable to leave v/m

## 2020-04-30 ENCOUNTER — Telehealth: Payer: Self-pay

## 2020-04-30 NOTE — Telephone Encounter (Signed)
Call back from pt re: her status with bilateral nipple/areola complex tattoo. she reports that there is no longer any redness/drainage/swelling or tenderness she reports that she has minimal peeling & is overall pleased with the color/size & appearance. She has a f/u appointment in June but she may have to reschedule that-   will f/u when possible She is reminded to call for any concerns

## 2020-05-11 ENCOUNTER — Ambulatory Visit
Admission: EM | Admit: 2020-05-11 | Discharge: 2020-05-11 | Disposition: A | Discharge status: Home / Self Care | Payer: 59 | Attending: Emergency Medicine | Admitting: Emergency Medicine

## 2020-05-11 ENCOUNTER — Other Ambulatory Visit: Payer: Self-pay

## 2020-05-11 DIAGNOSIS — J0101 Acute recurrent maxillary sinusitis: Secondary | ICD-10-CM | POA: Diagnosis not present

## 2020-05-11 DIAGNOSIS — R110 Nausea: Secondary | ICD-10-CM

## 2020-05-11 MED ORDER — CEFDINIR 300 MG PO CAPS
300.0000 mg | ORAL_CAPSULE | Freq: Two times a day (BID) | ORAL | 0 refills | Status: AC
Start: 2020-05-11 — End: 2020-05-21

## 2020-05-11 MED ORDER — PROMETHAZINE HCL 25 MG PO TABS
25.0000 mg | ORAL_TABLET | Freq: Three times a day (TID) | ORAL | 0 refills | Status: DC | PRN
Start: 2020-05-11 — End: 2021-01-31
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
## 2020-05-11 NOTE — Discharge Instructions (Signed)
Get plenty of rest and push fluids Cefdinir for sinus infection phenegan for nausea  Use OTC medications like ibuprofen or tylenol as needed fever or pain Follow up with PCP Call or go to the ED if you have any new or worsening symptoms such as fever, cough, shortness of breath, chest tightness, chest pain, turning blue, changes in mental status, etc..Marland Kitchen

## 2020-05-11 NOTE — ED Triage Notes (Signed)
Pt c/o right sided facial pain, sore throat

## 2020-06-09 ENCOUNTER — Encounter: Payer: Self-pay | Admitting: Family Medicine

## 2020-06-14 ENCOUNTER — Ambulatory Visit
Admission: EM | Admit: 2020-06-14 | Discharge: 2020-06-14 | Disposition: A | Discharge status: Home / Self Care | Payer: 59 | Attending: Emergency Medicine | Admitting: Emergency Medicine

## 2020-06-14 ENCOUNTER — Other Ambulatory Visit: Payer: Self-pay

## 2020-06-14 ENCOUNTER — Encounter: Payer: Self-pay | Admitting: Emergency Medicine

## 2020-06-14 DIAGNOSIS — J029 Acute pharyngitis, unspecified: Secondary | ICD-10-CM

## 2020-06-14 DIAGNOSIS — J069 Acute upper respiratory infection, unspecified: Secondary | ICD-10-CM | POA: Diagnosis not present

## 2020-06-14 LAB — POCT RAPID STREP A (OFFICE): Rapid Strep A Screen: NEGATIVE

## 2020-06-14 MED ORDER — CETIRIZINE HCL 10 MG PO TABS: 10.0000 mg | ORAL_TABLET | Freq: Every day | ORAL | 0 refills | Status: DC
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
## 2020-06-14 NOTE — ED Triage Notes (Signed)
Sore throat and sinus congestion x1 week

## 2020-06-14 NOTE — Discharge Instructions (Signed)
Strep test negative, will send out for culture and we will call you with results Get plenty of rest and push fluids Continue to take Tessalon Perle for cough and Cepacol lozenges for sore throat Zyrtec D prescribed. Use daily for symptomatic relief  Drink warm or cool liquids, use throat lozenges, or popsicles to help alleviate symptoms Take OTC ibuprofen or tylenol as needed for pain Follow up with PCP if symptoms persists Return or go to ER if patient has any new or worsening symptoms such as fever, chills, nausea, vomiting, worsening sore throat, cough, abdominal pain, chest pain, changes in bowel or bladder habits, etc..Marland Kitchen

## 2020-06-17 LAB — CULTURE, GROUP A STREP (THRC)

## 2020-06-21 ENCOUNTER — Ambulatory Visit: Payer: 59

## 2020-06-24 ENCOUNTER — Ambulatory Visit: Payer: 59

## 2020-06-24 ENCOUNTER — Encounter: Payer: Self-pay | Admitting: Plastic Surgery

## 2020-06-24 ENCOUNTER — Ambulatory Visit (INDEPENDENT_AMBULATORY_CARE_PROVIDER_SITE_OTHER): Payer: 59

## 2020-06-24 ENCOUNTER — Other Ambulatory Visit: Payer: Self-pay

## 2020-06-24 VITALS — BP 113/75 | Ht 65.0 in | Wt 131.0 lb

## 2020-06-24 DIAGNOSIS — Z9013 Acquired absence of bilateral breasts and nipples: Secondary | ICD-10-CM

## 2020-08-02 ENCOUNTER — Telehealth: Payer: Self-pay | Admitting: Family Medicine

## 2020-08-02 NOTE — Telephone Encounter (Signed)
Patient would like a referral to [facility] for external hemorrhoids. She was seen at an urgent care this weekend.  CB# [phone]

## 2020-08-04 DIAGNOSIS — U071 COVID-19: Secondary | ICD-10-CM

## 2020-08-04 HISTORY — DX: COVID-19: U07.1

## 2020-08-04 NOTE — Telephone Encounter (Signed)
Pt has appt with PCP to discuss referral for GI

## 2020-08-06 ENCOUNTER — Ambulatory Visit: Payer: 59 | Admitting: Family Medicine

## 2020-08-06 ENCOUNTER — Other Ambulatory Visit: Payer: Self-pay

## 2020-08-06 VITALS — BP 120/80 | HR 78 | Temp 97.9°F | Ht 64.0 in | Wt 134.0 lb

## 2020-08-06 DIAGNOSIS — K625 Hemorrhage of anus and rectum: Secondary | ICD-10-CM | POA: Diagnosis not present

## 2020-08-06 NOTE — Progress Notes (Signed)
Patient ID: Ashley [name], female    DOB: [birth year], 44 y.o.   MRN: [id number]  PCP: Susy Frizzle, MD  Chief Complaint  Patient presents with  . GI Problem    Subjective:   Patient is a very pleasant 44 year old Caucasian female who presents today with a history of bright red blood per rectum.  She states that 1 week ago, she had a very large bowel movement that was painful when she defecated.  It was abnormally large.  Shortly thereafter, she saw blood on the floor around the toilet when she stood up.  Therefore she was experiencing painless bright red blood per rectum.  Later that afternoon she went to a gym.  When she went to the bathroom to urinate, she again saw blood on the floor when she stood up.  She has not seen any additional large quantity of bright red blood although there has been some slight pink blood on the toilet tissue when she wipes.  There has been no blood mixed in with the stool.  There has been no maroon-colored stool.  She denies any chest pain shortness of breath or syncope.  She denies any nausea or vomiting. Patient Active Problem List   Diagnosis Date Noted  . Status post bilateral breast reconstruction 09/25/2018  . History of breast cancer in female 09/25/2018  . Acquired absence of breast and absent nipple, bilateral 09/03/2018  . Breast cancer ([place]) 04/30/2018  . Malignant neoplasm of overlapping sites of right female breast ([place]) 04/05/2018  . Genetic testing 03/22/2018  . Family history of breast cancer   . 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 Migraine headache 09/01/2013  . Microscopic hematuria 09/01/2013  . DERMATITIS, SEBORRHEIC NOS 06/21/2007  . MASS, SUPERFICIAL 06/21/2007  . DYSURIA 06/04/2007  . Pap smear of cervix with ASCUS, cannot exclude HGSIL 05/28/2007  . HEADACHE 04/24/2007  . SINUSITIS 03/29/2007  . IRREGULAR MENSES 03/29/2007  . Allergic rhinitis, cause unspecified 01/31/2007  . CONSTIPATION  01/31/2007  . ACNE 01/31/2007  . DERMATITIS NOS 01/31/2007  . BACK PAIN, LOW 01/31/2007  . HIGH RISK PATIENT 01/31/2007     Prior to Admission medications   Medication Sig Start Date End Date Taking? Authorizing Provider  acetaminophen (TYLENOL) 500 MG tablet Take 500 mg by mouth every 6 (six) hours as needed.   Yes [provider]  Clobetasol Prop Emollient Base 0.05 % emollient cream Apply 1 application topically 2 (two) times daily. 12/13/18  Yes Dillingham, Loel Lofty, DO  ibuprofen (ADVIL,MOTRIN) 800 MG tablet Take 1 tablet (800 mg total) by mouth every 8 (eight) hours as needed. 10/03/18  Yes Dillingham, Loel Lofty, DO  ipratropium (ATROVENT) 0.03 % nasal spray Place 2 sprays into both nostrils every 12 (twelve) hours. 03/22/19  Yes Brunetta Jeans, PA-C  Levonorgestrel [facility]) 19.5 MG IUD 1 each by Intrauterine route once.  03/21/18  Yes [provider]  omeprazole (PRILOSEC) 40 MG capsule Take 1 capsule (40 mg total) by mouth daily. 08/28/18  Yes Susy Frizzle, MD  promethazine (PHENERGAN) 25 MG tablet Take 1 tablet (25 mg total) by mouth every 6 (six) hours as needed for nausea or vomiting. 02/26/18  Yes Coralie Keens, MD  propranolol (INDERAL) 10 MG tablet Take 1 tablet (10 mg total) at bedtime by mouth. Patient taking differently: Take 10 mg by mouth at bedtime.  10/12/17  Yes Palmer, Modena Nunnery, MD  rizatriptan (MAXALT) 5 MG tablet Take 1  tablet (5 mg total) by mouth as needed for migraine. May repeat in 2 hours if needed Patient taking differently: Take 10 mg by mouth every 2 (two) hours as needed for migraine (may repeat in 2 hours if needed).  05/29/17  Yes [place], Modena Nunnery, MD  cefdinir (OMNICEF) 300 MG capsule Take 1 capsule (300 mg total) by mouth 2 (two) times daily. Patient not taking: Reported on 04/23/2019 03/22/19   Brunetta Jeans, PA-C     Allergies  Allergen Reactions  . Prednisone Palpitations  . Doxycycline Nausea And Vomiting  . Aspirin  Hives  . Biaxin [Clarithromycin] Other (See Comments)    Induced migraine  . Ciprofloxacin Rash  . Diphenhydramine Hcl Palpitations and Other (See Comments)    *doesn't like the way the injection makes her feel*  . Latex Itching and Rash  . Levaquin [Levofloxacin In D5w] Hives  . Penicillins Hives and Other (See Comments)    Has patient had a PCN reaction causing immediate rash, facial/tongue/throat swelling, SOB or lightheadedness with hypotension: No Has patient had a PCN reaction causing severe rash involving mucus membranes or skin necrosis: No Has patient had a PCN reaction that required hospitalization: No Has patient had a PCN reaction occurring within the last 10 years: No If all of the above answers are "NO", then may proceed with Cephalosporin use.   . Vicodin [Hydrocodone-Acetaminophen] Nausea And Vomiting     Family History  Problem Relation Age of Onset  . Hypertension Father   . Diabetes Father   . Hypertension Mother   . Breast cancer Maternal Grandmother        dx over 53, d. 70-80s  . Lung cancer Maternal Aunt   . Cancer Maternal Uncle        unknown  . Heart disease Paternal Aunt   . Heart disease Paternal Grandfather      Social History   Socioeconomic History  . Marital status: Single    Spouse name: Not on file  . Number of children: 2  . Years of education: Not on file  . Highest education level: Not on file  Occupational History  . Not on file  Tobacco Use  . Smoking status: Former Smoker    Quit date: 03/06/2004    Years since quitting: 16.4  . Smokeless tobacco: Never Used  Vaping Use  . Vaping Use: Never used  Substance and Sexual Activity  . Alcohol use: No  . Drug use: No  . Sexual activity: Yes    Birth control/protection: I.U.D.  Other Topics Concern  . Not on file  Social History Narrative  . Not on file   Social Determinants of Health   Financial Resource Strain:   . Difficulty of Paying Living Expenses: Not on file  Food  Insecurity:   . Worried About Charity fundraiser in the Last Year: Not on file  . Ran Out of Food in the Last Year: Not on file  Transportation Needs:   . Lack of Transportation (Medical): Not on file  . Lack of Transportation (Non-Medical): Not on file  Physical Activity:   . Days of Exercise per Week: Not on file  . Minutes of Exercise per Session: Not on file  Stress:   . Feeling of Stress : Not on file  Social Connections:   . Frequency of Communication with Friends and Family: Not on file  . Frequency of Social Gatherings with Friends and Family: Not on file  . Attends Religious  Services: Not on file  . Active Member of Clubs or Organizations: Not on file  . Attends Archivist Meetings: Not on file  . Marital Status: Not on file  Intimate Partner Violence:   . Fear of Current or Ex-Partner: Not on file  . Emotionally Abused: Not on file  . Physically Abused: Not on file  . Sexually Abused: Not on file     Review of Systems  Constitutional: Negative.  Negative for fever.  HENT: Negative.   Eyes: Negative.   Respiratory: Negative.  Negative for cough.   Cardiovascular: Negative.  Negative for chest pain.  Gastrointestinal: Positive for hematochezia, hemorrhoids and rectal pain. Negative for abdominal pain, anorexia, diarrhea, hematemesis, nausea and vomiting.  Endocrine: Negative.   Genitourinary: Negative.  Negative for hematuria, vaginal bleeding and vaginal discharge.  Musculoskeletal: Negative.   Skin: Negative.  Negative for rash.  Allergic/Immunologic: Negative.   Neurological: Negative.  Negative for headaches.  Hematological: Negative.   Psychiatric/Behavioral: Negative.   All other systems reviewed and are negative.      Objective:    Vitals:   08/06/20 1521  BP: 120/80  Pulse: 78  Temp: 97.9 F (36.6 C)  TempSrc: Temporal  SpO2: 99%  Weight: 134 lb (60.8 kg)  Height: 5\' 4"  (1.626 m)      Physical Exam Vitals reviewed. Exam  conducted with a chaperone present.  Constitutional:      General: She is not in acute distress.    Appearance: Normal appearance. She is well-developed. She is not ill-appearing, toxic-appearing or diaphoretic.     Comments: Very anxious appearing female otherwise well-appearing, nontoxic, alert  HENT:     Head: Normocephalic and atraumatic.     Mouth/Throat:     Pharynx: Uvula midline.  Eyes:     General: Lids are normal.     Conjunctiva/sclera: Conjunctivae normal.     Pupils: Pupils are equal, round, and reactive to light.     Comments: No conjunctival pallor  Neck:     Trachea: Phonation normal. No tracheal deviation.  Cardiovascular:     Rate and Rhythm: Normal rate and regular rhythm.     Pulses: Normal pulses.     Heart sounds: Normal heart sounds. No murmur heard.  No friction rub. No gallop.   Pulmonary:     Effort: Pulmonary effort is normal. No respiratory distress.     Breath sounds: Normal breath sounds. No stridor. No wheezing, rhonchi or rales.  Chest:     Chest wall: No tenderness.  Abdominal:     General: Bowel sounds are normal. There is no distension.     Palpations: Abdomen is soft.     Tenderness: There is no abdominal tenderness. There is no right CVA tenderness, left CVA tenderness, guarding or rebound.  Genitourinary:    Exam position: Knee-chest position.     Rectum: Tenderness and external hemorrhoid present. No mass, anal fissure or internal hemorrhoid. Normal anal tone.     Comments: Multiple anal skin tags,  Skin:    General: Skin is warm and dry.     Capillary Refill: Capillary refill takes less than 2 seconds.     Coloration: Skin is not pale.     Findings: No rash.  Neurological:     Mental Status: She is alert.     Motor: No abnormal muscle tone.  Psychiatric:        Speech: Speech normal.     Rectal exam was performed with a chaperone  present.  Anoscope was inserted to a depth of approximately 3 inches.  There were a few small internal  hemorrhoids visualized but no active source of bleeding was identified.      Assessment & Plan:  BRBPR (bright red blood per rectum) Physical exam today does not clearly show a source of the bleeding.  The patient has external hemorrhoids.  There are few small internal hemorrhoids but no visible source of bleeding and patient is guaiac negative today on exam.  Given the large amount of blood the patient states that she saw in her floor after a bowel movement and on her shoes and close after urinating, I suspect that she may have either an internal hemorrhoid that is bleeding or an AVM that was bleeding.  Patient would like to see a gastroenterologist given the fact that the source is still unknown     Susy Frizzle, MD 08/06/20 3:36 PM

## 2020-08-09 ENCOUNTER — Ambulatory Visit
Admission: EM | Admit: 2020-08-09 | Discharge: 2020-08-09 | Disposition: A | Discharge status: Home / Self Care | Payer: 59 | Attending: Emergency Medicine | Admitting: Emergency Medicine

## 2020-08-09 ENCOUNTER — Other Ambulatory Visit: Payer: Self-pay

## 2020-08-09 DIAGNOSIS — Z1152 Encounter for screening for COVID-19: Secondary | ICD-10-CM | POA: Diagnosis not present

## 2020-08-09 DIAGNOSIS — J0101 Acute recurrent maxillary sinusitis: Secondary | ICD-10-CM | POA: Diagnosis not present

## 2020-08-09 MED ORDER — CEFDINIR 300 MG PO CAPS: 300.0000 mg | ORAL_CAPSULE | Freq: Two times a day (BID) | ORAL | 0 refills | Status: AC

## 2020-08-09 NOTE — ED Triage Notes (Signed)
Pt presents with covid exposure and symptoms began on Friday
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
## 2020-08-09 NOTE — Discharge Instructions (Addendum)
COVID testing ordered.  It will take between 2-7 days for test results.  Someone will contact you regarding abnormal results.    In the meantime: You should remain isolated in your home for 10 days from symptom onset AND greater than 24 hours after symptoms resolution (absence of fever without the use of fever-reducing medication and improvement in respiratory symptoms), whichever is longer Get plenty of rest and push fluids Cefdinir was prescribed for acute sinusitis Use medications daily for symptom relief Use OTC medications like ibuprofen or tylenol as needed fever or pain Call or go to the ED if you have any new or worsening symptoms such as fever, worsening cough, shortness of breath, chest tightness, chest pain, turning blue, changes in mental status, etc..Marland Kitchen

## 2020-08-12 LAB — NOVEL CORONAVIRUS, NAA: SARS-CoV-2, NAA: NOT DETECTED

## 2020-08-15 ENCOUNTER — Other Ambulatory Visit: Payer: Self-pay

## 2020-08-15 ENCOUNTER — Encounter: Payer: Self-pay | Admitting: Emergency Medicine

## 2020-08-15 ENCOUNTER — Ambulatory Visit
Admission: EM | Admit: 2020-08-15 | Discharge: 2020-08-15 | Disposition: A | Discharge status: Home / Self Care | Payer: 59 | Attending: Emergency Medicine | Admitting: Emergency Medicine

## 2020-08-15 DIAGNOSIS — B9789 Other viral agents as the cause of diseases classified elsewhere: Secondary | ICD-10-CM

## 2020-08-15 DIAGNOSIS — J329 Chronic sinusitis, unspecified: Secondary | ICD-10-CM | POA: Diagnosis not present

## 2020-08-15 DIAGNOSIS — J069 Acute upper respiratory infection, unspecified: Secondary | ICD-10-CM

## 2020-08-15 MED ORDER — ALBUTEROL SULFATE HFA 108 (90 BASE) MCG/ACT IN AERS: 1.0000 | INHALATION_SPRAY | Freq: Four times a day (QID) | RESPIRATORY_TRACT | 0 refills | Status: DC | PRN

## 2020-08-15 MED ORDER — BENZONATATE 100 MG PO CAPS: 100.0000 mg | ORAL_CAPSULE | Freq: Three times a day (TID) | ORAL | 0 refills | Status: DC

## 2020-08-15 NOTE — ED Triage Notes (Signed)
Patient states that she has sinus congestion, chills-sweats. Negative covid test
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
## 2020-08-15 NOTE — ED Notes (Signed)
Pt was not covid tested, printed order in error

## 2020-08-15 NOTE — Discharge Instructions (Addendum)
Get plenty of rest and push fluids Tessalon Perles prescribed for cough ProAir was prescribed  use medications daily for symptom relief Use OTC medications like ibuprofen or tylenol as needed fever or pain Call or go to the ED if you have any new or worsening symptoms such as fever, worsening cough, shortness of breath, chest tightness, chest pain, turning blue, changes in mental status, etc..Marland Kitchen

## 2020-08-16 ENCOUNTER — Ambulatory Visit: Payer: 59 | Admitting: Nurse Practitioner

## 2020-08-16 ENCOUNTER — Telehealth: Payer: Self-pay | Admitting: Family Medicine

## 2020-08-16 NOTE — Telephone Encounter (Signed)
CB# [phone] Pt stated she need to been seen today had fever last night she doesn't have a fever now??

## 2020-08-17 ENCOUNTER — Other Ambulatory Visit: Payer: Self-pay

## 2020-08-17 ENCOUNTER — Encounter: Payer: Self-pay | Admitting: Family Medicine

## 2020-08-17 ENCOUNTER — Ambulatory Visit (INDEPENDENT_AMBULATORY_CARE_PROVIDER_SITE_OTHER): Payer: 59 | Admitting: Nurse Practitioner

## 2020-08-17 VITALS — BP 116/80 | HR 79 | Temp 98.2°F | Resp 18 | Wt 125.4 lb

## 2020-08-17 DIAGNOSIS — J069 Acute upper respiratory infection, unspecified: Secondary | ICD-10-CM

## 2020-08-17 DIAGNOSIS — J00 Acute nasopharyngitis [common cold]: Secondary | ICD-10-CM | POA: Diagnosis not present

## 2020-08-17 DIAGNOSIS — R05 Cough: Secondary | ICD-10-CM | POA: Diagnosis not present

## 2020-08-17 DIAGNOSIS — R059 Cough, unspecified: Secondary | ICD-10-CM

## 2020-08-17 MED ORDER — FLUTICASONE PROPIONATE 50 MCG/ACT NA SUSP: 2.0000 | Freq: Every day | NASAL | 6 refills | Status: DC

## 2020-08-17 NOTE — Telephone Encounter (Signed)
Patient was seen by Efrat on 08/17/20
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
## 2020-08-18 LAB — RSV SCREEN (NASOPHARYNGEAL) NOT AT ARMC
MICRO NUMBER:: 10948029
RESULT:: NOT DETECTED
SPECIMEN QUALITY:: ADEQUATE

## 2020-08-19 ENCOUNTER — Telehealth: Payer: Self-pay | Admitting: Family Medicine

## 2020-08-19 NOTE — Telephone Encounter (Signed)
CB# [phone] Pt was  tested positive for Covid-19 seen by [place] NP. Need a note for working stating how long she need  to be [place]

## 2020-08-19 NOTE — Telephone Encounter (Signed)
Please advise 

## 2020-08-19 NOTE — Telephone Encounter (Signed)
Patient needs to be out of work 10 days from symptom onset.  Please draft note stating she can return 10 days from the date Deziree saw her.

## 2020-08-20 ENCOUNTER — Telehealth: Payer: Self-pay | Admitting: Oncology

## 2020-08-20 ENCOUNTER — Encounter: Payer: Self-pay | Admitting: Oncology

## 2020-08-20 LAB — INFLUENZA A AND B AG, IMMUNOASSAY

## 2020-08-20 LAB — SARS-COV-2 RNA,(COVID-19) QUALITATIVE NAAT: SARS CoV2 RNA: DETECTED — CR
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
## 2020-09-01 ENCOUNTER — Telehealth: Payer: Self-pay

## 2020-09-01 NOTE — Telephone Encounter (Signed)
Pt tested positive for COVID, she doesn't have any energy, she's still coughing, very weak, she's been out of work prior 2 weeks to testing positive. She's wanting to know what she can do to regain her strength. She is on medical leave and can't return back to work until released from her PCP.

## 2020-09-02 NOTE — Telephone Encounter (Signed)
Spoke with Pt giving recommendations from Provider. Pt verbalized understanding of instructions.

## 2020-09-02 NOTE — Telephone Encounter (Signed)
Recommend vitamin c, vitamin d, and zinc.  Time will help with fatigue.  Able to return to work 14 days after diagnosis.

## 2020-09-13 ENCOUNTER — Telehealth: Payer: Self-pay

## 2020-09-13 NOTE — Telephone Encounter (Signed)
Pt is calling wanting a letter to be able to go back to work tomorrow pt has been out of work for a month. Pt is getting over Covid. Pt wants to pick up letter this afternoon.   Pt call back [phone]

## 2020-09-16 NOTE — Telephone Encounter (Signed)
Patient is calling again for work note. She says she can either pick it up today or it can be sent via mychart.  CB# [phone]

## 2020-09-16 NOTE — Telephone Encounter (Signed)
Please Advise

## 2020-09-17 NOTE — Telephone Encounter (Signed)
Patient can return to work.  Please provide note.

## 2020-09-23 LAB — HM COLONOSCOPY

## 2020-09-24 ENCOUNTER — Telehealth: Payer: Self-pay | Admitting: Family Medicine

## 2020-09-24 ENCOUNTER — Encounter: Payer: Self-pay | Admitting: Family Medicine

## 2020-09-24 NOTE — Telephone Encounter (Signed)
Provider has responded and advised her she could return back to work as follows.

## 2020-09-24 NOTE — Telephone Encounter (Signed)
She can return to work as I originally stated.

## 2020-09-24 NOTE — Telephone Encounter (Signed)
Error

## 2020-09-24 NOTE — Telephone Encounter (Signed)
CB # [phone] Pt call couple weeks ago for a return to note to work from testing  positive for  Covid 19 end of September  would like to return work on Monday 09/27/2020

## 2020-09-24 NOTE — Telephone Encounter (Signed)
I have sent her a letter via mychart stating that she could return on 09/13/2020 as previously noted.

## 2020-10-08 ENCOUNTER — Encounter: Payer: Self-pay | Admitting: *Deleted

## 2020-12-02 ENCOUNTER — Other Ambulatory Visit: Payer: Self-pay | Admitting: *Deleted

## 2020-12-02 MED ORDER — PROPRANOLOL HCL 10 MG PO TABS
10.0000 mg | ORAL_TABLET | Freq: Every day | ORAL | 0 refills | Status: DC
Start: 2020-12-02 — End: 2022-01-12

## 2020-12-15 ENCOUNTER — Ambulatory Visit
Admission: EM | Admit: 2020-12-15 | Discharge: 2020-12-15 | Disposition: A | Discharge status: Home / Self Care | Payer: 59 | Attending: Family Medicine | Admitting: Family Medicine

## 2020-12-15 ENCOUNTER — Encounter: Payer: Self-pay | Admitting: Emergency Medicine

## 2020-12-15 DIAGNOSIS — J069 Acute upper respiratory infection, unspecified: Secondary | ICD-10-CM | POA: Insufficient documentation

## 2020-12-15 DIAGNOSIS — R519 Headache, unspecified: Secondary | ICD-10-CM | POA: Insufficient documentation

## 2020-12-15 DIAGNOSIS — B349 Viral infection, unspecified: Secondary | ICD-10-CM | POA: Insufficient documentation

## 2020-12-15 DIAGNOSIS — R110 Nausea: Secondary | ICD-10-CM | POA: Diagnosis present

## 2020-12-15 DIAGNOSIS — J029 Acute pharyngitis, unspecified: Secondary | ICD-10-CM | POA: Insufficient documentation

## 2020-12-15 DIAGNOSIS — R0981 Nasal congestion: Secondary | ICD-10-CM | POA: Diagnosis present

## 2020-12-15 DIAGNOSIS — R52 Pain, unspecified: Secondary | ICD-10-CM | POA: Diagnosis present

## 2020-12-15 DIAGNOSIS — J3489 Other specified disorders of nose and nasal sinuses: Secondary | ICD-10-CM | POA: Insufficient documentation

## 2020-12-15 DIAGNOSIS — R6883 Chills (without fever): Secondary | ICD-10-CM | POA: Diagnosis present

## 2020-12-15 LAB — POCT RAPID STREP A (OFFICE): Rapid Strep A Screen: NEGATIVE

## 2020-12-15 MED ORDER — CEFDINIR 300 MG PO CAPS: 300.0000 mg | ORAL_CAPSULE | Freq: Two times a day (BID) | ORAL | 0 refills | Status: AC

## 2020-12-15 MED ORDER — ONDANSETRON HCL 4 MG PO TABS: 4.0000 mg | ORAL_TABLET | Freq: Four times a day (QID) | ORAL | 0 refills | Status: DC

## 2020-12-15 MED ORDER — FLUCONAZOLE 200 MG PO TABS: 200.0000 mg | ORAL_TABLET | Freq: Once | ORAL | 1 refills | Status: AC

## 2020-12-15 NOTE — ED Triage Notes (Signed)
Pt started having a headache on and off since Saturday.  This morning she is feeling congestion, achy, nauseated, sore throat and ear pain

## 2020-12-15 NOTE — Discharge Instructions (Addendum)
Hair, Skin and Nails may be taken for a couple of months  I have sent in Cefdinir for you to get filled if your Covid test is negative, you can get this filled  Your COVID test is pending.  You should self quarantine until the test result is back.    Take Tylenol or ibuprofen as needed for fever or discomfort.  Rest and keep yourself hydrated.    Follow-up with your primary care provider if your symptoms are not improving.
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
## 2020-12-17 LAB — COVID-19, FLU A+B NAA
Influenza A, NAA: NOT DETECTED
Influenza B, NAA: NOT DETECTED
SARS-CoV-2, NAA: NOT DETECTED

## 2020-12-18 LAB — CULTURE, GROUP A STREP (THRC)

## 2021-01-06 ENCOUNTER — Ambulatory Visit (INDEPENDENT_AMBULATORY_CARE_PROVIDER_SITE_OTHER): Payer: 59 | Admitting: Family Medicine

## 2021-01-06 ENCOUNTER — Encounter: Payer: Self-pay | Admitting: Family Medicine

## 2021-01-06 ENCOUNTER — Other Ambulatory Visit: Payer: Self-pay

## 2021-01-06 VITALS — BP 120/82 | HR 78 | Temp 98.8°F | Ht 65.0 in | Wt 129.0 lb

## 2021-01-06 DIAGNOSIS — R634 Abnormal weight loss: Secondary | ICD-10-CM | POA: Diagnosis not present

## 2021-01-06 DIAGNOSIS — L659 Nonscarring hair loss, unspecified: Secondary | ICD-10-CM

## 2021-01-06 DIAGNOSIS — Z8616 Personal history of COVID-19: Secondary | ICD-10-CM | POA: Diagnosis not present

## 2021-01-06 DIAGNOSIS — Z111 Encounter for screening for respiratory tuberculosis: Secondary | ICD-10-CM | POA: Diagnosis not present
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
## 2021-01-07 LAB — CBC WITH DIFFERENTIAL/PLATELET
Absolute Monocytes: 467 cells/uL (ref 200–950)
Basophils Absolute: 51 cells/uL (ref 0–200)
Basophils Relative: 0.8 %
Eosinophils Absolute: 83 cells/uL (ref 15–500)
Eosinophils Relative: 1.3 %
HCT: 38.5 % (ref 35.0–45.0)
Hemoglobin: 12.6 g/dL (ref 11.7–15.5)
Lymphs Abs: 1318 cells/uL (ref 850–3900)
MCH: 31.1 pg (ref 27.0–33.0)
MCHC: 32.7 g/dL (ref 32.0–36.0)
MCV: 95.1 fL (ref 80.0–100.0)
MPV: 12.7 fL — ABNORMAL HIGH (ref 7.5–12.5)
Monocytes Relative: 7.3 %
Neutro Abs: 4480 cells/uL (ref 1500–7800)
Neutrophils Relative %: 70 %
Platelets: 147 10*3/uL (ref 140–400)
RBC: 4.05 10*6/uL (ref 3.80–5.10)
RDW: 11.2 % (ref 11.0–15.0)
Total Lymphocyte: 20.6 %
WBC: 6.4 10*3/uL (ref 3.8–10.8)

## 2021-01-07 LAB — COMPLETE METABOLIC PANEL WITH GFR
AG Ratio: 2.3 (calc) (ref 1.0–2.5)
ALT: 12 U/L (ref 6–29)
AST: 12 U/L (ref 10–30)
Albumin: 4.4 g/dL (ref 3.6–5.1)
Alkaline phosphatase (APISO): 43 U/L (ref 31–125)
BUN: 13 mg/dL (ref 7–25)
CO2: 24 mmol/L (ref 20–32)
Calcium: 9 mg/dL (ref 8.6–10.2)
Chloride: 106 mmol/L (ref 98–110)
Creat: 0.63 mg/dL (ref 0.50–1.10)
GFR, Est African American: 126 mL/min/{1.73_m2} (ref >=60)
GFR, Est Non African American: 109 mL/min/{1.73_m2} (ref >=60)
Globulin: 1.9 g/dL (calc) (ref 1.9–3.7)
Glucose, Bld: 78 mg/dL (ref 65–99)
Potassium: 4 mmol/L (ref 3.5–5.3)
Sodium: 139 mmol/L (ref 135–146)
Total Bilirubin: 0.5 mg/dL (ref 0.2–1.2)
Total Protein: 6.3 g/dL (ref 6.1–8.1)

## 2021-01-07 LAB — TSH: TSH: 1.68 mIU/L

## 2021-01-09 LAB — QUANTIFERON-TB GOLD PLUS
Mitogen-NIL: >10 IU/mL
NIL: 0.02 IU/mL
QuantiFERON-TB Gold Plus: NEGATIVE
TB1-NIL: 0 IU/mL
TB2-NIL: 0.02 IU/mL

## 2021-01-26 ENCOUNTER — Ambulatory Visit: Payer: Self-pay

## 2021-01-28 ENCOUNTER — Encounter: Payer: Self-pay | Admitting: Emergency Medicine

## 2021-01-28 ENCOUNTER — Other Ambulatory Visit: Payer: Self-pay

## 2021-01-28 ENCOUNTER — Ambulatory Visit
Admission: EM | Admit: 2021-01-28 | Discharge: 2021-01-28 | Disposition: A | Discharge status: Home / Self Care | Payer: 59 | Attending: Emergency Medicine | Admitting: Emergency Medicine

## 2021-01-28 DIAGNOSIS — R5383 Other fatigue: Secondary | ICD-10-CM | POA: Diagnosis not present

## 2021-01-28 DIAGNOSIS — R109 Unspecified abdominal pain: Secondary | ICD-10-CM | POA: Diagnosis present

## 2021-01-28 DIAGNOSIS — Z20822 Contact with and (suspected) exposure to covid-19: Secondary | ICD-10-CM | POA: Diagnosis not present

## 2021-01-28 DIAGNOSIS — R110 Nausea: Secondary | ICD-10-CM | POA: Diagnosis present

## 2021-01-28 DIAGNOSIS — Z1152 Encounter for screening for COVID-19: Secondary | ICD-10-CM

## 2021-01-28 LAB — POCT URINALYSIS DIP (MANUAL ENTRY)
Glucose, UA: NEGATIVE mg/dL
Ketones, POC UA: NEGATIVE mg/dL
Leukocytes, UA: NEGATIVE
Nitrite, UA: NEGATIVE
Protein Ur, POC: 30 mg/dL — AB
Spec Grav, UA: >=1.03 — AB (ref 1.010–1.025)
Urobilinogen, UA: 1 E.U./dL
pH, UA: 5.5 (ref 5.0–8.0)

## 2021-01-28 NOTE — Discharge Instructions (Signed)
COVID testing ordered.  It will take between 2-7 days for test results.  Someone will contact you regarding abnormal results.     Get plenty of rest and push fluids Continue to use programs for nausea as prescribed and directed Follow-up with PCP Use medications daily for symptom relief Use OTC medications like ibuprofen or tylenol as needed fever or pain Call or go to the ED if you have any new or worsening symptoms such as fever, worsening cough, shortness of breath, chest tightness, chest pain, turning blue, changes in mental status, et

## 2021-01-28 NOTE — ED Triage Notes (Signed)
Nauseated since Tuesday, feels weak, abd pain.
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
## 2021-01-29 LAB — COVID-19, FLU A+B NAA
Influenza A, NAA: NOT DETECTED
Influenza B, NAA: NOT DETECTED
SARS-CoV-2, NAA: NOT DETECTED

## 2021-01-30 LAB — URINE CULTURE: Culture: >=100000 — AB

## 2021-01-31 ENCOUNTER — Encounter: Payer: Self-pay | Admitting: Family Medicine

## 2021-01-31 ENCOUNTER — Ambulatory Visit (INDEPENDENT_AMBULATORY_CARE_PROVIDER_SITE_OTHER): Payer: 59 | Admitting: Family Medicine

## 2021-01-31 ENCOUNTER — Other Ambulatory Visit: Payer: Self-pay

## 2021-01-31 ENCOUNTER — Ambulatory Visit (HOSPITAL_COMMUNITY)
Admission: RE | Admit: 2021-01-31 | Discharge: 2021-01-31 | Disposition: A | Discharge status: Home / Self Care | Payer: 59 | Source: Ambulatory Visit | Attending: Family Medicine | Admitting: Family Medicine

## 2021-01-31 VITALS — BP 128/78 | HR 68 | Temp 98.9°F | Resp 16 | Ht 65.0 in | Wt 123.0 lb

## 2021-01-31 DIAGNOSIS — R1032 Left lower quadrant pain: Secondary | ICD-10-CM | POA: Diagnosis not present

## 2021-01-31 DIAGNOSIS — K219 Gastro-esophageal reflux disease without esophagitis: Secondary | ICD-10-CM | POA: Diagnosis not present

## 2021-01-31 LAB — POCT PREGNANCY, URINE: Preg Test, Ur: NEGATIVE

## 2021-01-31 MED ORDER — OMEPRAZOLE 40 MG PO CPDR: 40.0000 mg | DELAYED_RELEASE_CAPSULE | Freq: Every day | ORAL | 3 refills | Status: DC

## 2021-01-31 MED ORDER — PROMETHAZINE HCL 25 MG PO TABS: 25.0000 mg | ORAL_TABLET | Freq: Three times a day (TID) | ORAL | 0 refills | Status: DC | PRN
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
## 2021-01-31 NOTE — Addendum Note (Signed)
Addended by: Jenna Luo T on: 01/31/2021 03:11 PM   Modules accepted: Orders

## 2021-02-01 ENCOUNTER — Telehealth: Payer: Self-pay | Admitting: Family Medicine

## 2021-02-01 LAB — CBC WITH DIFFERENTIAL/PLATELET
Absolute Monocytes: 480 cells/uL (ref 200–950)
Basophils Absolute: 49 cells/uL (ref 0–200)
Basophils Relative: 1 %
Eosinophils Absolute: 88 cells/uL (ref 15–500)
Eosinophils Relative: 1.8 %
HCT: 38.7 % (ref 35.0–45.0)
Hemoglobin: 13.2 g/dL (ref 11.7–15.5)
Lymphs Abs: 1372 cells/uL (ref 850–3900)
MCH: 31.5 pg (ref 27.0–33.0)
MCHC: 34.1 g/dL (ref 32.0–36.0)
MCV: 92.4 fL (ref 80.0–100.0)
MPV: 13.1 fL — ABNORMAL HIGH (ref 7.5–12.5)
Monocytes Relative: 9.8 %
Neutro Abs: 2911 cells/uL (ref 1500–7800)
Neutrophils Relative %: 59.4 %
Platelets: 176 10*3/uL (ref 140–400)
RBC: 4.19 10*6/uL (ref 3.80–5.10)
RDW: 11.7 % (ref 11.0–15.0)
Total Lymphocyte: 28 %
WBC: 4.9 10*3/uL (ref 3.8–10.8)

## 2021-02-01 LAB — COMPLETE METABOLIC PANEL WITH GFR
AG Ratio: 1.8 (calc) (ref 1.0–2.5)
ALT: 11 U/L (ref 6–29)
AST: 12 U/L (ref 10–30)
Albumin: 4.3 g/dL (ref 3.6–5.1)
Alkaline phosphatase (APISO): 44 U/L (ref 31–125)
BUN: 12 mg/dL (ref 7–25)
CO2: 26 mmol/L (ref 20–32)
Calcium: 9.3 mg/dL (ref 8.6–10.2)
Chloride: 107 mmol/L (ref 98–110)
Creat: 0.81 mg/dL (ref 0.50–1.10)
GFR, Est African American: 102 mL/min/{1.73_m2} (ref >=60)
GFR, Est Non African American: 88 mL/min/{1.73_m2} (ref >=60)
Globulin: 2.4 g/dL (calc) (ref 1.9–3.7)
Glucose, Bld: 87 mg/dL (ref 65–99)
Potassium: 4 mmol/L (ref 3.5–5.3)
Sodium: 141 mmol/L (ref 135–146)
Total Bilirubin: 0.7 mg/dL (ref 0.2–1.2)
Total Protein: 6.7 g/dL (ref 6.1–8.1)

## 2021-02-01 NOTE — Telephone Encounter (Signed)
Pt called wanting to discuss the results from her x-ray.  Please call  [phone]

## 2021-02-01 NOTE — Telephone Encounter (Signed)
Please see X-ray for more information.

## 2021-02-08 ENCOUNTER — Ambulatory Visit (INDEPENDENT_AMBULATORY_CARE_PROVIDER_SITE_OTHER): Payer: 59 | Admitting: Plastic Surgery

## 2021-02-08 ENCOUNTER — Encounter: Payer: Self-pay | Admitting: Plastic Surgery

## 2021-02-08 ENCOUNTER — Other Ambulatory Visit: Payer: Self-pay

## 2021-02-08 VITALS — BP 132/84 | HR 74

## 2021-02-08 DIAGNOSIS — Z9889 Other specified postprocedural states: Secondary | ICD-10-CM

## 2021-02-08 DIAGNOSIS — Z853 Personal history of malignant neoplasm of breast: Secondary | ICD-10-CM | POA: Diagnosis not present
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
## 2021-02-09 ENCOUNTER — Encounter (HOSPITAL_BASED_OUTPATIENT_CLINIC_OR_DEPARTMENT_OTHER): Payer: Self-pay | Admitting: Obstetrics and Gynecology

## 2021-02-09 ENCOUNTER — Other Ambulatory Visit: Payer: Self-pay

## 2021-02-09 NOTE — Progress Notes (Signed)
Spoke w/ via phone for pre-op interview---pt Lab needs dos----none               Lab results------has lab appt 02-11-2021 245 pm for cbc bmp t & s serum preg COVID test ------02-14-2021 800 am Arrive at -------530 am 02-15-2021 NPO after MN NO Solid Food.  Clear liquids from MN until---430 am then npo Med rec completed Medications to take morning of surgery -----none Diabetic medication -----n/a Patient instructed to bring photo id and insurance card day of surgery Patient aware to have Driver (ride ) / caregiver   Fiance ronnie priddy or best friend jennifer pascal         Patient Special Instructions -----none Pre-Op special Istructions -----none Patient verbalized understanding of instructions that were given at this phone interview. Patient denies shortness of breath, chest pain, fever, cough at this phone interview.  Patient may have 1 person stay the night per beverly taavon, but person may not go to pre op per beverly taavon

## 2021-02-09 NOTE — Progress Notes (Addendum)
YOU ARE SCHEDULED FOR A COVID TEST 02-14-2021 @ 800 AM. THIS TEST MUST BE DONE BEFORE SURGERY. GO TO  [place]. [place], [place], IT IS APPROXIMATELY 2 MINUTES PAST [employer] ON THE RIGHT AND REMAIN IN YOUR CAR, THIS IS A DRIVE UP TEST. ONCE YOUR COVID TEST IS DONE PLEASE FOLLOW ALL THE QUARANTINE  INSTRUCTIONS GIVEN IN YOUR HANDOUT.      Your procedure is scheduled on 02-15-2021  Report to [facility].   Call this number if you have problems the morning of surgery  :[phone].   OUR ADDRESS IS [address].  WE ARE LOCATED IN [facility].  PLEASE BRING YOUR INSURANCE CARD AND PHOTO ID DAY OF SURGERY.  ONLY ONE PERSON ALLOWED IN FACILITY WAITING AREA.                                     REMEMBER:  DO NOT EAT FOOD, CANDY GUM OR MINTS  AFTER MIDNIGHT . YOU MAY HAVE CLEAR LIQUIDS FROM MIDNIGHT UNTIL 430 AM. NO CLEAR LIQUIDS AFTER 430 AM DAY OF SURGERY.   YOU MAY  BRUSH YOUR TEETH MORNING OF SURGERY AND RINSE YOUR MOUTH OUT, NO CHEWING GUM CANDY OR MINTS.    CLEAR LIQUID DIET   Foods Allowed                                                                     Foods Excluded  Coffee and tea, regular and decaf                             liquids that you cannot  Plain Jell-O any favor except red or purple                                           see through such as: Fruit ices (not with fruit pulp)                                     milk, soups, orange juice  Iced Popsicles                                    All solid food Carbonated beverages, regular and diet                                    Cranberry, grape and apple juices Sports drinks like Gatorade Lightly seasoned clear broth or consume(fat free) Sugar, honey syrup  Sample Menu Breakfast                                Lunch  Supper Cranberry juice                    Beef broth                            Chicken  broth Jell-O                                     Grape juice                           Apple juice Coffee or tea                        Jell-O                                      Popsicle                                                Coffee or tea                        Coffee or tea  _____________________________________________________________________     TAKE THESE MEDICATIONS MORNING OF SURGERY WITH A SIP OF WATER:  NONE  ONE VISITOR IS ALLOWED IN WAITING ROOM ONLY DAY OF SURGERY.  One person may spend the night with you, but the one person may not go back to pre op with you morning of surgery.                                    DO NOT WEAR JEWERLY, MAKE UP,  DO NOT WEAR LOTIONS, POWDERS, PERFUMES OR DEODORANT. DO NOT SHAVE FOR 24 HOURS PRIOR TO DAY OF SURGERY. MEN MAY SHAVE FACE AND NECK. CONTACTS, GLASSES, OR DENTURES MAY NOT BE WORN TO SURGERY.                                    [facility] IS NOT RESPONSIBLE  FOR ANY BELONGINGS.                                                                    Marland Kitchen           [facility] - Preparing for Surgery Before surgery, you can play an important role.  Because skin is not sterile, your skin needs to be as free of germs as possible.  You can reduce the number of germs on your skin by washing with CHG (chlorahexidine gluconate) soap before surgery.  CHG is an antiseptic cleaner which kills germs and bonds with the skin to continue killing germs even after washing. Please DO NOT use if you have an allergy to CHG  or antibacterial soaps.  If your skin becomes reddened/irritated stop using the CHG and inform your nurse when you arrive at Short Stay. Do not shave (including legs and underarms) for at least 48 hours prior to the first CHG shower.  You may shave your face/neck. Please follow these instructions carefully:  1.  Shower with CHG Soap the night before surgery and the  morning of Surgery.  2.  If you choose to wash your hair, wash your hair  first as usual with your  normal  shampoo.  3.  After you shampoo, rinse your hair and body thoroughly to remove the  shampoo.                            4.  Use CHG as you would any other liquid soap.  You can apply chg directly  to the skin and wash                      Gently with a scrungie or clean washcloth.  5.  Apply the CHG Soap to your body ONLY FROM THE NECK DOWN.   Do not use on face/ open                           Wound or open sores. Avoid contact with eyes, ears mouth and genitals (private parts).                       Wash face,  Genitals (private parts) with your normal soap.             6.  Wash thoroughly, paying special attention to the area where your surgery  will be performed.  7.  Thoroughly rinse your body with warm water from the neck down.  8.  DO NOT shower/wash with your normal soap after using and rinsing off  the CHG Soap.                9.  Pat yourself dry with a clean towel.            10.  Wear clean pajamas.            11.  Place clean sheets on your bed the night of your first shower and do not  sleep with pets. Day of Surgery : Do not apply any lotions/deodorants the morning of surgery.  Please wear clean clothes to the hospital/surgery center.  FAILURE TO FOLLOW THESE INSTRUCTIONS MAY RESULT IN THE CANCELLATION OF YOUR SURGERY PATIENT SIGNATURE_________________________________  NURSE SIGNATURE__________________________________  ________________________________________________________________________                                                        QUESTIONS Ashley [name] PRE OP NURSE PHONE [phone]
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
## 2021-02-11 ENCOUNTER — Other Ambulatory Visit: Payer: Self-pay

## 2021-02-11 ENCOUNTER — Encounter (HOSPITAL_COMMUNITY)
Admission: RE | Admit: 2021-02-11 | Discharge: 2021-02-11 | Disposition: A | Discharge status: Home / Self Care | Payer: 59 | Source: Ambulatory Visit | Attending: Obstetrics and Gynecology | Admitting: Obstetrics and Gynecology

## 2021-02-11 DIAGNOSIS — Z01812 Encounter for preprocedural laboratory examination: Secondary | ICD-10-CM | POA: Insufficient documentation

## 2021-02-11 LAB — CBC
HCT: 36.4 % (ref 36.0–46.0)
Hemoglobin: 12.2 g/dL (ref 12.0–15.0)
MCH: 31.4 pg (ref 26.0–34.0)
MCHC: 33.5 g/dL (ref 30.0–36.0)
MCV: 93.6 fL (ref 80.0–100.0)
Platelets: 148 10*3/uL — ABNORMAL LOW (ref 150–400)
RBC: 3.89 MIL/uL (ref 3.87–5.11)
RDW: 11.9 % (ref 11.5–15.5)
WBC: 5.7 10*3/uL (ref 4.0–10.5)
nRBC: 0 % (ref 0.0–0.2)

## 2021-02-11 LAB — BASIC METABOLIC PANEL
Anion gap: 8 (ref 5–15)
BUN: 12 mg/dL (ref 6–20)
CO2: 25 mmol/L (ref 22–32)
Calcium: 9.4 mg/dL (ref 8.9–10.3)
Chloride: 107 mmol/L (ref 98–111)
Creatinine, Ser: 0.67 mg/dL (ref 0.44–1.00)
GFR, Estimated: >60 mL/min (ref >60)
Glucose, Bld: 89 mg/dL (ref 70–99)
Potassium: 4.1 mmol/L (ref 3.5–5.1)
Sodium: 140 mmol/L (ref 135–145)

## 2021-02-11 LAB — HCG, SERUM, QUALITATIVE: Preg, Serum: NEGATIVE

## 2021-02-14 ENCOUNTER — Other Ambulatory Visit (HOSPITAL_COMMUNITY)
Admission: RE | Admit: 2021-02-14 | Discharge: 2021-02-14 | Disposition: A | Discharge status: Home / Self Care | Payer: 59 | Source: Ambulatory Visit | Attending: Neurosurgery | Admitting: Neurosurgery

## 2021-02-14 DIAGNOSIS — Z20822 Contact with and (suspected) exposure to covid-19: Secondary | ICD-10-CM | POA: Insufficient documentation

## 2021-02-14 DIAGNOSIS — Z01812 Encounter for preprocedural laboratory examination: Secondary | ICD-10-CM | POA: Insufficient documentation

## 2021-02-14 LAB — SARS CORONAVIRUS 2 (TAT 6-24 HRS): SARS Coronavirus 2: NEGATIVE

## 2021-02-14 NOTE — Progress Notes (Addendum)
Pt called and to review pre op instructions, all pre op instructuons reviewed and patient vocalized understanding, pt vocalized what does she do if surgical soap irritates her skin, pt instructed to not use surgical soap if skin irritation occurs and use bar of gold dial soap for hs and am showers.

## 2021-02-15 ENCOUNTER — Encounter (HOSPITAL_BASED_OUTPATIENT_CLINIC_OR_DEPARTMENT_OTHER): Payer: Self-pay | Admitting: Obstetrics and Gynecology

## 2021-02-15 ENCOUNTER — Ambulatory Visit (HOSPITAL_BASED_OUTPATIENT_CLINIC_OR_DEPARTMENT_OTHER): Payer: 59 | Admitting: Anesthesiology

## 2021-02-15 ENCOUNTER — Observation Stay (HOSPITAL_BASED_OUTPATIENT_CLINIC_OR_DEPARTMENT_OTHER)
Admission: RE | Admit: 2021-02-15 | Discharge: 2021-02-16 | Disposition: A | Discharge status: Home / Self Care | Payer: 59 | Attending: Obstetrics and Gynecology | Admitting: Obstetrics and Gynecology

## 2021-02-15 ENCOUNTER — Encounter (HOSPITAL_BASED_OUTPATIENT_CLINIC_OR_DEPARTMENT_OTHER)
Admission: RE | Disposition: A | Discharge status: Home / Self Care | Payer: Self-pay | Source: Home / Self Care | Attending: Obstetrics and Gynecology

## 2021-02-15 DIAGNOSIS — Z91040 Latex allergy status: Secondary | ICD-10-CM | POA: Insufficient documentation

## 2021-02-15 DIAGNOSIS — Z880 Allergy status to penicillin: Secondary | ICD-10-CM | POA: Diagnosis not present

## 2021-02-15 DIAGNOSIS — Z87891 Personal history of nicotine dependence: Secondary | ICD-10-CM | POA: Diagnosis not present

## 2021-02-15 DIAGNOSIS — Z853 Personal history of malignant neoplasm of breast: Secondary | ICD-10-CM | POA: Insufficient documentation

## 2021-02-15 DIAGNOSIS — N800 Endometriosis of uterus: Secondary | ICD-10-CM | POA: Diagnosis not present

## 2021-02-15 DIAGNOSIS — N920 Excessive and frequent menstruation with regular cycle: Secondary | ICD-10-CM | POA: Diagnosis not present

## 2021-02-15 DIAGNOSIS — Z90710 Acquired absence of both cervix and uterus: Secondary | ICD-10-CM | POA: Diagnosis present

## 2021-02-15 DIAGNOSIS — N939 Abnormal uterine and vaginal bleeding, unspecified: Secondary | ICD-10-CM | POA: Diagnosis present

## 2021-02-15 HISTORY — DX: Abnormal uterine and vaginal bleeding, unspecified: N93.9

## 2021-02-15 HISTORY — PX: LAPAROSCOPIC VAGINAL HYSTERECTOMY WITH SALPINGECTOMY: SHX6680

## 2021-02-15 LAB — TYPE AND SCREEN
ABO/RH(D): A POS
Antibody Screen: NEGATIVE

## 2021-02-15 LAB — ABO/RH: ABO/RH(D): A POS

## 2021-02-15 SURGERY — HYSTERECTOMY, VAGINAL, LAPAROSCOPY-ASSISTED, WITH SALPINGECTOMY
Anesthesia: General | Laterality: Bilateral

## 2021-02-15 MED ORDER — PROPRANOLOL HCL 10 MG PO TABS
10.0000 mg | ORAL_TABLET | Freq: Every day | ORAL | Status: DC
  Filled 2021-02-15: qty 1

## 2021-02-15 MED ORDER — NALOXONE HCL 0.4 MG/ML IJ SOLN: 0.4000 mg | INTRAMUSCULAR | Status: DC | PRN

## 2021-02-15 MED ORDER — ROCURONIUM BROMIDE 100 MG/10ML IV SOLN
INTRAVENOUS | Status: DC | PRN
  Administered 2021-02-15: 10 mg via INTRAVENOUS
  Administered 2021-02-15: 50 mg via INTRAVENOUS

## 2021-02-15 MED ORDER — PROPRANOLOL HCL 20 MG PO TABS
20.0000 mg | ORAL_TABLET | Freq: Every day | ORAL | Status: DC
  Administered 2021-02-15: 20 mg via ORAL
  Filled 2021-02-15 (×2): qty 1

## 2021-02-15 MED ORDER — APREPITANT 40 MG PO CAPS
ORAL_CAPSULE | ORAL | Status: AC
  Filled 2021-02-15: qty 1

## 2021-02-15 MED ORDER — PROPOFOL 10 MG/ML IV BOLUS
INTRAVENOUS | Status: DC | PRN
  Administered 2021-02-15: 120 mg via INTRAVENOUS

## 2021-02-15 MED ORDER — BUPIVACAINE HCL (PF) 0.25 % IJ SOLN
INTRAMUSCULAR | Status: DC | PRN
  Administered 2021-02-15: 5 mL

## 2021-02-15 MED ORDER — PROMETHAZINE HCL 25 MG/ML IJ SOLN: 6.2500 mg | INTRAMUSCULAR | Status: DC | PRN

## 2021-02-15 MED ORDER — PROMETHAZINE HCL 25 MG/ML IJ SOLN
INTRAMUSCULAR | Status: AC
  Filled 2021-02-15: qty 1

## 2021-02-15 MED ORDER — ESMOLOL HCL 100 MG/10ML IV SOLN
INTRAVENOUS | Status: AC
  Filled 2021-02-15: qty 10

## 2021-02-15 MED ORDER — ROCURONIUM BROMIDE 10 MG/ML (PF) SYRINGE
PREFILLED_SYRINGE | INTRAVENOUS | Status: AC
  Filled 2021-02-15: qty 10

## 2021-02-15 MED ORDER — SODIUM CHLORIDE 0.9 % IR SOLN
Status: DC | PRN
  Administered 2021-02-15: 1000 mL

## 2021-02-15 MED ORDER — FENTANYL CITRATE (PF) 100 MCG/2ML IJ SOLN
INTRAMUSCULAR | Status: AC
  Filled 2021-02-15: qty 2

## 2021-02-15 MED ORDER — AMISULPRIDE (ANTIEMETIC) 5 MG/2ML IV SOLN
10.0000 mg | Freq: Once | INTRAVENOUS | Status: AC
  Administered 2021-02-15: 10 mg via INTRAVENOUS

## 2021-02-15 MED ORDER — PROPOFOL 10 MG/ML IV BOLUS
INTRAVENOUS | Status: AC
  Filled 2021-02-15: qty 20

## 2021-02-15 MED ORDER — SCOPOLAMINE 1 MG/3DAYS TD PT72: 1.0000 | MEDICATED_PATCH | TRANSDERMAL | Status: DC

## 2021-02-15 MED ORDER — LACTATED RINGERS IV SOLN: INTRAVENOUS | Status: DC

## 2021-02-15 MED ORDER — DIPHENHYDRAMINE HCL 50 MG/ML IJ SOLN
INTRAMUSCULAR | Status: DC | PRN
  Administered 2021-02-15: 12.5 mg via INTRAVENOUS

## 2021-02-15 MED ORDER — ACETAMINOPHEN 10 MG/ML IV SOLN
INTRAVENOUS | Status: DC | PRN
  Administered 2021-02-15: 1000 mg via INTRAVENOUS

## 2021-02-15 MED ORDER — DEXAMETHASONE SODIUM PHOSPHATE 10 MG/ML IJ SOLN
INTRAMUSCULAR | Status: AC
  Filled 2021-02-15: qty 1

## 2021-02-15 MED ORDER — CLINDAMYCIN PHOSPHATE 900 MG/50ML IV SOLN
INTRAVENOUS | Status: AC
  Filled 2021-02-15: qty 50

## 2021-02-15 MED ORDER — POVIDONE-IODINE 10 % EX SWAB
2.0000 "application " | Freq: Once | CUTANEOUS | Status: AC
  Administered 2021-02-15: 2 via TOPICAL

## 2021-02-15 MED ORDER — PROMETHAZINE HCL 25 MG/ML IJ SOLN
6.2500 mg | Freq: Four times a day (QID) | INTRAMUSCULAR | Status: DC | PRN
  Administered 2021-02-15 (×3): 6.25 mg via INTRAVENOUS

## 2021-02-15 MED ORDER — DEXAMETHASONE SODIUM PHOSPHATE 10 MG/ML IJ SOLN
INTRAMUSCULAR | Status: DC | PRN
  Administered 2021-02-15: 5 mg via INTRAVENOUS

## 2021-02-15 MED ORDER — TRAMADOL HCL 50 MG PO TABS
ORAL_TABLET | ORAL | Status: AC
  Filled 2021-02-15: qty 1

## 2021-02-15 MED ORDER — PANTOPRAZOLE SODIUM 40 MG PO TBEC
40.0000 mg | DELAYED_RELEASE_TABLET | Freq: Every day | ORAL | Status: DC
  Administered 2021-02-15: 40 mg via ORAL

## 2021-02-15 MED ORDER — FENTANYL CITRATE (PF) 250 MCG/5ML IJ SOLN
INTRAMUSCULAR | Status: AC
  Filled 2021-02-15: qty 5

## 2021-02-15 MED ORDER — DIPHENHYDRAMINE HCL 50 MG/ML IJ SOLN
INTRAMUSCULAR | Status: AC
  Filled 2021-02-15: qty 1

## 2021-02-15 MED ORDER — DIPHENHYDRAMINE HCL 50 MG/ML IJ SOLN: 12.5000 mg | Freq: Four times a day (QID) | INTRAMUSCULAR | Status: DC | PRN

## 2021-02-15 MED ORDER — KETOROLAC TROMETHAMINE 15 MG/ML IJ SOLN
15.0000 mg | Freq: Four times a day (QID) | INTRAMUSCULAR | Status: DC
  Administered 2021-02-15 – 2021-02-16 (×3): 15 mg via INTRAVENOUS

## 2021-02-15 MED ORDER — LIDOCAINE 2% (20 MG/ML) 5 ML SYRINGE
INTRAMUSCULAR | Status: DC | PRN
  Administered 2021-02-15: 40 mg via INTRAVENOUS

## 2021-02-15 MED ORDER — MIDAZOLAM HCL 5 MG/5ML IJ SOLN
INTRAMUSCULAR | Status: DC | PRN
  Administered 2021-02-15: 2 mg via INTRAVENOUS

## 2021-02-15 MED ORDER — PANTOPRAZOLE SODIUM 40 MG PO TBEC
DELAYED_RELEASE_TABLET | ORAL | Status: AC
  Filled 2021-02-15: qty 1

## 2021-02-15 MED ORDER — SCOPOLAMINE 1 MG/3DAYS TD PT72
MEDICATED_PATCH | TRANSDERMAL | Status: AC
  Filled 2021-02-15: qty 1

## 2021-02-15 MED ORDER — KETOROLAC TROMETHAMINE 30 MG/ML IJ SOLN
INTRAMUSCULAR | Status: AC
  Filled 2021-02-15: qty 1

## 2021-02-15 MED ORDER — MIDAZOLAM HCL 2 MG/2ML IJ SOLN
INTRAMUSCULAR | Status: AC
  Filled 2021-02-15: qty 2

## 2021-02-15 MED ORDER — GENTAMICIN SULFATE 40 MG/ML IJ SOLN
5.0000 mg/kg | INTRAVENOUS | Status: AC
  Administered 2021-02-15: 279.2 mg via INTRAVENOUS
  Filled 2021-02-15: qty 7

## 2021-02-15 MED ORDER — APREPITANT 40 MG PO CAPS
40.0000 mg | ORAL_CAPSULE | Freq: Once | ORAL | Status: AC
  Administered 2021-02-15: 40 mg via ORAL

## 2021-02-15 MED ORDER — FENTANYL CITRATE (PF) 100 MCG/2ML IJ SOLN
INTRAMUSCULAR | Status: DC | PRN
  Administered 2021-02-15 (×5): 50 ug via INTRAVENOUS

## 2021-02-15 MED ORDER — HYDROMORPHONE 1 MG/ML IV SOLN
INTRAVENOUS | Status: DC
Start: 2021-02-15 — End: 2021-02-15
  Administered 2021-02-15: 0.7 mg via INTRAVENOUS
  Filled 2021-02-15: qty 30

## 2021-02-15 MED ORDER — AMISULPRIDE (ANTIEMETIC) 5 MG/2ML IV SOLN
INTRAVENOUS | Status: AC
  Filled 2021-02-15: qty 4

## 2021-02-15 MED ORDER — ESMOLOL HCL 100 MG/10ML IV SOLN
INTRAVENOUS | Status: DC | PRN
  Administered 2021-02-15: 10 mg via INTRAVENOUS

## 2021-02-15 MED ORDER — TRAMADOL HCL 50 MG PO TABS
50.0000 mg | ORAL_TABLET | Freq: Four times a day (QID) | ORAL | Status: DC | PRN
Start: 2021-02-15 — End: 2021-02-16
  Administered 2021-02-15 (×2): 50 mg via ORAL

## 2021-02-15 MED ORDER — LIDOCAINE 2% (20 MG/ML) 5 ML SYRINGE
INTRAMUSCULAR | Status: AC
  Filled 2021-02-15: qty 5

## 2021-02-15 MED ORDER — PROPOFOL 500 MG/50ML IV EMUL
INTRAVENOUS | Status: DC | PRN
  Administered 2021-02-15: 150 ug/kg/min via INTRAVENOUS

## 2021-02-15 MED ORDER — DIPHENHYDRAMINE HCL 12.5 MG/5ML PO ELIX: 12.5000 mg | ORAL_SOLUTION | Freq: Four times a day (QID) | ORAL | Status: DC | PRN

## 2021-02-15 MED ORDER — PROPOFOL 500 MG/50ML IV EMUL
INTRAVENOUS | Status: AC
  Filled 2021-02-15: qty 50

## 2021-02-15 MED ORDER — MENTHOL 3 MG MT LOZG: 1.0000 | LOZENGE | OROMUCOSAL | Status: DC | PRN

## 2021-02-15 MED ORDER — OXYCODONE HCL 5 MG PO TABS: 5.0000 mg | ORAL_TABLET | ORAL | Status: DC | PRN

## 2021-02-15 MED ORDER — KETOROLAC TROMETHAMINE 15 MG/ML IJ SOLN
INTRAMUSCULAR | Status: AC
  Filled 2021-02-15: qty 1

## 2021-02-15 MED ORDER — SODIUM CHLORIDE 0.9% FLUSH: 9.0000 mL | INTRAVENOUS | Status: DC | PRN

## 2021-02-15 MED ORDER — SUGAMMADEX SODIUM 200 MG/2ML IV SOLN
INTRAVENOUS | Status: DC | PRN
  Administered 2021-02-15: 100 mg via INTRAVENOUS

## 2021-02-15 MED ORDER — FENTANYL CITRATE (PF) 100 MCG/2ML IJ SOLN
25.0000 ug | INTRAMUSCULAR | Status: DC | PRN
  Administered 2021-02-15 (×4): 25 ug via INTRAVENOUS

## 2021-02-15 MED ORDER — CLINDAMYCIN PHOSPHATE 900 MG/50ML IV SOLN
900.0000 mg | INTRAVENOUS | Status: AC
  Administered 2021-02-15: 900 mg via INTRAVENOUS

## 2021-02-15 SURGICAL SUPPLY — 59 items
BAG RETRIEVAL 10 (BASKET)
BLADE CLIPPER SENSICLIP SURGIC (BLADE) IMPLANT
CANISTER SUCT 3000ML PPV (MISCELLANEOUS) ×4 IMPLANT
CATH ROBINSON RED A/P 16FR (CATHETERS) ×1 IMPLANT
COVER BACK TABLE 60X90IN (DRAPES) ×2 IMPLANT
COVER MAYO STAND STRL (DRAPES) ×4 IMPLANT
COVER SURGICAL LIGHT HANDLE (MISCELLANEOUS) ×1 IMPLANT
COVER WAND RF STERILE (DRAPES) ×2 IMPLANT
DERMABOND ADVANCED (GAUZE/BANDAGES/DRESSINGS) ×1
DERMABOND ADVANCED .7 DNX12 (GAUZE/BANDAGES/DRESSINGS) ×1 IMPLANT
DRSG COVADERM PLUS 2X2 (GAUZE/BANDAGES/DRESSINGS) IMPLANT
DRSG OPSITE POSTOP 3X4 (GAUZE/BANDAGES/DRESSINGS) ×2 IMPLANT
DURAPREP 26ML APPLICATOR (WOUND CARE) ×2 IMPLANT
ELECT REM PT RETURN 9FT ADLT (ELECTROSURGICAL) ×2
ELECTRODE REM PT RTRN 9FT ADLT (ELECTROSURGICAL) ×1 IMPLANT
GAUZE 4X4 16PLY RFD (DISPOSABLE) ×2 IMPLANT
GLOVE SURG ENC MOIS LTX SZ7 (GLOVE) ×8 IMPLANT
GLOVE SURG LTX SZ6.5 (GLOVE) ×2 IMPLANT
HOLDER FOLEY CATH W/STRAP (MISCELLANEOUS) ×2 IMPLANT
KIT TURNOVER CYSTO (KITS) ×2 IMPLANT
NEEDLE INSUFFLATION 120MM (ENDOMECHANICALS) ×1 IMPLANT
NS IRRIG 500ML POUR BTL (IV SOLUTION) ×2 IMPLANT
PACK LAVH (CUSTOM PROCEDURE TRAY) ×2 IMPLANT
PACK ROBOTIC GOWN (GOWN DISPOSABLE) IMPLANT
PACK TRENDGUARD 450 HYBRID PRO (MISCELLANEOUS) IMPLANT
PACK VAGINAL WOMENS (CUSTOM PROCEDURE TRAY) ×1 IMPLANT
PAD OB MATERNITY 4.3X12.25 (PERSONAL CARE ITEMS) ×2 IMPLANT
PAD PREP 24X48 CUFFED NSTRL (MISCELLANEOUS) ×2 IMPLANT
SCISSORS LAP 5X45 EPIX DISP (ENDOMECHANICALS) IMPLANT
SEALER TISSUE G2 CVD JAW 45CM (ENDOMECHANICALS) ×2 IMPLANT
SET IRRIG Y TYPE TUR BLADDER L (SET/KITS/TRAYS/PACK) IMPLANT
SET SUCTION IRRIG HYDROSURG (IRRIGATION / IRRIGATOR) ×2 IMPLANT
SET TUBE SMOKE EVAC HIGH FLOW (TUBING) ×2 IMPLANT
SOLUTION ELECTROLUBE (MISCELLANEOUS) IMPLANT
STRIP CLOSURE SKIN 1/4X4 (GAUZE/BANDAGES/DRESSINGS) IMPLANT
SUT VIC AB 0 CT1 18XCR BRD8 (SUTURE) ×2 IMPLANT
SUT VIC AB 0 CT1 36 (SUTURE) ×4 IMPLANT
SUT VIC AB 0 CT1 8-18 (SUTURE) ×6
SUT VIC AB 2-0 CT1 (SUTURE) IMPLANT
SUT VIC AB 2-0 SH 27 (SUTURE)
SUT VIC AB 2-0 SH 27XBRD (SUTURE) IMPLANT
SUT VIC AB 3-0 SH 27 (SUTURE)
SUT VIC AB 3-0 SH 27X BRD (SUTURE) IMPLANT
SUT VIC AB 4-0 PS2 18 (SUTURE) ×2 IMPLANT
SUT VICRYL 0 UR6 27IN ABS (SUTURE) ×1 IMPLANT
SUT VICRYL 1 TIES 12X18 (SUTURE) ×2 IMPLANT
SYR BULB IRRIG 60ML STRL (SYRINGE) IMPLANT
SYS BAG RETRIEVAL 10MM (BASKET)
SYSTEM BAG RETRIEVAL 10MM (BASKET) IMPLANT
TOWEL OR 17X26 10 PK STRL BLUE (TOWEL DISPOSABLE) ×4 IMPLANT
TRAY FOLEY W/BAG SLVR 14FR LF (SET/KITS/TRAYS/PACK) ×2 IMPLANT
TRENDGUARD 450 HYBRID PRO PACK (MISCELLANEOUS) ×2
TROCAR BALLN 12MMX100 BLUNT (TROCAR) IMPLANT
TROCAR BLADELESS OPT 12M 100M (ENDOMECHANICALS) IMPLANT
TROCAR OPTI TIP 5M 100M (ENDOMECHANICALS) ×2 IMPLANT
TROCAR XCEL DIL TIP R 11M (ENDOMECHANICALS) IMPLANT
TUBE CONNECTING 12X1/4 (SUCTIONS) ×1 IMPLANT
WARMER LAPAROSCOPE (MISCELLANEOUS) ×2 IMPLANT
WATER STERILE IRR 500ML POUR (IV SOLUTION) ×2 IMPLANT

## 2021-02-15 NOTE — Anesthesia Preprocedure Evaluation (Addendum)
Anesthesia Evaluation  Patient identified by MRN, date of birth, ID band Patient awake    Reviewed: Allergy & Precautions, NPO status , Patient's Chart, lab work & pertinent test results, reviewed documented beta blocker date and time   History of Anesthesia Complications (+) PONV and history of anesthetic complications  Airway Mallampati: II  TM Distance: >3 FB Neck ROM: Full    Dental  (+) Dental Advisory Given, Chipped,    Pulmonary neg pulmonary ROS, former smoker,    Pulmonary exam normal        Cardiovascular negative cardio ROS Normal cardiovascular exam     Neuro/Psych  Headaches, PSYCHIATRIC DISORDERS Anxiety    GI/Hepatic Neg liver ROS, GERD  Medicated and Controlled,  Endo/Other  negative endocrine ROS  Renal/GU negative Renal ROS     Musculoskeletal negative musculoskeletal ROS (+)   Abdominal   Peds  Hematology  Plt 148k    Anesthesia Other Findings Covid+ 08/2020 Covid test negative Chronic back pain   Reproductive/Obstetrics                            Anesthesia Physical Anesthesia Plan  ASA: II  Anesthesia Plan: General   Post-op Pain Management:    Induction: Intravenous  PONV Risk Score and Plan: 4 or greater and Treatment may vary due to age or medical condition, Midazolam, Aprepitant, TIVA and Dexamethasone  Airway Management Planned: Oral ETT  Additional Equipment: None  Intra-op Plan:   Post-operative Plan: Extubation in OR  Informed Consent: I have reviewed the patients History and Physical, chart, labs and discussed the procedure including the risks, benefits and alternatives for the proposed anesthesia with the patient or authorized representative who has indicated his/her understanding and acceptance.     Dental advisory given  Plan Discussed with: CRNA and Anesthesiologist  Anesthesia Plan Comments:        Anesthesia Quick  Evaluation

## 2021-02-15 NOTE — H&P (Signed)
  History and physical exam unchanged 

## 2021-02-15 NOTE — Progress Notes (Signed)
Patient ID: Ashley [name], female   DOB: 26-Nov-1976, 45 y.o.   MRN: [id number] AF VSS ABD SOFT INC CLEAR GOOD UO NO BLEEDING

## 2021-02-15 NOTE — Transfer of Care (Signed)
Immediate Anesthesia Transfer of Care Note  Patient: Ashley [name]  Procedure(s) Performed: LAPAROSCOPIC ASSISTED VAGINAL HYSTERECTOMY WITH BILATERAL SALPINGECTOMY (Bilateral )  Patient Location: PACU  Anesthesia Type:General  Level of Consciousness: drowsy and patient cooperative  Airway & Oxygen Therapy: Patient Spontanous Breathing and Patient connected to nasal cannula oxygen  Post-op Assessment: Report given to RN and Post -op Vital signs reviewed and stable  Post vital signs: Reviewed and stable  Last Vitals:  Vitals Value Taken Time  BP    Temp    Pulse 78 02/15/21 0943  Resp 15 02/15/21 0943  SpO2 100 % 02/15/21 0943  Vitals shown include unvalidated device data.  Last Pain:  Vitals:   02/15/21 0604  TempSrc: Oral  PainSc: 0-No pain      Patients Stated Pain Goal: 3 (11/64/35 3912)  Complications: No complications documented.

## 2021-02-15 NOTE — Anesthesia Procedure Notes (Signed)
Procedure Name: Intubation Date/Time: 02/15/2021 7:32 AM Performed by: Gwyndolyn Saxon, CRNA Pre-anesthesia Checklist: Patient identified, Emergency Drugs available, Suction available and Patient being monitored Patient Re-evaluated:Patient Re-evaluated prior to induction Oxygen Delivery Method: Circle system utilized Preoxygenation: Pre-oxygenation with 100% oxygen Induction Type: IV induction Ventilation: Mask ventilation without difficulty Laryngoscope Size: Mac and 3 Grade View: Grade I Tube type: Oral Tube size: 7.0 mm Number of attempts: 1 Airway Equipment and Method: Stylet Placement Confirmation: ETT inserted through vocal cords under direct vision,  positive ETCO2 and breath sounds checked- equal and bilateral Secured at: 19 cm Tube secured with: Tape Dental Injury: Teeth and Oropharynx as per pre-operative assessment  Comments: Small chip noted in front left tooth prior to induction; no change after ETT placement

## 2021-02-15 NOTE — Anesthesia Postprocedure Evaluation (Signed)
Anesthesia Post Note  Patient: [name]  Procedure(s) Performed: LAPAROSCOPIC ASSISTED VAGINAL HYSTERECTOMY WITH BILATERAL SALPINGECTOMY (Bilateral )     Patient location during evaluation: PACU Anesthesia Type: General Level of consciousness: awake and alert Pain management: pain level controlled Vital Signs Assessment: post-procedure vital signs reviewed and stable Respiratory status: spontaneous breathing, nonlabored ventilation, respiratory function stable and patient connected to nasal cannula oxygen Cardiovascular status: blood pressure returned to baseline and stable Postop Assessment: no apparent nausea or vomiting Anesthetic complications: no   No complications documented.  Last Vitals:  Vitals:   02/15/21 1040 02/15/21 1104  BP: 139/76 (!) 143/88  Pulse: 65 70  Resp: 14 11  Temp: (!) 36.3 C   SpO2: 100% 100%    Last Pain:  Vitals:   02/15/21 1104  TempSrc:   PainSc: [place]

## 2021-02-15 NOTE — Brief Op Note (Signed)
02/15/2021  9:39 AM  PATIENT:  Ashley [name]  45 y.o. female  PRE-OPERATIVE DIAGNOSIS:  AUB; PELVIC PAIN  POST-OPERATIVE DIAGNOSIS:  AUB; PELVIC PAIN  PROCEDURE:  Procedure(s): LAPAROSCOPIC ASSISTED VAGINAL HYSTERECTOMY WITH BILATERAL SALPINGECTOMY (Bilateral)  SURGEON:  Surgeon(s) and Role:    * [name], MD - Primary    * Maisie Fus, MD - Assisting  PHYSICIAN ASSISTANT:   ASSISTANTS: neal    ANESTHESIA:   general  EBL:  300 mL   BLOOD ADMINISTERED:none  DRAINS: Urinary Catheter (Foley)   LOCAL MEDICATIONS USED:  XYLOCAINE   SPECIMEN:  Source of Specimen:  uterus and tubes  DISPOSITION OF SPECIMEN:  PATHOLOGY  COUNTS:  YES  TOURNIQUET:  * No tourniquets in log *  DICTATION: .Other Dictation: Dictation Number [id number]  PLAN OF CARE: Admit for overnight observation  PATIENT DISPOSITION:  PACU - hemodynamically stable.   Delay start of Pharmacological VTE agent (>24hrs) due to surgical blood loss or risk of bleeding: no

## 2021-02-15 NOTE — Op Note (Signed)
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  PROCEDURE:  Laparoscopic-assisted vaginal hysterectomy with removal of both fallopian tubes.  SURGEON:  Darlyn Chamber, MD  ASSISTANT:  Evette Cristal.  ANESTHESIA:  General endotracheal.  ESTIMATED BLOOD LOSS:  200 mL.  PACKS:  None.  DRAINS:  Include urethral Foley.  INTRAOPERATIVE BLOOD REPLACEMENT:  None.  COMPLICATIONS:  None.  INDICATIONS:  Dictated in history and physical.  DESCRIPTION OF PROCEDURE:  The patient was taken to the OR and placed in supine position.  After satisfactory level of general endotracheal anesthesia was obtained, the patient was placed in dorsal lithotomy position using the Allen stirrups.  The  perineum and vagina were prepped out with Hibiclens.  The abdomen was prepped with DuraPrep.  After a period of time, the patient was draped in sterile field.  Subumbilical incision was made with a knife.  Veress needle was introduced into the abdominal  cavity without difficulty.  The abdomen was insufflated with approximately 3 liters of carbon dioxide.  The 10/11 trocar was put in place.  Laparoscope was introduced.  There was no evidence of injury to adjacent organs.  A 5 mm trocar was put in place  in the suprapubic area under direct visualization.  At this point in time, a speculum was placed in the vaginal vault.  Cervix was identified.  A Hulka tenaculum was put in place and secured.  Bladder was emptied out with catheterization.  The patient  was then draped in a sterile field.  Subumbilical incision was made with a knife.  The Veress needle was then introduced into abdominal cavity.  Abdomen was inflated with  approximately 3 liters of carbon dioxide.  A 10/11 trocar was put in place along  with the laparoscope.  There was no evidence of injury to adjacent organs.  A 5 mm trocar was put in place under direct visualization.  Visualization revealed uterus to be of normal size and shape.  Tubes and ovaries were unremarkable.  Appendix was  visualized and noted to be normal.  Upper abdomen including the liver was normal.  Both lateral gutters were clear.  The EnSeal was brought in place.  The right fallopian tube was elevated.  The peritoneal attachment of the fallopian tube on the right  side was then cauterized and incised up to the round ligament.  The right round ligament was then cauterized and incised.  We then went to the left side.  The left tube was elevated.  The peritoneal attachment of the left tube was then cauterized and  incised up to the round ligament.  The round ligament was cauterized and incised.  We did use EnSeal to do this on both sides.  At this point in time, the laparoscope was removed.  Abdomen was deflated of its carbon dioxide.  The patient's legs were  repositioned.  Weighted speculum was placed in the vaginal vault.  Cervix was grasped with a Ardis Hughs tenaculum.  The cul-de-sac was then entered sharply.  Both uterosacral ligaments were clamped, cut, and suture ligated with 0 Vicryl.  The reflection of the vaginal  mucosa anteriorly was incised.  Bladder was dissected superiorly.  Using the clamp, cut, and tie technique with suture ligatures of 0 Vicryl, the parametrium was  serially separated from the sides of the uterus.  Bladder flap was then identified and  entered sharply.  Bladder was retracted superiorly.  Uterus was then flipped.  The remaining pedicles were clamped and cut.  Uterus and tubes were passed off the operative field.  Held pedicles were secured with free ties of 0 Vicryl.  Posterior vaginal  cuff was then run with a running interlocking suture of 0 chromic.  Vaginal  mucosa reapproximated with interrupted figure-of-eights of 0 chromic.  Urine output was clear and adequate.  A Foley had been placed to straight drain.  Legs were repositioned.  The laparoscope was then reintroduced.  Visualization did reveal that the vaginal cuff had come open during the time of trying to obtain hemostasis.  At this point in time, the legs were repositioned.  A weighted speculum was placed in the vaginal vault.  The vaginal cuff was identified, again had come open.  We then reapproximated the vaginal cuff with interrupted figure-of-eights of 0 Vicryl.  We did this until it was completely reapproximated.  Weighted speculum was then removed.  The laparoscope was reintroduced.  Visualization revealed the cuff to be intact.  Areas of oozing were controlled with bipolar.  Arista was then put in place.  We had good hemostasis.  Laparoscope and trocars were  then removed.  Subumbilical incision was closed with interrupted subcuticulars of 4-0 Vicryl.  The suprapubic incision was closed with Dermabond.  Urine output was clear and adequate.  There was no active vaginal bleeding.  Sponge, instrument, and needle  counts were reported as correct by circulating nurse x2.  Foley catheter remained clear at time of closure.  The patient was taken out of dorsal lithotomy position, transferred to recovery room in good condition.   [facility] D: 02/15/2021 9:37:06 am T: 02/15/2021 11:16:00 am  JOB: [id number]/ [id number]

## 2021-02-15 NOTE — Progress Notes (Signed)
Pt was woke from sleeping with c/o severe nausea despite IV phenergan 6.25 mg given at 1205. Pt refusing any PO intake other than 2-3 sips of clears. Abd soft, incisions C/D/I. Scant drainage to peripad. Foley draining clear/yellow urine, VSS. Pt pale, drowsy, alert to voice, very anxious and tearful, daughter at bedside. C/o 4-5 out of 10 abdominal pain and cramping. IV phenergan 6.25 mg given. Pt states she feels IV dilaudid PCA may be "making me sick, I don't know if I can take this stuff". According to PCA history has used PCA 2 times. Pt states she has tolerated tramadol in the past. MD notified of pt's status and progression, order obtained for tramadol. Tramadol given, will continue to monitor.

## 2021-02-16 ENCOUNTER — Encounter (HOSPITAL_BASED_OUTPATIENT_CLINIC_OR_DEPARTMENT_OTHER): Payer: Self-pay | Admitting: Obstetrics and Gynecology

## 2021-02-16 DIAGNOSIS — N800 Endometriosis of uterus: Secondary | ICD-10-CM | POA: Diagnosis not present

## 2021-02-16 LAB — CBC
HCT: 28.3 % — ABNORMAL LOW (ref 36.0–46.0)
Hemoglobin: 9.7 g/dL — ABNORMAL LOW (ref 12.0–15.0)
MCH: 31.5 pg (ref 26.0–34.0)
MCHC: 34.3 g/dL (ref 30.0–36.0)
MCV: 91.9 fL (ref 80.0–100.0)
Platelets: 131 10*3/uL — ABNORMAL LOW (ref 150–400)
RBC: 3.08 MIL/uL — ABNORMAL LOW (ref 3.87–5.11)
RDW: 11.9 % (ref 11.5–15.5)
WBC: 10.9 10*3/uL — ABNORMAL HIGH (ref 4.0–10.5)
nRBC: 0 % (ref 0.0–0.2)

## 2021-02-16 MED ORDER — TRAMADOL HCL 50 MG PO TABS
ORAL_TABLET | ORAL | Status: AC
  Filled 2021-02-16: qty 1

## 2021-02-16 MED ORDER — KETOROLAC TROMETHAMINE 15 MG/ML IJ SOLN
INTRAMUSCULAR | Status: AC
  Filled 2021-02-16: qty 1

## 2021-02-16 NOTE — Discharge Summary (Signed)
Patient name  Ashley [name], ramstad [id number] CSN# [id number]  [name], MD 02/16/2021 8:52 AM

## 2021-02-16 NOTE — Progress Notes (Signed)
1 Day Post-Op Procedure(s) (LRB): LAPAROSCOPIC ASSISTED VAGINAL HYSTERECTOMY WITH BILATERAL SALPINGECTOMY (Bilateral)  Subjective: Patient reports tolerating PO and no problems voiding.    Objective: I have reviewed patient's vital signs, intake and output and labs.  General: alert GI: soft, non-tender; bowel sounds normal; no masses,  no organomegaly Vaginal Bleeding: minimal  Assessment: s/p Procedure(s): LAPAROSCOPIC ASSISTED VAGINAL HYSTERECTOMY WITH BILATERAL SALPINGECTOMY (Bilateral): stable  Plan: Discharge home  LOS: 0 days    [name] 02/16/2021, 8:48 AM

## 2021-02-16 NOTE — Discharge Summary (Signed)
NAME: Ashley [name], CONRY MEDICAL RECORD NO: [id number] ACCOUNT NO: [id number] DATE OF BIRTH: October 31, 1976 FACILITY: [place] LOCATION: [facility] PHYSICIAN: Darlyn Chamber, MD  Discharge Summary   DATE OF DISCHARGE: 02/16/2021  ADMISSION DIAGNOSES:  Menorrhagia and abnormal bleeding.  DISCHARGE DIAGNOSES:  Menorrhagia and abnormal bleeding.  OPERATIVE PROCEDURE:  Laparoscopic-assisted vaginal hysterectomy, bilateral salpingectomy.  For complete history and physical, please see dictated note.  COURSE IN HOSPITAL:  The patient underwent above noted surgery.  Postop she did well, did have some nausea and pain management issues the evening after surgery.  The following morning, she is tolerating p.o.  Her Foley had been discontinued.  She is  voiding without difficulty.  She had no active bleeding.  Incisions were all clear.  Her hemoglobin was 9.7.  This is what was expected.  COMPLICATIONS:  None encountered during her stay in the hospital.  The patient discharged home in stable condition.  DISPOSITION:  The patient is to avoid heavy lifting, vaginal entrance or driving a car.  She is instructed to call if there would be any signs and symptoms or problems.  This can include fever, excessive vaginal bleeding, nausea, vomiting, excessive  pain.  Also instructed on signs and symptoms of deep venous thrombosis and pulmonary embolus.  She will be discharged on pain medicine as noted.  Follow up will be arranged.   PUS D: 02/16/2021 8:51:50 am T: 02/16/2021 10:42:00 am  JOB: [id number]/ [id number]

## 2021-02-17 LAB — SURGICAL PATHOLOGY

## 2021-04-18 ENCOUNTER — Telehealth: Payer: Self-pay | Admitting: Family Medicine

## 2021-04-18 DIAGNOSIS — K219 Gastro-esophageal reflux disease without esophagitis: Secondary | ICD-10-CM

## 2021-04-18 MED ORDER — OMEPRAZOLE 40 MG PO CPDR: 40.0000 mg | DELAYED_RELEASE_CAPSULE | Freq: Every day | ORAL | 3 refills | Status: DC

## 2021-04-18 NOTE — Telephone Encounter (Signed)
Patient called to request refill of omeprazole (PRILOSEC) 40 MG capsule [[id number]]   Pharmacy confirmed as  [facility] [address] [place] [address] [place], [place] [address]  Phone:  [phone] Fax:  [phone]   Patient requested refills for more than 1 month to avoid frequent refill requests.  Please advise at [phone] when Rx called in.

## 2021-04-18 NOTE — Telephone Encounter (Signed)
Prescription sent to pharmacy.

## 2021-05-12 ENCOUNTER — Other Ambulatory Visit: Payer: Self-pay

## 2021-05-12 ENCOUNTER — Ambulatory Visit
Admission: EM | Admit: 2021-05-12 | Discharge: 2021-05-12 | Disposition: A | Discharge status: Home / Self Care | Payer: 59 | Attending: Internal Medicine | Admitting: Internal Medicine

## 2021-05-12 DIAGNOSIS — B349 Viral infection, unspecified: Secondary | ICD-10-CM | POA: Diagnosis not present

## 2021-05-12 MED ORDER — IPRATROPIUM BROMIDE 0.03 % NA SOLN: 2.0000 | Freq: Two times a day (BID) | NASAL | 12 refills | Status: DC

## 2021-05-12 NOTE — Discharge Instructions (Addendum)
Use medications as directed Salt water nasal spray will be helpful Return to urgent care if symptoms worsen There is no indication for COVID-19 testing since no family members tested negative for COVID-19.

## 2021-05-12 NOTE — ED Triage Notes (Signed)
Pt presents with c/o scratchy throat and ear pain for past week
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
## 2021-05-19 ENCOUNTER — Ambulatory Visit
Admission: EM | Admit: 2021-05-19 | Discharge: 2021-05-19 | Disposition: A | Discharge status: Home / Self Care | Payer: 59 | Attending: Emergency Medicine | Admitting: Emergency Medicine

## 2021-05-19 ENCOUNTER — Encounter: Payer: Self-pay | Admitting: Emergency Medicine

## 2021-05-19 ENCOUNTER — Other Ambulatory Visit: Payer: Self-pay

## 2021-05-19 DIAGNOSIS — G43009 Migraine without aura, not intractable, without status migrainosus: Secondary | ICD-10-CM

## 2021-05-19 DIAGNOSIS — R0981 Nasal congestion: Secondary | ICD-10-CM | POA: Diagnosis not present

## 2021-05-19 MED ORDER — CEFDINIR 300 MG PO CAPS: 300.0000 mg | ORAL_CAPSULE | Freq: Two times a day (BID) | ORAL | 0 refills | Status: AC

## 2021-05-19 MED ORDER — KETOROLAC TROMETHAMINE 60 MG/2ML IM SOLN
60.0000 mg | Freq: Once | INTRAMUSCULAR | Status: AC
  Administered 2021-05-19: 60 mg via INTRAMUSCULAR

## 2021-05-19 NOTE — Discharge Instructions (Addendum)
Toradol shot given in office Cefdinir for sinus infection.  Has tolerated in the past Rest and drink plenty of fluids Use OTC medications as needed for symptomatic relief Follow up with PCP if symptoms persists Return or go to the ER if you have any new or worsening symptoms such as fever, chills, nausea, vomiting, chest pain, shortness of breath, cough, vision changes, worsening headache despite treatment, slurred speech, facial asymmetry, weakness in arms or legs, etc..Marland Kitchen
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
## 2021-05-19 NOTE — ED Triage Notes (Signed)
Migraine since last night.  States she has taken maxalt without relief.

## 2021-05-30 ENCOUNTER — Telehealth: Payer: Self-pay

## 2021-05-30 MED ORDER — FLUCONAZOLE 150 MG PO TABS: 150.0000 mg | ORAL_TABLET | Freq: Every day | ORAL | 0 refills | Status: AC

## 2021-05-30 NOTE — Telephone Encounter (Signed)
Pt requesting diflucan. Okay per [employer].

## 2021-06-03 ENCOUNTER — Other Ambulatory Visit: Payer: Self-pay

## 2021-06-03 ENCOUNTER — Encounter: Payer: Self-pay | Admitting: Emergency Medicine

## 2021-06-03 ENCOUNTER — Ambulatory Visit
Admission: EM | Admit: 2021-06-03 | Discharge: 2021-06-03 | Disposition: A | Discharge status: Home / Self Care | Payer: 59 | Attending: Emergency Medicine | Admitting: Emergency Medicine

## 2021-06-03 DIAGNOSIS — L304 Erythema intertrigo: Secondary | ICD-10-CM | POA: Diagnosis not present

## 2021-06-03 DIAGNOSIS — R21 Rash and other nonspecific skin eruption: Secondary | ICD-10-CM

## 2021-06-03 DIAGNOSIS — R6889 Other general symptoms and signs: Secondary | ICD-10-CM | POA: Diagnosis not present

## 2021-06-03 MED ORDER — CLOTRIMAZOLE 1 % EX CREA: 1.0000 "application " | TOPICAL_CREAM | Freq: Two times a day (BID) | CUTANEOUS | 0 refills | Status: DC

## 2021-06-03 MED ORDER — FLUCONAZOLE 150 MG PO TABS: ORAL_TABLET | ORAL | 0 refills | Status: DC
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
## 2021-06-03 NOTE — Discharge Instructions (Addendum)
COVID testing ordered.  It will take between 5-7 days for test results.  Someone will contact you regarding abnormal results.    In the meantime: You should remain isolated in your home for 5 days from symptom onset AND greater than 72 hours after symptoms resolution (absence of fever without the use of fever-reducing medication and improvement in respiratory symptoms), whichever is longer Get plenty of rest and push fluids Use OTC zyrtec for nasal congestion, runny nose, and/or sore throat Use OTC flonase for nasal congestion and runny nose Use medications daily for symptom relief Use OTC medications like ibuprofen or tylenol as needed fever or pain Call or go to the ED if you have any new or worsening symptoms such as fever, worsening cough, shortness of breath, chest tightness, chest pain, turning blue, changes in mental status, etc...   Daily cleansing of skin folds with a mild cleanser followed by drying of affected area with a soft cloth or hair dryer on a cool setting Airing of affected area when feasible Daily application of drying powders Use of absorbent material or clothing, such as cotton or merino wool, to separate skin in folds Clotrimazole prescribed.  Use as instructed Diflucan prescribed Follow up with PCP Go to the ED if the patient has any new or worsening symptoms such as fever, chills, decreased appetite, decreased activity, drooling, vomiting, wheezing, rash, changes in bowel or bladder function, etc..Marland Kitchen

## 2021-06-03 NOTE — ED Triage Notes (Signed)
Rash on neck  and stomach. Body aches, fever, sore throat, coughing.

## 2021-06-04 LAB — COVID-19, FLU A+B NAA
Influenza A, NAA: NOT DETECTED
Influenza B, NAA: NOT DETECTED
SARS-CoV-2, NAA: DETECTED — AB

## 2021-07-03 ENCOUNTER — Ambulatory Visit
Admission: RE | Admit: 2021-07-03 | Discharge: 2021-07-03 | Disposition: A | Discharge status: Home / Self Care | Payer: 59 | Source: Ambulatory Visit | Attending: Emergency Medicine | Admitting: Emergency Medicine

## 2021-07-03 VITALS — BP 129/88 | HR 82 | Temp 97.8°F | Resp 16 | Wt 119.7 lb

## 2021-07-03 DIAGNOSIS — R112 Nausea with vomiting, unspecified: Secondary | ICD-10-CM | POA: Insufficient documentation

## 2021-07-03 DIAGNOSIS — R109 Unspecified abdominal pain: Secondary | ICD-10-CM | POA: Diagnosis not present

## 2021-07-03 LAB — POCT URINALYSIS DIP (MANUAL ENTRY)
Bilirubin, UA: NEGATIVE
Glucose, UA: NEGATIVE mg/dL
Ketones, POC UA: NEGATIVE mg/dL
Leukocytes, UA: NEGATIVE
Nitrite, UA: NEGATIVE
Spec Grav, UA: >=1.03 — AB (ref 1.010–1.025)
Urobilinogen, UA: 0.2 E.U./dL
pH, UA: 6 (ref 5.0–8.0)

## 2021-07-03 MED ORDER — SULFAMETHOXAZOLE-TRIMETHOPRIM 800-160 MG PO TABS: 1.0000 | ORAL_TABLET | Freq: Two times a day (BID) | ORAL | 0 refills | Status: AC

## 2021-07-03 MED ORDER — PROMETHAZINE HCL 25 MG PO TABS: 25.0000 mg | ORAL_TABLET | Freq: Four times a day (QID) | ORAL | 0 refills | Status: DC | PRN
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
## 2021-07-03 NOTE — Discharge Instructions (Addendum)
Urine culture sent.  We will call you with the results.   Push fluids and get plenty of rest.   Take antibiotic as directed and to completion Phenergan was prescribed for nausea/vomiting Follow up with PCP in 1 to 2 days or if symptoms persists Return here or go to ER if you have any new or worsening symptoms such as fever, worsening abdominal pain, nausea/vomiting, flank pain, etc..Marland Kitchen

## 2021-07-03 NOTE — ED Triage Notes (Signed)
Left sided lower abdominal pain that's radiating to back.  Pain is so bad that she is throwing up.  Started getting worse this morning.  Feels like she has she urinate like every 5 minute.

## 2021-07-04 ENCOUNTER — Telehealth: Payer: Self-pay | Admitting: Emergency Medicine

## 2021-07-04 MED ORDER — CEPHALEXIN 500 MG PO CAPS: 500.0000 mg | ORAL_CAPSULE | Freq: Two times a day (BID) | ORAL | 0 refills | Status: AC

## 2021-07-04 NOTE — Telephone Encounter (Signed)
Pt called in and states the bactrim is making her sick.  Request keflex be called in to pharmacy, states she has taken that one before with no problems.

## 2021-07-05 ENCOUNTER — Encounter: Payer: Self-pay | Admitting: Emergency Medicine

## 2021-07-05 ENCOUNTER — Other Ambulatory Visit: Payer: Self-pay

## 2021-07-05 ENCOUNTER — Ambulatory Visit
Admission: EM | Admit: 2021-07-05 | Discharge: 2021-07-05 | Disposition: A | Discharge status: Home / Self Care | Payer: 59 | Attending: Family Medicine | Admitting: Family Medicine

## 2021-07-05 DIAGNOSIS — R300 Dysuria: Secondary | ICD-10-CM | POA: Diagnosis not present

## 2021-07-05 DIAGNOSIS — Z5321 Procedure and treatment not carried out due to patient leaving prior to being seen by health care provider: Secondary | ICD-10-CM

## 2021-07-05 LAB — URINE CULTURE

## 2021-07-05 NOTE — ED Notes (Signed)
Patient left before vital signs could be obtained.

## 2021-07-05 NOTE — ED Triage Notes (Signed)
Patient seen on 7/31 for lower abd pain and urinary frequency.  Urine culture collected at that time suggested a recollect.  Patient her for recollection for urine culture.

## 2021-07-08 LAB — URINE CULTURE: Culture: NO GROWTH

## 2021-08-05 ENCOUNTER — Ambulatory Visit (INDEPENDENT_AMBULATORY_CARE_PROVIDER_SITE_OTHER): Payer: 59 | Admitting: Family Medicine

## 2021-08-05 ENCOUNTER — Other Ambulatory Visit: Payer: Self-pay

## 2021-08-05 VITALS — BP 118/70 | HR 70 | Temp 98.0°F | Resp 14 | Ht 65.0 in | Wt 126.0 lb

## 2021-08-05 DIAGNOSIS — R5382 Chronic fatigue, unspecified: Secondary | ICD-10-CM | POA: Diagnosis not present

## 2021-08-05 DIAGNOSIS — R634 Abnormal weight loss: Secondary | ICD-10-CM | POA: Diagnosis not present

## 2021-08-05 DIAGNOSIS — Z8616 Personal history of COVID-19: Secondary | ICD-10-CM

## 2021-08-05 MED ORDER — TRIAMCINOLONE ACETONIDE 0.1 % EX CREA: 1.0000 "application " | TOPICAL_CREAM | Freq: Two times a day (BID) | CUTANEOUS | 0 refills | Status: DC
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
## 2021-08-06 LAB — CBC WITH DIFFERENTIAL/PLATELET
Absolute Monocytes: 531 cells/uL (ref 200–950)
Basophils Absolute: 62 cells/uL (ref 0–200)
Basophils Relative: 0.9 %
Eosinophils Absolute: 104 cells/uL (ref 15–500)
Eosinophils Relative: 1.5 %
HCT: 34.3 % — ABNORMAL LOW (ref 35.0–45.0)
Hemoglobin: 11.5 g/dL — ABNORMAL LOW (ref 11.7–15.5)
Lymphs Abs: 1946 cells/uL (ref 850–3900)
MCH: 31.1 pg (ref 27.0–33.0)
MCHC: 33.5 g/dL (ref 32.0–36.0)
MCV: 92.7 fL (ref 80.0–100.0)
MPV: 13.1 fL — ABNORMAL HIGH (ref 7.5–12.5)
Monocytes Relative: 7.7 %
Neutro Abs: 4257 cells/uL (ref 1500–7800)
Neutrophils Relative %: 61.7 %
Platelets: 173 10*3/uL (ref 140–400)
RBC: 3.7 10*6/uL — ABNORMAL LOW (ref 3.80–5.10)
RDW: 13.1 % (ref 11.0–15.0)
Total Lymphocyte: 28.2 %
WBC: 6.9 10*3/uL (ref 3.8–10.8)

## 2021-08-06 LAB — COMPLETE METABOLIC PANEL WITH GFR
AG Ratio: 1.7 (calc) (ref 1.0–2.5)
ALT: 12 U/L (ref 6–29)
AST: 11 U/L (ref 10–35)
Albumin: 4.3 g/dL (ref 3.6–5.1)
Alkaline phosphatase (APISO): 47 U/L (ref 31–125)
BUN: 14 mg/dL (ref 7–25)
CO2: 25 mmol/L (ref 20–32)
Calcium: 9.1 mg/dL (ref 8.6–10.2)
Chloride: 105 mmol/L (ref 98–110)
Creat: 0.79 mg/dL (ref 0.50–0.99)
Globulin: 2.5 g/dL (calc) (ref 1.9–3.7)
Glucose, Bld: 97 mg/dL (ref 65–99)
Potassium: 3.8 mmol/L (ref 3.5–5.3)
Sodium: 139 mmol/L (ref 135–146)
Total Bilirubin: 0.3 mg/dL (ref 0.2–1.2)
Total Protein: 6.8 g/dL (ref 6.1–8.1)
eGFR: 94 mL/min/{1.73_m2} (ref >=60)

## 2021-08-06 LAB — VITAMIN B12: Vitamin B-12: 291 pg/mL (ref 200–1100)

## 2021-08-06 LAB — TSH: TSH: 1.51 mIU/L

## 2021-08-09 ENCOUNTER — Other Ambulatory Visit: Payer: Self-pay | Admitting: *Deleted

## 2021-08-09 DIAGNOSIS — D649 Anemia, unspecified: Secondary | ICD-10-CM

## 2021-08-09 DIAGNOSIS — E539 Vitamin B deficiency, unspecified: Secondary | ICD-10-CM

## 2021-08-09 MED ORDER — VITAMIN B-12 1000 MCG PO TABS: 1000.0000 ug | ORAL_TABLET | Freq: Every day | ORAL | Status: DC

## 2021-08-18 ENCOUNTER — Ambulatory Visit
Admission: EM | Admit: 2021-08-18 | Discharge: 2021-08-18 | Disposition: A | Discharge status: Home / Self Care | Payer: 59 | Attending: Emergency Medicine | Admitting: Emergency Medicine

## 2021-08-18 ENCOUNTER — Encounter: Payer: Self-pay | Admitting: Emergency Medicine

## 2021-08-18 ENCOUNTER — Other Ambulatory Visit: Payer: Self-pay

## 2021-08-18 DIAGNOSIS — R5383 Other fatigue: Secondary | ICD-10-CM

## 2021-08-18 DIAGNOSIS — R6889 Other general symptoms and signs: Secondary | ICD-10-CM

## 2021-08-18 MED ORDER — DICYCLOMINE HCL 20 MG PO TABS: 20.0000 mg | ORAL_TABLET | Freq: Two times a day (BID) | ORAL | 0 refills | Status: DC

## 2021-08-18 NOTE — ED Triage Notes (Signed)
Nausea, cold chills , fatigue and decreased appetite. S/s started Sunday.  Had covid in July.

## 2021-08-18 NOTE — Discharge Instructions (Addendum)
COVID testing ordered.  It will take between 5-7 days for test results.  Someone will contact you regarding abnormal results.    In the meantime: You should remain isolated in your home for 5 days from symptom onset AND greater than 72 hours after symptoms resolution (absence of fever without the use of fever-reducing medication and improvement in respiratory symptoms), whichever is longer Get plenty of rest and push fluids Bentyl for abdominal pain Use OTC zyrtec for nasal congestion, runny nose, and/or sore throat Use OTC flonase for nasal congestion and runny nose Use medications daily for symptom relief Use OTC medications like ibuprofen or tylenol as needed fever or pain Call or go to the ED if you have any new or worsening symptoms such as fever, worsening cough, shortness of breath, chest tightness, chest pain, turning blue, changes in mental status, etc..Marland Kitchen
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
## 2021-08-19 LAB — COVID-19, FLU A+B NAA
Influenza A, NAA: NOT DETECTED
Influenza B, NAA: NOT DETECTED
SARS-CoV-2, NAA: NOT DETECTED

## 2021-09-22 ENCOUNTER — Ambulatory Visit: Payer: 59 | Admitting: Family Medicine

## 2021-09-23 ENCOUNTER — Other Ambulatory Visit: Payer: Self-pay

## 2021-09-23 ENCOUNTER — Ambulatory Visit (INDEPENDENT_AMBULATORY_CARE_PROVIDER_SITE_OTHER): Payer: 59 | Admitting: Family Medicine

## 2021-09-23 ENCOUNTER — Encounter: Payer: Self-pay | Admitting: Family Medicine

## 2021-09-23 VITALS — BP 120/78 | HR 83 | Ht 64.0 in | Wt 120.0 lb

## 2021-09-23 DIAGNOSIS — D649 Anemia, unspecified: Secondary | ICD-10-CM | POA: Diagnosis not present

## 2021-09-23 DIAGNOSIS — E539 Vitamin B deficiency, unspecified: Secondary | ICD-10-CM | POA: Diagnosis not present

## 2021-09-23 LAB — CBC WITH DIFFERENTIAL/PLATELET
Absolute Monocytes: 510 cells/uL (ref 200–950)
Basophils Absolute: 41 cells/uL (ref 0–200)
Basophils Relative: 0.6 %
Eosinophils Absolute: 82 cells/uL (ref 15–500)
Eosinophils Relative: 1.2 %
HCT: 38.1 % (ref 35.0–45.0)
Hemoglobin: 12.8 g/dL (ref 11.7–15.5)
Lymphs Abs: 1537 cells/uL (ref 850–3900)
MCH: 31.8 pg (ref 27.0–33.0)
MCHC: 33.6 g/dL (ref 32.0–36.0)
MCV: 94.8 fL (ref 80.0–100.0)
MPV: 12.7 fL — ABNORMAL HIGH (ref 7.5–12.5)
Monocytes Relative: 7.5 %
Neutro Abs: 4631 cells/uL (ref 1500–7800)
Neutrophils Relative %: 68.1 %
Platelets: 191 10*3/uL (ref 140–400)
RBC: 4.02 10*6/uL (ref 3.80–5.10)
RDW: 11.7 % (ref 11.0–15.0)
Total Lymphocyte: 22.6 %
WBC: 6.8 10*3/uL (ref 3.8–10.8)

## 2021-09-23 LAB — VITAMIN B12: Vitamin B-12: 610 pg/mL (ref 200–1100)
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
## 2021-11-03 ENCOUNTER — Other Ambulatory Visit: Payer: Self-pay | Admitting: Family Medicine

## 2021-11-03 ENCOUNTER — Telehealth: Payer: Self-pay

## 2021-11-03 MED ORDER — VALACYCLOVIR HCL 1 G PO TABS: 2000.0000 mg | ORAL_TABLET | Freq: Two times a day (BID) | ORAL | 5 refills | Status: AC

## 2021-11-03 NOTE — Telephone Encounter (Signed)
Per chart we have not rx'd this medication since 2015.  Please advise, thanks!

## 2021-11-03 NOTE — Telephone Encounter (Signed)
Pt called in asking for a new refill of valtrex for cold sores. Pt also stated that she would like for her meds to be sent the [facility] on [address].  Cb#: [phone]

## 2021-11-16 ENCOUNTER — Ambulatory Visit: Payer: 59 | Admitting: Nurse Practitioner

## 2021-12-13 DIAGNOSIS — R87610 Atypical squamous cells of undetermined significance on cytologic smear of cervix (ASC-US): Secondary | ICD-10-CM | POA: Diagnosis not present

## 2021-12-13 DIAGNOSIS — R69 Illness, unspecified: Secondary | ICD-10-CM | POA: Diagnosis not present

## 2022-01-01 DIAGNOSIS — S39011A Strain of muscle, fascia and tendon of abdomen, initial encounter: Secondary | ICD-10-CM | POA: Diagnosis not present

## 2022-01-12 ENCOUNTER — Other Ambulatory Visit: Payer: Self-pay

## 2022-01-12 ENCOUNTER — Encounter: Payer: Self-pay | Admitting: Family Medicine

## 2022-01-12 ENCOUNTER — Ambulatory Visit (INDEPENDENT_AMBULATORY_CARE_PROVIDER_SITE_OTHER): Payer: 59 | Admitting: Family Medicine

## 2022-01-12 VITALS — BP 138/88 | HR 88 | Temp 97.5°F | Resp 18 | Ht 64.0 in | Wt 114.0 lb

## 2022-01-12 DIAGNOSIS — L739 Follicular disorder, unspecified: Secondary | ICD-10-CM | POA: Diagnosis not present

## 2022-01-12 DIAGNOSIS — L281 Prurigo nodularis: Secondary | ICD-10-CM

## 2022-01-12 MED ORDER — DOXYCYCLINE HYCLATE 100 MG PO TABS: 100.0000 mg | ORAL_TABLET | Freq: Two times a day (BID) | ORAL | 0 refills | Status: DC
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
## 2022-01-22 ENCOUNTER — Other Ambulatory Visit: Payer: Self-pay

## 2022-01-22 ENCOUNTER — Encounter (HOSPITAL_BASED_OUTPATIENT_CLINIC_OR_DEPARTMENT_OTHER): Payer: Self-pay | Admitting: Emergency Medicine

## 2022-01-22 ENCOUNTER — Emergency Department (HOSPITAL_BASED_OUTPATIENT_CLINIC_OR_DEPARTMENT_OTHER)
Admission: EM | Admit: 2022-01-22 | Discharge: 2022-01-23 | Disposition: A | Discharge status: Home / Self Care | Payer: 59 | Source: Home / Self Care | Attending: Emergency Medicine | Admitting: Emergency Medicine

## 2022-01-22 DIAGNOSIS — N132 Hydronephrosis with renal and ureteral calculous obstruction: Secondary | ICD-10-CM | POA: Insufficient documentation

## 2022-01-22 DIAGNOSIS — N390 Urinary tract infection, site not specified: Secondary | ICD-10-CM

## 2022-01-22 DIAGNOSIS — R319 Hematuria, unspecified: Secondary | ICD-10-CM

## 2022-01-22 DIAGNOSIS — N202 Calculus of kidney with calculus of ureter: Secondary | ICD-10-CM | POA: Diagnosis not present

## 2022-01-22 DIAGNOSIS — Z91040 Latex allergy status: Secondary | ICD-10-CM | POA: Insufficient documentation

## 2022-01-22 DIAGNOSIS — Z8616 Personal history of COVID-19: Secondary | ICD-10-CM | POA: Insufficient documentation

## 2022-01-22 DIAGNOSIS — Z853 Personal history of malignant neoplasm of breast: Secondary | ICD-10-CM | POA: Insufficient documentation

## 2022-01-22 DIAGNOSIS — N83201 Unspecified ovarian cyst, right side: Secondary | ICD-10-CM

## 2022-01-22 DIAGNOSIS — N200 Calculus of kidney: Secondary | ICD-10-CM

## 2022-01-22 DIAGNOSIS — N3000 Acute cystitis without hematuria: Secondary | ICD-10-CM | POA: Diagnosis not present

## 2022-01-22 DIAGNOSIS — N1330 Unspecified hydronephrosis: Secondary | ICD-10-CM | POA: Diagnosis not present

## 2022-01-22 DIAGNOSIS — N83291 Other ovarian cyst, right side: Secondary | ICD-10-CM | POA: Diagnosis not present

## 2022-01-22 DIAGNOSIS — R1084 Generalized abdominal pain: Secondary | ICD-10-CM | POA: Diagnosis not present

## 2022-01-22 DIAGNOSIS — Z9882 Breast implant status: Secondary | ICD-10-CM | POA: Diagnosis not present

## 2022-01-22 DIAGNOSIS — K219 Gastro-esophageal reflux disease without esophagitis: Secondary | ICD-10-CM | POA: Diagnosis not present

## 2022-01-22 DIAGNOSIS — Z87891 Personal history of nicotine dependence: Secondary | ICD-10-CM | POA: Diagnosis not present

## 2022-01-22 DIAGNOSIS — R8279 Other abnormal findings on microbiological examination of urine: Secondary | ICD-10-CM | POA: Diagnosis not present

## 2022-01-22 DIAGNOSIS — Z87442 Personal history of urinary calculi: Secondary | ICD-10-CM | POA: Diagnosis not present

## 2022-01-22 DIAGNOSIS — Z90710 Acquired absence of both cervix and uterus: Secondary | ICD-10-CM | POA: Diagnosis not present

## 2022-01-22 LAB — URINALYSIS, ROUTINE W REFLEX MICROSCOPIC
Bilirubin Urine: NEGATIVE
Glucose, UA: NEGATIVE mg/dL
Ketones, ur: 15 mg/dL — AB
Leukocytes,Ua: NEGATIVE
Nitrite: NEGATIVE
Protein, ur: NEGATIVE mg/dL
Specific Gravity, Urine: 1.02 (ref 1.005–1.030)
pH: 7 (ref 5.0–8.0)

## 2022-01-22 LAB — URINALYSIS, MICROSCOPIC (REFLEX): RBC / HPF: >50 RBC/hpf (ref 0–5)

## 2022-01-22 MED ORDER — PROMETHAZINE HCL 25 MG/ML IJ SOLN
INTRAMUSCULAR | Status: AC
  Filled 2022-01-22: qty 1

## 2022-01-22 MED ORDER — SODIUM CHLORIDE 0.9 % IV SOLN
12.5000 mg | Freq: Four times a day (QID) | INTRAVENOUS | Status: DC | PRN
  Administered 2022-01-23: 12.5 mg via INTRAVENOUS
  Filled 2022-01-22: qty 0.5

## 2022-01-22 MED ORDER — KETOROLAC TROMETHAMINE 30 MG/ML IJ SOLN
30.0000 mg | Freq: Once | INTRAMUSCULAR | Status: AC
  Administered 2022-01-23: 30 mg via INTRAVENOUS
  Filled 2022-01-22: qty 1

## 2022-01-22 NOTE — ED Notes (Signed)
Nausea meds offered to patient.  Refused.  States only if you have phenergan.  Explained we can't give phenergan from triage.

## 2022-01-22 NOTE — ED Triage Notes (Signed)
Reports R flank pain that started today.  Also reports she is nauseated and vomited X 1.

## 2022-01-23 ENCOUNTER — Inpatient Hospital Stay (HOSPITAL_BASED_OUTPATIENT_CLINIC_OR_DEPARTMENT_OTHER): Payer: 59 | Admitting: Anesthesiology

## 2022-01-23 ENCOUNTER — Inpatient Hospital Stay (HOSPITAL_COMMUNITY): Payer: 59

## 2022-01-23 ENCOUNTER — Encounter (HOSPITAL_COMMUNITY)
Admission: AD | Disposition: A | Discharge status: Home / Self Care | Payer: Self-pay | Source: Ambulatory Visit | Attending: Urology

## 2022-01-23 ENCOUNTER — Ambulatory Visit (HOSPITAL_COMMUNITY)
Admission: AD | Admit: 2022-01-23 | Discharge: 2022-01-23 | Disposition: A | Discharge status: Home / Self Care | Payer: 59 | Source: Ambulatory Visit | Attending: Urology | Admitting: Urology

## 2022-01-23 ENCOUNTER — Inpatient Hospital Stay (HOSPITAL_COMMUNITY): Payer: 59 | Admitting: Anesthesiology

## 2022-01-23 ENCOUNTER — Encounter (HOSPITAL_COMMUNITY): Payer: Self-pay | Admitting: Urology

## 2022-01-23 ENCOUNTER — Emergency Department (HOSPITAL_BASED_OUTPATIENT_CLINIC_OR_DEPARTMENT_OTHER): Payer: 59

## 2022-01-23 ENCOUNTER — Other Ambulatory Visit: Payer: Self-pay | Admitting: Family Medicine

## 2022-01-23 ENCOUNTER — Other Ambulatory Visit: Payer: Self-pay | Admitting: Urology

## 2022-01-23 ENCOUNTER — Telehealth: Payer: Self-pay | Admitting: Family Medicine

## 2022-01-23 DIAGNOSIS — Z8616 Personal history of COVID-19: Secondary | ICD-10-CM | POA: Insufficient documentation

## 2022-01-23 DIAGNOSIS — B961 Klebsiella pneumoniae [K. pneumoniae] as the cause of diseases classified elsewhere: Secondary | ICD-10-CM | POA: Diagnosis not present

## 2022-01-23 DIAGNOSIS — N3000 Acute cystitis without hematuria: Secondary | ICD-10-CM | POA: Insufficient documentation

## 2022-01-23 DIAGNOSIS — N132 Hydronephrosis with renal and ureteral calculous obstruction: Secondary | ICD-10-CM | POA: Insufficient documentation

## 2022-01-23 DIAGNOSIS — N201 Calculus of ureter: Secondary | ICD-10-CM | POA: Diagnosis not present

## 2022-01-23 DIAGNOSIS — N83201 Unspecified ovarian cyst, right side: Secondary | ICD-10-CM | POA: Insufficient documentation

## 2022-01-23 DIAGNOSIS — Z853 Personal history of malignant neoplasm of breast: Secondary | ICD-10-CM | POA: Insufficient documentation

## 2022-01-23 DIAGNOSIS — N1330 Unspecified hydronephrosis: Secondary | ICD-10-CM | POA: Diagnosis not present

## 2022-01-23 DIAGNOSIS — R8279 Other abnormal findings on microbiological examination of urine: Secondary | ICD-10-CM | POA: Diagnosis not present

## 2022-01-23 DIAGNOSIS — Z91040 Latex allergy status: Secondary | ICD-10-CM | POA: Insufficient documentation

## 2022-01-23 DIAGNOSIS — Z87891 Personal history of nicotine dependence: Secondary | ICD-10-CM | POA: Insufficient documentation

## 2022-01-23 DIAGNOSIS — R1084 Generalized abdominal pain: Secondary | ICD-10-CM | POA: Diagnosis not present

## 2022-01-23 DIAGNOSIS — N390 Urinary tract infection, site not specified: Secondary | ICD-10-CM | POA: Diagnosis not present

## 2022-01-23 DIAGNOSIS — Z87442 Personal history of urinary calculi: Secondary | ICD-10-CM | POA: Insufficient documentation

## 2022-01-23 DIAGNOSIS — N202 Calculus of kidney with calculus of ureter: Secondary | ICD-10-CM | POA: Diagnosis not present

## 2022-01-23 DIAGNOSIS — K219 Gastro-esophageal reflux disease without esophagitis: Secondary | ICD-10-CM | POA: Insufficient documentation

## 2022-01-23 DIAGNOSIS — Z90710 Acquired absence of both cervix and uterus: Secondary | ICD-10-CM | POA: Diagnosis not present

## 2022-01-23 DIAGNOSIS — Z9882 Breast implant status: Secondary | ICD-10-CM | POA: Diagnosis not present

## 2022-01-23 HISTORY — PX: CYSTOSCOPY W/ URETERAL STENT PLACEMENT: SHX1429

## 2022-01-23 LAB — CBC WITH DIFFERENTIAL/PLATELET
Abs Immature Granulocytes: 0.06 10*3/uL (ref 0.00–0.07)
Basophils Absolute: 0 10*3/uL (ref 0.0–0.1)
Basophils Relative: 0 %
Eosinophils Absolute: 0 10*3/uL (ref 0.0–0.5)
Eosinophils Relative: 0 %
HCT: 41 % (ref 36.0–46.0)
Hemoglobin: 13.9 g/dL (ref 12.0–15.0)
Immature Granulocytes: 1 %
Lymphocytes Relative: 7 %
Lymphs Abs: 0.8 10*3/uL (ref 0.7–4.0)
MCH: 32 pg (ref 26.0–34.0)
MCHC: 33.9 g/dL (ref 30.0–36.0)
MCV: 94.5 fL (ref 80.0–100.0)
Monocytes Absolute: 0.3 10*3/uL (ref 0.1–1.0)
Monocytes Relative: 3 %
Neutro Abs: 10.3 10*3/uL — ABNORMAL HIGH (ref 1.7–7.7)
Neutrophils Relative %: 89 %
Platelets: 182 10*3/uL (ref 150–400)
RBC: 4.34 MIL/uL (ref 3.87–5.11)
RDW: 13 % (ref 11.5–15.5)
WBC: 11.5 10*3/uL — ABNORMAL HIGH (ref 4.0–10.5)
nRBC: 0 % (ref 0.0–0.2)

## 2022-01-23 LAB — BASIC METABOLIC PANEL
Anion gap: 9 (ref 5–15)
BUN: 13 mg/dL (ref 6–20)
CO2: 27 mmol/L (ref 22–32)
Calcium: 9.5 mg/dL (ref 8.9–10.3)
Chloride: 101 mmol/L (ref 98–111)
Creatinine, Ser: 0.79 mg/dL (ref 0.44–1.00)
GFR, Estimated: >60 mL/min (ref >60)
Glucose, Bld: 110 mg/dL — ABNORMAL HIGH (ref 70–99)
Potassium: 3.3 mmol/L — ABNORMAL LOW (ref 3.5–5.1)
Sodium: 137 mmol/L (ref 135–145)

## 2022-01-23 SURGERY — CYSTOSCOPY, FLEXIBLE, WITH STENT REPLACEMENT
Anesthesia: General | Laterality: Right

## 2022-01-23 MED ORDER — TAMSULOSIN HCL 0.4 MG PO CAPS
0.4000 mg | ORAL_CAPSULE | ORAL | Status: AC
  Administered 2022-01-23: 0.4 mg via ORAL
  Filled 2022-01-23: qty 1

## 2022-01-23 MED ORDER — DEXAMETHASONE SODIUM PHOSPHATE 10 MG/ML IJ SOLN
INTRAMUSCULAR | Status: AC
  Filled 2022-01-23: qty 1

## 2022-01-23 MED ORDER — GENTAMICIN SULFATE 40 MG/ML IJ SOLN
5.0000 mg/kg | Freq: Once | INTRAVENOUS | Status: AC
  Administered 2022-01-23: 260 mg via INTRAVENOUS
  Filled 2022-01-23: qty 6.5

## 2022-01-23 MED ORDER — TAMSULOSIN HCL 0.4 MG PO CAPS: 0.4000 mg | ORAL_CAPSULE | Freq: Every day | ORAL | 0 refills | Status: DC

## 2022-01-23 MED ORDER — LACTATED RINGERS IV SOLN: INTRAVENOUS | Status: DC

## 2022-01-23 MED ORDER — DEXAMETHASONE SODIUM PHOSPHATE 10 MG/ML IJ SOLN
INTRAMUSCULAR | Status: DC | PRN
  Administered 2022-01-23: 10 mg via INTRAVENOUS

## 2022-01-23 MED ORDER — TRAMADOL HCL 50 MG PO TABS
ORAL_TABLET | ORAL | Status: AC
  Filled 2022-01-23: qty 2

## 2022-01-23 MED ORDER — TRAMADOL HCL 50 MG PO TABS: 50.0000 mg | ORAL_TABLET | Freq: Four times a day (QID) | ORAL | 0 refills | Status: DC | PRN

## 2022-01-23 MED ORDER — CLINDAMYCIN PHOSPHATE 1 % EX SOLN: Freq: Two times a day (BID) | CUTANEOUS | 1 refills | Status: DC

## 2022-01-23 MED ORDER — FENTANYL CITRATE (PF) 100 MCG/2ML IJ SOLN
INTRAMUSCULAR | Status: AC
  Filled 2022-01-23: qty 2

## 2022-01-23 MED ORDER — MIDAZOLAM HCL 2 MG/2ML IJ SOLN
INTRAMUSCULAR | Status: AC
  Filled 2022-01-23: qty 2

## 2022-01-23 MED ORDER — ACETAMINOPHEN 10 MG/ML IV SOLN
INTRAVENOUS | Status: AC
  Filled 2022-01-23: qty 100

## 2022-01-23 MED ORDER — METOCLOPRAMIDE HCL 10 MG PO TABS: 10.0000 mg | ORAL_TABLET | Freq: Three times a day (TID) | ORAL | 0 refills | Status: DC | PRN

## 2022-01-23 MED ORDER — ONDANSETRON HCL 4 MG/2ML IJ SOLN
INTRAMUSCULAR | Status: DC | PRN
  Administered 2022-01-23: 4 mg via INTRAVENOUS

## 2022-01-23 MED ORDER — NITROFURANTOIN MONOHYD MACRO 100 MG PO CAPS
100.0000 mg | ORAL_CAPSULE | Freq: Once | ORAL | Status: AC
  Administered 2022-01-23: 100 mg via ORAL
  Filled 2022-01-23: qty 1

## 2022-01-23 MED ORDER — KETOROLAC TROMETHAMINE 30 MG/ML IJ SOLN
INTRAMUSCULAR | Status: AC
  Filled 2022-01-23: qty 1

## 2022-01-23 MED ORDER — PROPOFOL 10 MG/ML IV BOLUS
INTRAVENOUS | Status: DC | PRN
  Administered 2022-01-23: 30 mg via INTRAVENOUS
  Administered 2022-01-23: 130 mg via INTRAVENOUS
  Administered 2022-01-23: 40 mg via INTRAVENOUS

## 2022-01-23 MED ORDER — IOHEXOL 300 MG/ML  SOLN
INTRAMUSCULAR | Status: DC | PRN
  Administered 2022-01-23: 9 mL via URETHRAL

## 2022-01-23 MED ORDER — PHENAZOPYRIDINE HCL 200 MG PO TABS: 200.0000 mg | ORAL_TABLET | Freq: Three times a day (TID) | ORAL | 0 refills | Status: DC | PRN

## 2022-01-23 MED ORDER — HYDROMORPHONE HCL 1 MG/ML IJ SOLN: 0.2500 mg | INTRAMUSCULAR | Status: DC | PRN

## 2022-01-23 MED ORDER — MEPERIDINE HCL 50 MG/ML IJ SOLN: 6.2500 mg | INTRAMUSCULAR | Status: DC | PRN

## 2022-01-23 MED ORDER — POTASSIUM CHLORIDE CRYS ER 20 MEQ PO TBCR
20.0000 meq | EXTENDED_RELEASE_TABLET | Freq: Once | ORAL | Status: AC
  Administered 2022-01-23: 20 meq via ORAL
  Filled 2022-01-23: qty 1

## 2022-01-23 MED ORDER — FENTANYL CITRATE (PF) 100 MCG/2ML IJ SOLN
INTRAMUSCULAR | Status: DC | PRN
  Administered 2022-01-23 (×2): 25 ug via INTRAVENOUS

## 2022-01-23 MED ORDER — TRAMADOL HCL 50 MG PO TABS
100.0000 mg | ORAL_TABLET | Freq: Once | ORAL | Status: AC
  Administered 2022-01-23: 100 mg via ORAL

## 2022-01-23 MED ORDER — KETOROLAC TROMETHAMINE 30 MG/ML IJ SOLN
30.0000 mg | Freq: Once | INTRAMUSCULAR | Status: AC
  Administered 2022-01-23: 30 mg via INTRAVENOUS

## 2022-01-23 MED ORDER — CHLORHEXIDINE GLUCONATE 0.12 % MT SOLN
15.0000 mL | Freq: Once | OROMUCOSAL | Status: AC
  Administered 2022-01-23: 15 mL via OROMUCOSAL

## 2022-01-23 MED ORDER — PROMETHAZINE HCL 25 MG/ML IJ SOLN: 6.2500 mg | INTRAMUSCULAR | Status: DC | PRN

## 2022-01-23 MED ORDER — OXYBUTYNIN CHLORIDE 5 MG PO TABS: 5.0000 mg | ORAL_TABLET | Freq: Three times a day (TID) | ORAL | 1 refills | Status: DC | PRN

## 2022-01-23 MED ORDER — MAGNESIUM SULFATE 2 GM/50ML IV SOLN
2.0000 g | Freq: Once | INTRAVENOUS | Status: AC
  Administered 2022-01-23: 2 g via INTRAVENOUS
  Filled 2022-01-23: qty 50

## 2022-01-23 MED ORDER — ACETAMINOPHEN 10 MG/ML IV SOLN
1000.0000 mg | Freq: Once | INTRAVENOUS | Status: DC | PRN
  Administered 2022-01-23: 1000 mg via INTRAVENOUS

## 2022-01-23 MED ORDER — PROPOFOL 10 MG/ML IV BOLUS
INTRAVENOUS | Status: AC
  Filled 2022-01-23: qty 20

## 2022-01-23 MED ORDER — SODIUM CHLORIDE 0.9 % IR SOLN
Status: DC | PRN
  Administered 2022-01-23: 3000 mL via INTRAVESICAL

## 2022-01-23 MED ORDER — MIDAZOLAM HCL 5 MG/5ML IJ SOLN
INTRAMUSCULAR | Status: DC | PRN
  Administered 2022-01-23: 2 mg via INTRAVENOUS

## 2022-01-23 MED ORDER — PROPOFOL 500 MG/50ML IV EMUL
INTRAVENOUS | Status: DC | PRN
  Administered 2022-01-23: 200 ug/kg/min via INTRAVENOUS

## 2022-01-23 MED ORDER — PROMETHAZINE HCL 25 MG/ML IJ SOLN
INTRAMUSCULAR | Status: AC
  Filled 2022-01-23: qty 1

## 2022-01-23 MED ORDER — NITROFURANTOIN MONOHYD MACRO 100 MG PO CAPS: 100.0000 mg | ORAL_CAPSULE | Freq: Two times a day (BID) | ORAL | 0 refills | Status: DC

## 2022-01-23 MED ORDER — LIDOCAINE 2% (20 MG/ML) 5 ML SYRINGE
INTRAMUSCULAR | Status: DC | PRN
  Administered 2022-01-23: 40 mg via INTRAVENOUS

## 2022-01-23 SURGICAL SUPPLY — 10 items
BAG URO CATCHER STRL LF (MISCELLANEOUS) ×2 IMPLANT
CATH URETL OPEN 5X70 (CATHETERS) ×2 IMPLANT
CLOTH BEACON ORANGE TIMEOUT ST (SAFETY) ×2 IMPLANT
GLOVE SURG ENC TEXT LTX SZ7.5 (GLOVE) ×6 IMPLANT
GOWN STRL REUS W/TWL XL LVL3 (GOWN DISPOSABLE) ×3 IMPLANT
GUIDEWIRE ZIPWRE .038 STRAIGHT (WIRE) ×2 IMPLANT
MANIFOLD NEPTUNE II (INSTRUMENTS) ×2 IMPLANT
PACK CYSTO (CUSTOM PROCEDURE TRAY) ×2 IMPLANT
STENT URET 6FRX24 CONTOUR (STENTS) ×1 IMPLANT
TUBING CONNECTING 10 (TUBING) ×2 IMPLANT
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
## 2022-01-23 NOTE — H&P (Signed)
Office Visit Report     01/23/2022   --------------------------------------------------------------------------------   Ashley [name]  MRN: [id number]  DOB: Apr 06, 1976, 46 year old Female   PRIMARY CARE:  Cletus Gash T. Dennard Schaumann, MD  REFERRING:  Cammie Mcgee. Dennard Schaumann, MD  PROVIDER:  [name], M.D.  TREATING:  Ellison Hughs, M.D.  LOCATION:  [facility] [phone]     --------------------------------------------------------------------------------   CC: I have pain in the flank.  HPI: Ashley [name] is a 46 year-old female established patient who is here for flank pain.  The problem is on the right side. Her pain started about approximately 01/20/2022. The pain is sharp. The pain is constant. The pain does radiate.   Pain medications< makes the pain better. Nothing causes the pain to become worse. She has not been treated with any pain medications.   She has had this same pain previously. She has had kidney stones.   -The patient was seen at [facility] this morning and was found to have a 4 mm obstructing right proximal ureteral calculus as well acute cystitis on urinalysis.     ALLERGIES: Aspirin - Hives Bactrim - Hives, Trouble Breathing Biaxin - Headache Ciprofloxacin - Nausea, Itching, Skin Rash Diphenhydramine - palpitations Doxycycline Hydrocodone - Vomiting Latex - Itching Levaquin - Hives Penicillin - Hives Prednisone - Other Reaction, tachycardia Vicodin - Vomiting, Nausea Zofran    MEDICATIONS: Omeprazole 40 mg capsule,delayed release  Birth Control  Ibu 800 mg tablet  Linzess  Maxalt 1 PO PRN  Promethazine Hcl  Propranolol Hcl 10 mg tablet  Rizatriptan 5 mg tablet  Tylenol Arthritis 650 mg tablet, extended release     GU PSH: Cystoscopy - 2018       PSH Notes: double mastectomy-2009  breast implants-2019     NON-GU PSH: Breast mastectomy Sinus Surgery.. - 2018     GU PMH: Abdominal Pain Unspec, Fairly new onset  abdominal pain, I see no urologic reason for this. - 02/02/2021 Microscopic hematuria (Improving, Chronic), UA today shows improvement in microscopic hematuria. Recheck urine 1 yr w/RUS and OV w/Dr. Jeffie Pollock - 2019 Pelvic/perineal pain (Chronic), Recommended bimanual exam for evaluation of pain. Pt refused. Recommend she can try 400 mg Ibuprofen before sexual intercourse. If pain persist or worsens she can f/u PRN - 2019 Microscopic hematuria, She has small bilateral renal stones but no other source of the hematuria is noted. - 2018, She has microhematuria with a history of stones on prior CT. She is getting a CT at [facility] this evening and I will have her return for possible cystoscopy., - 2018 Renal calculus - 2018 Subacute and chronic vaginitis, She bacteria in her urine today and this could be from a bacterial vaginosis or UTI. I will culture the urine and also give her metrogel. - 2018      PMH Notes: sent in consultation by Dr. Jenna Luo for microhematuria with 3-10 RBC's on 2 UA's in 12/17 and on 01/09/17. She has had no gross hematuria. She had a CT in 2012 that showed bilateral punctuate stones. She has had no gross hematuria. She has had some increased frequency and was seen in December for that but was not treated for a UTI but was treated for a yeast infection. She has mild, intermittent bilateral flank pain right > left. She has never passed a stone. She has had a couple of UTI's but no GU surgery. She is having some vaginal discharge. She is a former smoker but quit in  2005.   NON-GU PMH: Carpal tunnel syndrome, unspecified upper limb GERD Menstrual migraine, not intractable, without status migrainosus Migraine without aura, intractable, without status migrainosus    FAMILY HISTORY: Diabetes - Father Hypertension - Mother, Father   SOCIAL HISTORY: Marital Status: Single Preferred Language: English; Ethnicity: Not Hispanic Or Latino; Race: White Current Smoking Status: Patient does not  smoke anymore.   Tobacco Use Assessment Completed: Used Tobacco in last 30 days? Does not use smokeless tobacco. Does not drink anymore.  Does not use drugs. Does not drink caffeine. Patient's occupation Community education officer.     Notes: 1 son, 1 daughter    REVIEW OF SYSTEMS:    GU Review Female:   Patient denies frequent urination, hard to postpone urination, burning /pain with urination, get up at night to urinate, leakage of urine, stream starts and stops, trouble starting your stream, have to strain to urinate, and being pregnant.  Gastrointestinal (Upper):   Patient denies nausea, vomiting, and indigestion/ heartburn.  Gastrointestinal (Lower):   Patient denies diarrhea and constipation.  Constitutional:   Patient denies fever, night sweats, weight loss, and fatigue.  Skin:   Patient denies skin rash/ lesion and itching.  Eyes:   Patient denies blurred vision and double vision.  Ears/ Nose/ Throat:   Patient denies sinus problems and sore throat.  Hematologic/Lymphatic:   Patient denies swollen glands and easy bruising.  Cardiovascular:   Patient denies leg swelling and chest pains.  Respiratory:   Patient denies cough and shortness of breath.  Endocrine:   Patient denies excessive thirst.  Musculoskeletal:   Patient denies back pain and joint pain.  Neurological:   Patient denies headaches and dizziness.  Psychologic:   Patient denies depression and anxiety.   VITAL SIGNS:      01/23/2022 03:16 PM  BP 152/94 mmHg  Pulse 66 /min  Temperature 97.3 F / 36.2 C   MULTI-SYSTEM PHYSICAL EXAMINATION:    Constitutional: The patient is laying on the exam table and cannot get comfortable in any position.  Neurologic / Psychiatric: Oriented to time, oriented to place, oriented to person. No depression, no anxiety, no agitation.  Musculoskeletal: Normal gait and station of head and neck.     Complexity of Data:  Lab Test Review:   BMP, CBC with Diff  Records Review:   Previous Patient  Records  X-Ray Review: C.T. Abdomen/Pelvis: Reviewed Films. Reviewed Report. Discussed With Patient.    Notes:                     CLINICAL DATA: Right flank pain.     EXAM:  CT ABDOMEN AND PELVIS WITHOUT CONTRAST     TECHNIQUE:  Multidetector CT imaging of the abdomen and pelvis was performed  following the standard protocol without IV contrast.     RADIATION DOSE REDUCTION: This exam was performed according to the  departmental dose-optimization program which includes automated  exposure control, adjustment of the mA and/or kV according to  patient size and/or use of iterative reconstruction technique.     COMPARISON: February 02, 2021     FINDINGS:  Lower chest: Bilateral breast implants are seen.     Hepatobiliary: A 6 mm cystic appearing area is again seen within the  posteromedial aspect of the right lobe of the liver. No gallstones,  gallbladder wall thickening, or biliary dilatation.     Pancreas: Unremarkable. No pancreatic ductal dilatation or  surrounding inflammatory changes.     Spleen: Normal in  size without focal abnormality.     Adrenals/Urinary Tract: Adrenal glands are unremarkable. Kidneys are  normal in size. A 4 mm obstructing renal calculus is seen within the  mid right ureter, with mild right-sided hydronephrosis. A 2 mm  nonobstructing renal calculus is seen within the mid to upper left  kidney. The urinary bladder is empty and subsequently limited in  evaluation.     Stomach/Bowel: Stomach is within normal limits. Appendix appears  normal. No evidence of bowel wall thickening, distention, or  inflammatory changes.     Vascular/Lymphatic: No significant vascular findings are present. No  enlarged abdominal or pelvic lymph nodes.     Reproductive: Status post hysterectomy.     A 2.3 cm diameter cyst is seen along the posterior aspect of the  right adnexa. A round, approximately 3.6 cm diameter area of soft  tissue attenuation is seen within the  anterior aspect of the left  adnexa (axial CT image 54, CT series 2).     Other: No abdominal wall hernia or abnormality. No abdominopelvic  ascites.     Musculoskeletal: No acute or significant osseous findings.     IMPRESSION:  1. 4 mm obstructing renal calculus within the mid right ureter, with  mild right-sided hydronephrosis.  2. 2.3 cm diameter right adnexal cyst, likely ovarian in origin.  3. Rounded, approximately 3.6 cm diameter area of soft tissue  attenuation within the anterior aspect of the left adnexa.  Correlation with nonemergent pelvic ultrasound is recommended to  exclude the presence of a left ovarian mass.        Electronically Signed  By: Virgina Norfolk M.D.  On: 01/23/2022 00:47   PROCEDURES: None   ASSESSMENT:      ICD-10 Details  1 GU:   Flank Pain - R10.84 Right, Undiagnosed New Problem  2   Ureteral calculus - N20.1 Right, Undiagnosed New Problem  3   Ureteral obstruction secondary to calculous - N13.2 Right, Undiagnosed New Problem  4 NON-GU:   Pyuria/other UA findings - R82.79 Undiagnosed New Problem   PLAN:           Orders Labs Urine Culture          Schedule Return Visit/Planned Activity: Next Available Appointment - Schedule Surgery          Document Letter(s):  Created for Patient: Clinical Summary         Notes:   -The risks, benefits and alternatives of cystoscopy with RIGHT JJ stent placement was discussed with the patient. Risks include, but are not limited to: bleeding, urinary tract infection, ureteral injury, ureteral stricture disease, chronic pain, urinary symptoms, bladder injury, stent migration, the need for nephrostomy tube placement, MI, CVA, DVT, PE and the inherent risks with general anesthesia. The patient voices understanding and wishes to proceed.

## 2022-01-23 NOTE — Anesthesia Procedure Notes (Signed)
Procedure Name: LMA Insertion Date/Time: 01/23/2022 6:58 PM Performed by: Montel Clock, CRNA Pre-anesthesia Checklist: Patient identified, Emergency Drugs available, Suction available, Patient being monitored and Timeout performed Patient Re-evaluated:Patient Re-evaluated prior to induction Oxygen Delivery Method: Circle system utilized Preoxygenation: Pre-oxygenation with 100% oxygen Induction Type: IV induction Ventilation: Mask ventilation without difficulty LMA: LMA with gastric port inserted LMA Size: 4.0 Number of attempts: 1 Dental Injury: Teeth and Oropharynx as per pre-operative assessment

## 2022-01-23 NOTE — Anesthesia Preprocedure Evaluation (Addendum)
Anesthesia Evaluation  Patient identified by MRN, date of birth, ID band Patient awake    Reviewed: Allergy & Precautions, NPO status , Patient's Chart, lab work & pertinent test results, reviewed documented beta blocker date and time   History of Anesthesia Complications (+) PONV and history of anesthetic complications  Airway Mallampati: I  TM Distance: >3 FB Neck ROM: Full    Dental no notable dental hx. (+) Dental Advisory Given   Pulmonary neg pulmonary ROS, former smoker,    Pulmonary exam normal breath sounds clear to auscultation       Cardiovascular Pt. on home beta blockers Normal cardiovascular exam Rhythm:Regular Rate:Normal     Neuro/Psych  Headaches, Anxiety    GI/Hepatic Neg liver ROS, GERD  ,  Endo/Other  negative endocrine ROS  Renal/GU negative Renal ROS     Musculoskeletal negative musculoskeletal ROS (+)   Abdominal   Peds  Hematology  (+) Blood dyscrasia, anemia ,   Anesthesia Other Findings   Reproductive/Obstetrics                            Anesthesia Physical Anesthesia Plan  ASA: 2 and emergent  Anesthesia Plan: General   Post-op Pain Management: Ofirmev IV (intra-op)* and Minimal or no pain anticipated   Induction: Intravenous  PONV Risk Score and Plan: 4 or greater and Treatment may vary due to age or medical condition, Ondansetron, Dexamethasone, Midazolam, Propofol infusion and TIVA  Airway Management Planned: LMA  Additional Equipment:   Intra-op Plan:   Post-operative Plan: Extubation in OR  Informed Consent: I have reviewed the patients History and Physical, chart, labs and discussed the procedure including the risks, benefits and alternatives for the proposed anesthesia with the patient or authorized representative who has indicated his/her understanding and acceptance.     Dental advisory given  Plan Discussed with: CRNA  Anesthesia  Plan Comments:       Anesthesia Quick Evaluation

## 2022-01-23 NOTE — Telephone Encounter (Signed)
Patient called to report she took all of the medication prescribed for the rash (doxycycline (VIBRA-TABS) 100 MG tablet); rash didn't go away.  Requesting a different medication.   Pharmacy confirmed as:  [facility]  [place], [place] [address]  Phone:  [phone]  Fax:  [phone]   Please advise at [phone].

## 2022-01-23 NOTE — Transfer of Care (Signed)
Immediate Anesthesia Transfer of Care Note  Patient: Ashley [name]  Procedure(s) Performed: CYSTOSCOPY WITH STENT REPLACEMENT, RETROGRADE (Right)  Patient Location: PACU  Anesthesia Type:General  Level of Consciousness: drowsy and patient cooperative  Airway & Oxygen Therapy: Patient Spontanous Breathing and Patient connected to face mask oxygen  Post-op Assessment: Report given to RN and Post -op Vital signs reviewed and stable  Post vital signs: Reviewed and stable  Last Vitals:  Vitals Value Taken Time  BP    Temp    Pulse    Resp    SpO2      Last Pain:  Vitals:   01/23/22 1728  TempSrc: Oral  PainSc: 6          Complications: No notable events documented.

## 2022-01-23 NOTE — Discharge Instructions (Addendum)
You must follow up with GYN for pelvic ultrasound related to left 3.6 cm adnexal lesion seen on CT scan

## 2022-01-23 NOTE — Anesthesia Postprocedure Evaluation (Signed)
Anesthesia Post Note  Patient: Ashley [name]  Procedure(s) Performed: CYSTOSCOPY WITH STENT REPLACEMENT, RETROGRADE (Right)     Patient location during evaluation: PACU Anesthesia Type: General Level of consciousness: sedated and patient cooperative Pain management: pain level controlled Vital Signs Assessment: post-procedure vital signs reviewed and stable Respiratory status: spontaneous breathing Cardiovascular status: stable Anesthetic complications: no   No notable events documented.  Last Vitals:  Vitals:   01/23/22 2000 01/23/22 2045  BP: 133/75 136/85  Pulse: 77 82  Resp: (!) 24   Temp:  36.4 C  SpO2: 100% 100%    Last Pain:  Vitals:   01/23/22 2045  TempSrc:   PainSc: [place]

## 2022-01-23 NOTE — Op Note (Signed)
Operative Note  Preoperative diagnosis:  1.  Obstructing 4 mm right ureteral stone 2.  Acute cystitis  Postoperative diagnosis: Same  Procedure(s): 1.  Cystoscopy with right ureteral stent placement 2.  Right retrograde pyelogram with intraoperative interpretation of fluoroscopic imaging  Surgeon: Ellison Hughs, MD  Assistants:  None  Anesthesia:  General  Complications:  None  EBL: Less than 5 mL  Specimens: 1.  None  Drains/Catheters: 1.  Right 6 French, 24 cm JJ stent without tether  Intraoperative findings:   Solitary right collecting system with no filling defects or dilation involving the right ureter or right renal pelvis seen on retrograde pyelogram   Indication:  Ashley [name] is a 46 y.o. female with an obstructing 4 mm right ureteral calculus along with urinalysis findings concerning for acute cystitis.  She has been consented for the above procedures, voices understanding and wishes to proceed.  Description of procedure:  After informed consent was obtained, the patient was brought to the operating room and general LMA anesthesia was administered. The patient was then placed in the dorsolithotomy position and prepped and draped in the usual sterile fashion. A timeout was performed. A 23 French rigid cystoscope was then inserted into the urethral meatus and advanced into the bladder under direct vision. A complete bladder survey revealed no intravesical pathology.  A 5 French ureteral catheter was then inserted into the right ureteral orifice and a retrograde pyelogram was obtained, with the findings listed above.  A Glidewire was then used to intubate the lumen of the ureteral catheter and was advanced up to the right renal pelvis, under fluoroscopic guidance.  The catheter was then removed, leaving the wire in place.  A 6 French, 24 cm JJ stent was then advanced over the wire and into good position within the right collecting system, confirming placement  via fluoroscopy.  The patient's bladder was drained.  She tolerated the procedure well and was transferred to the postanesthesia in stable condition.  Plan: Follow-up with Dr. Jeffie Pollock to plan definitive stone treatment

## 2022-01-23 NOTE — ED Notes (Signed)
Patient assisted to restroom via wheelchair

## 2022-01-24 ENCOUNTER — Encounter (HOSPITAL_COMMUNITY): Payer: Self-pay | Admitting: Urology

## 2022-01-24 NOTE — Telephone Encounter (Signed)
Spoke with patient and advised. She will let us know if areas are not healing after this medication. Nothing further needed at this time.

## 2022-01-26 ENCOUNTER — Other Ambulatory Visit: Payer: Self-pay | Admitting: Urology

## 2022-01-30 ENCOUNTER — Encounter (HOSPITAL_COMMUNITY): Payer: Self-pay | Admitting: Urology

## 2022-01-30 ENCOUNTER — Other Ambulatory Visit: Payer: Self-pay

## 2022-01-30 ENCOUNTER — Encounter (HOSPITAL_COMMUNITY)
Admission: RE | Admit: 2022-01-30 | Discharge: 2022-01-30 | Disposition: A | Discharge status: Home / Self Care | Payer: 59 | Source: Ambulatory Visit | Attending: Urology | Admitting: Urology

## 2022-01-30 VITALS — BP 115/86 | HR 89 | Temp 98.3°F | Resp 16 | Ht 64.0 in | Wt 111.0 lb

## 2022-01-30 DIAGNOSIS — Z01818 Encounter for other preprocedural examination: Secondary | ICD-10-CM | POA: Insufficient documentation

## 2022-01-30 LAB — CBC
HCT: 40 % (ref 36.0–46.0)
Hemoglobin: 12.8 g/dL (ref 12.0–15.0)
MCH: 31.7 pg (ref 26.0–34.0)
MCHC: 32 g/dL (ref 30.0–36.0)
MCV: 99 fL (ref 80.0–100.0)
Platelets: 157 10*3/uL (ref 150–400)
RBC: 4.04 MIL/uL (ref 3.87–5.11)
RDW: 12.6 % (ref 11.5–15.5)
WBC: 8.2 10*3/uL (ref 4.0–10.5)
nRBC: 0 % (ref 0.0–0.2)

## 2022-01-30 LAB — BASIC METABOLIC PANEL
Anion gap: 7 (ref 5–15)
BUN: 18 mg/dL (ref 6–20)
CO2: 23 mmol/L (ref 22–32)
Calcium: 9.2 mg/dL (ref 8.9–10.3)
Chloride: 107 mmol/L (ref 98–111)
Creatinine, Ser: 0.67 mg/dL (ref 0.44–1.00)
GFR, Estimated: >60 mL/min (ref >60)
Glucose, Bld: 92 mg/dL (ref 70–99)
Potassium: 4 mmol/L (ref 3.5–5.1)
Sodium: 137 mmol/L (ref 135–145)

## 2022-01-30 NOTE — Progress Notes (Addendum)
Anesthesia Review:  PCP: DR Jenna Luo LOV 01/12/22.  Cardiologist : Chest x-ray : EKG : Echo : Stress test: Cardiac Cath :  Activity level:  Sleep Study/ CPAP : Fasting Blood Sugar :      / Checks Blood Sugar -- times a day:   Blood Thinner/ Instructions /Last Dose: ASA / Instructions/ Last Dose :   IN ED on 01/22/22.  Discharged on 01/23/22.  White count was 11.5 on 01/23/22 and Potassium was 3.3 at that time.   Repeat labs done on 01/30/22 alogn with EKG.   PT came into preop on 01/30/22 and wanting surgery moved to before 02/03/22.  Informed pt that she would need to contact DR Jeffie Pollock at [facility].  PT voiced understanding.  Informed pt that preop nurse would also call [facility] [place] at [facility].   Called office of [facility] and [facility] for Darrel Reach in regards to this.   PT stated she also had questiones in regard to surgery .  Informed pt that she would need to discuss with DR Jeffie Pollock.  Pt did not sign consent at preop.  Informed pt again to call office of DR Jeffie Pollock and discuss with him.  PT voiced understanding.   Also left on the voice mail for pt that pt had questions for DR Jeffie Pollock.

## 2022-01-30 NOTE — Progress Notes (Signed)
Ashley [name]  01/30/2022   Your procedure is scheduled on:    02/03/22   Report to [facility] Main  Entrance   Report to admitting at    [phone]     Call this number if you have problems the morning of surgery [phone]    Remember: Do not eat food , candy gum or mints :After Midnight. You may have clear liquids from midnight until __   0745am    CLEAR LIQUID DIET   Foods Allowed                                                                       Coffee and tea, regular and decaf                              Plain Jell-O any favor except red or purple                                            Fruit ices (not with fruit pulp)                                      Iced Popsicles                                     Carbonated beverages, regular and diet                                    Cranberry, grape and apple juices Sports drinks like Gatorade Lightly seasoned clear broth or consume(fat free) Sugar   _____________________________________________________________________    BRUSH YOUR TEETH MORNING OF SURGERY AND RINSE YOUR MOUTH OUT, NO CHEWING GUM CANDY OR MINTS.     Take these medicines the morning of surgery with A SIP OF WATER:  propanolol, flomax   DO NOT TAKE ANY DIABETIC MEDICATIONS DAY OF YOUR SURGERY                               You may not have any metal on your body including hair pins and              piercings  Do not wear jewelry, make-up, lotions, powders or perfumes, deodorant             Do not wear nail polish on your fingernails.  Do not shave  48 hours prior to surgery.              Men may shave face and neck.   Do not bring valuables to the hospital. [place].  Contacts,  dentures or bridgework may not be worn into surgery.  Leave suitcase in the car. After surgery it may be brought to your room.     Patients discharged the day of surgery will not be allowed to  drive home. IF YOU ARE HAVING SURGERY AND GOING HOME THE SAME DAY, YOU MUST HAVE AN ADULT TO DRIVE YOU HOME AND BE WITH YOU FOR 24 HOURS. YOU MAY GO HOME BY TAXI OR UBER OR ORTHERWISE, BUT AN ADULT MUST ACCOMPANY YOU HOME AND STAY WITH YOU FOR 24 HOURS.  Name and phone number of your driver:  Special Instructions: N/A              Please read over the following fact sheets you were given: _____________________________________________________________________  [facility] - Preparing for Surgery Before surgery, you can play an important role.  Because skin is not sterile, your skin needs to be as free of germs as possible.  You can reduce the number of germs on your skin by washing with CHG (chlorahexidine gluconate) soap before surgery.  CHG is an antiseptic cleaner which kills germs and bonds with the skin to continue killing germs even after washing. Please DO NOT use if you have an allergy to CHG or antibacterial soaps.  If your skin becomes reddened/irritated stop using the CHG and inform your nurse when you arrive at Short Stay. Do not shave (including legs and underarms) for at least 48 hours prior to the first CHG shower.  You may shave your face/neck. Please follow these instructions carefully:  1.  Shower with CHG Soap the night before surgery and the  morning of Surgery.  2.  If you choose to wash your hair, wash your hair first as usual with your  normal  shampoo.  3.  After you shampoo, rinse your hair and body thoroughly to remove the  shampoo.                           4.  Use CHG as you would any other liquid soap.  You can apply chg directly  to the skin and wash                       Gently with a scrungie or clean washcloth.  5.  Apply the CHG Soap to your body ONLY FROM THE NECK DOWN.   Do not use on face/ open                           Wound or open sores. Avoid contact with eyes, ears mouth and genitals (private parts).                       Wash face,  Genitals (private parts)  with your normal soap.             6.  Wash thoroughly, paying special attention to the area where your surgery  will be performed.  7.  Thoroughly rinse your body with warm water from the neck down.  8.  DO NOT shower/wash with your normal soap after using and rinsing off  the CHG Soap.                9.  Pat yourself dry with a clean towel.            10.  Wear clean pajamas.  11.  Place clean sheets on your bed the night of your first shower and do not  sleep with pets. Day of Surgery : Do not apply any lotions/deodorants the morning of surgery.  Please wear clean clothes to the hospital/surgery center.  FAILURE TO FOLLOW THESE INSTRUCTIONS MAY RESULT IN THE CANCELLATION OF YOUR SURGERY PATIENT SIGNATURE_________________________________  NURSE SIGNATURE__________________________________  ________________________________________________________________________

## 2022-01-31 DIAGNOSIS — R102 Pelvic and perineal pain: Secondary | ICD-10-CM | POA: Diagnosis not present

## 2022-02-01 NOTE — H&P (Signed)
I have pain in the flank.  HPI: Ashley [name] is a 46 year-old female established patient who is here for flank pain.  The problem is on the right side. Her pain started about approximately 01/20/2022. The pain is sharp. The pain is constant. The pain does radiate.   Pain medications< makes the pain better. Nothing causes the pain to become worse. She has not been treated with any pain medications.   She has had this same pain previously. She has had kidney stones.   -The patient was seen at [facility] this morning and was found to have a 4 mm obstructing right proximal ureteral calculus as well acute cystitis on urinalysis.     ALLERGIES: Aspirin - Hives Bactrim - Hives, Trouble Breathing Biaxin - Headache Ciprofloxacin - Nausea, Itching, Skin Rash Diphenhydramine - palpitations Doxycycline Hydrocodone - Vomiting Latex - Itching Levaquin - Hives Penicillin - Hives Prednisone - Other Reaction, tachycardia Vicodin - Vomiting, Nausea Zofran    MEDICATIONS: Omeprazole 40 mg capsule,delayed release  Birth Control  Ibu 800 mg tablet  Linzess  Maxalt 1 PO PRN  Promethazine Hcl  Propranolol Hcl 10 mg tablet  Rizatriptan 5 mg tablet  Tylenol Arthritis 650 mg tablet, extended release     GU PSH: Cystoscopy - 2018       PSH Notes: double mastectomy-2009  breast implants-2019     NON-GU PSH: Breast mastectomy Sinus Surgery.. - 2018     GU PMH: Abdominal Pain Unspec, Fairly new onset abdominal pain, I see no urologic reason for this. - 02/02/2021 Microscopic hematuria (Improving, Chronic), UA today shows improvement in microscopic hematuria. Recheck urine 1 yr w/RUS and OV w/Dr. Jeffie Pollock - 2019 Pelvic/perineal pain (Chronic), Recommended bimanual exam for evaluation of pain. Pt refused. Recommend she can try 400 mg Ibuprofen before sexual intercourse. If pain persist or worsens she can f/u PRN - 2019 Microscopic hematuria, She has small bilateral renal stones but no  other source of the hematuria is noted. - 2018, She has microhematuria with a history of stones on prior CT. She is getting a CT at [facility] this evening and I will have her return for possible cystoscopy., - 2018 Renal calculus - 2018 Subacute and chronic vaginitis, She bacteria in her urine today and this could be from a bacterial vaginosis or UTI. I will culture the urine and also give her metrogel. - 2018      PMH Notes: sent in consultation by Dr. Jenna Luo for microhematuria with 3-10 RBC's on 2 UA's in 12/17 and on 01/09/17. She has had no gross hematuria. She had a CT in 2012 that showed bilateral punctuate stones. She has had no gross hematuria. She has had some increased frequency and was seen in December for that but was not treated for a UTI but was treated for a yeast infection. She has mild, intermittent bilateral flank pain right > left. She has never passed a stone. She has had a couple of UTI's but no GU surgery. She is having some vaginal discharge. She is a former smoker but quit in 2005.   NON-GU PMH: Carpal tunnel syndrome, unspecified upper limb GERD Menstrual migraine, not intractable, without status migrainosus Migraine without aura, intractable, without status migrainosus    FAMILY HISTORY: Diabetes - Father Hypertension - Mother, Father   SOCIAL HISTORY: Marital Status: Single Preferred Language: English; Ethnicity: Not Hispanic Or Latino; Race: White Current Smoking Status: Patient does not smoke anymore.   Tobacco Use Assessment Completed: Used  Tobacco in last 30 days? Does not use smokeless tobacco. Does not drink anymore.  Does not use drugs. Does not drink caffeine. Patient's occupation Community education officer.     Notes: 1 son, 1 daughter    REVIEW OF SYSTEMS:    GU Review Female:   Patient denies frequent urination, hard to postpone urination, burning /pain with urination, get up at night to urinate, leakage of urine, stream starts and stops, trouble starting your  stream, have to strain to urinate, and being pregnant.  Gastrointestinal (Upper):   Patient denies nausea, vomiting, and indigestion/ heartburn.  Gastrointestinal (Lower):   Patient denies diarrhea and constipation.  Constitutional:   Patient denies fever, night sweats, weight loss, and fatigue.  Skin:   Patient denies skin rash/ lesion and itching.  Eyes:   Patient denies blurred vision and double vision.  Ears/ Nose/ Throat:   Patient denies sinus problems and sore throat.  Hematologic/Lymphatic:   Patient denies swollen glands and easy bruising.  Cardiovascular:   Patient denies leg swelling and chest pains.  Respiratory:   Patient denies cough and shortness of breath.  Endocrine:   Patient denies excessive thirst.  Musculoskeletal:   Patient denies back pain and joint pain.  Neurological:   Patient denies headaches and dizziness.  Psychologic:   Patient denies depression and anxiety.   VITAL SIGNS:      01/23/2022 03:16 PM  BP 152/94 mmHg  Pulse 66 /min  Temperature 97.3 F / 36.2 C   MULTI-SYSTEM PHYSICAL EXAMINATION:    Constitutional: The patient is laying on the exam table and cannot get comfortable in any position.  Neurologic / Psychiatric: Oriented to time, oriented to place, oriented to person. No depression, no anxiety, no agitation.  Musculoskeletal: Normal gait and station of head and neck.     Complexity of Data:  Lab Test Review:   BMP, CBC with Diff  Records Review:   Previous Patient Records  X-Ray Review: C.T. Abdomen/Pelvis: Reviewed Films. Reviewed Report. Discussed With Patient.    Notes:                     CLINICAL DATA: Right flank pain.     EXAM:  CT ABDOMEN AND PELVIS WITHOUT CONTRAST     TECHNIQUE:  Multidetector CT imaging of the abdomen and pelvis was performed  following the standard protocol without IV contrast.     RADIATION DOSE REDUCTION: This exam was performed according to the  departmental dose-optimization program which includes  automated  exposure control, adjustment of the mA and/or kV according to  patient size and/or use of iterative reconstruction technique.     COMPARISON: February 02, 2021     FINDINGS:  Lower chest: Bilateral breast implants are seen.     Hepatobiliary: A 6 mm cystic appearing area is again seen within the  posteromedial aspect of the right lobe of the liver. No gallstones,  gallbladder wall thickening, or biliary dilatation.     Pancreas: Unremarkable. No pancreatic ductal dilatation or  surrounding inflammatory changes.     Spleen: Normal in size without focal abnormality.     Adrenals/Urinary Tract: Adrenal glands are unremarkable. Kidneys are  normal in size. A 4 mm obstructing renal calculus is seen within the  mid right ureter, with mild right-sided hydronephrosis. A 2 mm  nonobstructing renal calculus is seen within the mid to upper left  kidney. The urinary bladder is empty and subsequently limited in  evaluation.  Stomach/Bowel: Stomach is within normal limits. Appendix appears  normal. No evidence of bowel wall thickening, distention, or  inflammatory changes.     Vascular/Lymphatic: No significant vascular findings are present. No  enlarged abdominal or pelvic lymph nodes.     Reproductive: Status post hysterectomy.     A 2.3 cm diameter cyst is seen along the posterior aspect of the  right adnexa. A round, approximately 3.6 cm diameter area of soft  tissue attenuation is seen within the anterior aspect of the left  adnexa (axial CT image 54, CT series 2).     Other: No abdominal wall hernia or abnormality. No abdominopelvic  ascites.     Musculoskeletal: No acute or significant osseous findings.     IMPRESSION:  1. 4 mm obstructing renal calculus within the mid right ureter, with  mild right-sided hydronephrosis.  2. 2.3 cm diameter right adnexal cyst, likely ovarian in origin.  3. Rounded, approximately 3.6 cm diameter area of soft tissue  attenuation  within the anterior aspect of the left adnexa.  Correlation with nonemergent pelvic ultrasound is recommended to  exclude the presence of a left ovarian mass.        Electronically Signed  By: Virgina Norfolk M.D.  On: 01/23/2022 00:47   PROCEDURES: None   ASSESSMENT:      ICD-10 Details  1 GU:   Flank Pain - R10.84 Right, Undiagnosed New Problem  2   Ureteral calculus - N20.1 Right, Undiagnosed New Problem  3   Ureteral obstruction secondary to calculous - N13.2 Right, Undiagnosed New Problem  4 NON-GU:   Pyuria/other UA findings - R82.79 Undiagnosed New Problem   PLAN:           Orders Labs Urine Culture          Schedule Return Visit/Planned Activity: Next Available Appointment - Schedule Surgery          Document Letter(s):  Created for Patient: Clinical Summary         Notes:   -The risks, benefits and alternatives of cystoscopy with RIGHT JJ stent placement was discussed with the patient. Risks include, but are not limited to: bleeding, urinary tract infection, ureteral injury, ureteral stricture disease, chronic pain, urinary symptoms, bladder injury, stent migration, the need for nephrostomy tube placement, MI, CVA, DVT, PE and the inherent risks with general anesthesia. The patient voices understanding and wishes to proceed.   cc: Dr. Jeffie Pollock

## 2022-02-02 ENCOUNTER — Encounter (HOSPITAL_COMMUNITY): Payer: Self-pay | Admitting: Urology

## 2022-02-03 ENCOUNTER — Ambulatory Visit (HOSPITAL_BASED_OUTPATIENT_CLINIC_OR_DEPARTMENT_OTHER): Payer: 59 | Admitting: Anesthesiology

## 2022-02-03 ENCOUNTER — Encounter (HOSPITAL_COMMUNITY)
Admission: RE | Disposition: A | Discharge status: Home / Self Care | Payer: Self-pay | Source: Ambulatory Visit | Attending: Urology

## 2022-02-03 ENCOUNTER — Ambulatory Visit (HOSPITAL_COMMUNITY): Payer: 59

## 2022-02-03 ENCOUNTER — Ambulatory Visit (HOSPITAL_COMMUNITY): Payer: 59 | Admitting: Physician Assistant

## 2022-02-03 ENCOUNTER — Encounter (HOSPITAL_COMMUNITY): Payer: Self-pay | Admitting: Urology

## 2022-02-03 ENCOUNTER — Ambulatory Visit (HOSPITAL_COMMUNITY)
Admission: RE | Admit: 2022-02-03 | Discharge: 2022-02-03 | Disposition: A | Discharge status: Home / Self Care | Payer: 59 | Source: Ambulatory Visit | Attending: Urology | Admitting: Urology

## 2022-02-03 DIAGNOSIS — F419 Anxiety disorder, unspecified: Secondary | ICD-10-CM | POA: Insufficient documentation

## 2022-02-03 DIAGNOSIS — N132 Hydronephrosis with renal and ureteral calculous obstruction: Secondary | ICD-10-CM | POA: Diagnosis not present

## 2022-02-03 DIAGNOSIS — N136 Pyonephrosis: Secondary | ICD-10-CM | POA: Diagnosis not present

## 2022-02-03 DIAGNOSIS — R519 Headache, unspecified: Secondary | ICD-10-CM | POA: Diagnosis not present

## 2022-02-03 DIAGNOSIS — Z853 Personal history of malignant neoplasm of breast: Secondary | ICD-10-CM | POA: Insufficient documentation

## 2022-02-03 DIAGNOSIS — Z466 Encounter for fitting and adjustment of urinary device: Secondary | ICD-10-CM

## 2022-02-03 DIAGNOSIS — N201 Calculus of ureter: Secondary | ICD-10-CM

## 2022-02-03 DIAGNOSIS — K219 Gastro-esophageal reflux disease without esophagitis: Secondary | ICD-10-CM | POA: Insufficient documentation

## 2022-02-03 DIAGNOSIS — Z87891 Personal history of nicotine dependence: Secondary | ICD-10-CM | POA: Insufficient documentation

## 2022-02-03 DIAGNOSIS — R69 Illness, unspecified: Secondary | ICD-10-CM | POA: Diagnosis not present

## 2022-02-03 HISTORY — PX: CYSTOSCOPY/URETEROSCOPY/HOLMIUM LASER/STENT PLACEMENT: SHX6546

## 2022-02-03 SURGERY — CYSTOSCOPY/URETEROSCOPY/HOLMIUM LASER/STENT PLACEMENT
Anesthesia: General | Laterality: Right

## 2022-02-03 MED ORDER — CHLORHEXIDINE GLUCONATE 0.12 % MT SOLN: 15.0000 mL | Freq: Once | OROMUCOSAL | Status: DC

## 2022-02-03 MED ORDER — GENTAMICIN SULFATE 40 MG/ML IJ SOLN
5.0000 mg/kg | INTRAVENOUS | Status: AC
  Administered 2022-02-03: 250 mg via INTRAVENOUS
  Filled 2022-02-03: qty 6.25

## 2022-02-03 MED ORDER — MIDAZOLAM HCL 5 MG/5ML IJ SOLN
INTRAMUSCULAR | Status: DC | PRN
  Administered 2022-02-03: 2 mg via INTRAVENOUS

## 2022-02-03 MED ORDER — PROPOFOL 10 MG/ML IV BOLUS
INTRAVENOUS | Status: DC | PRN
  Administered 2022-02-03: 160 mg via INTRAVENOUS

## 2022-02-03 MED ORDER — ONDANSETRON HCL 4 MG/2ML IJ SOLN
INTRAMUSCULAR | Status: AC
  Filled 2022-02-03: qty 2

## 2022-02-03 MED ORDER — AMISULPRIDE (ANTIEMETIC) 5 MG/2ML IV SOLN: 10.0000 mg | Freq: Once | INTRAVENOUS | Status: DC | PRN

## 2022-02-03 MED ORDER — OXYBUTYNIN CHLORIDE 5 MG PO TABS
ORAL_TABLET | ORAL | Status: AC
  Administered 2022-02-03: 5 mg
  Filled 2022-02-03: qty 1

## 2022-02-03 MED ORDER — OXYBUTYNIN CHLORIDE 5 MG PO TABS: 5.0000 mg | ORAL_TABLET | Freq: Once | ORAL | Status: DC

## 2022-02-03 MED ORDER — TRAMADOL HCL 50 MG PO TABS: 100.0000 mg | ORAL_TABLET | Freq: Four times a day (QID) | ORAL | Status: DC

## 2022-02-03 MED ORDER — DEXAMETHASONE SODIUM PHOSPHATE 10 MG/ML IJ SOLN
INTRAMUSCULAR | Status: AC
  Filled 2022-02-03: qty 1

## 2022-02-03 MED ORDER — OXYBUTYNIN CHLORIDE 5 MG PO TABS: 5.0000 mg | ORAL_TABLET | Freq: Three times a day (TID) | ORAL | 1 refills | Status: DC | PRN

## 2022-02-03 MED ORDER — LIDOCAINE HCL (CARDIAC) PF 100 MG/5ML IV SOSY
PREFILLED_SYRINGE | INTRAVENOUS | Status: DC | PRN
  Administered 2022-02-03: 100 mg via INTRAVENOUS

## 2022-02-03 MED ORDER — OXYCODONE HCL 5 MG PO TABS: 5.0000 mg | ORAL_TABLET | Freq: Once | ORAL | Status: DC | PRN

## 2022-02-03 MED ORDER — FENTANYL CITRATE PF 50 MCG/ML IJ SOSY
PREFILLED_SYRINGE | INTRAMUSCULAR | Status: AC
  Filled 2022-02-03: qty 1

## 2022-02-03 MED ORDER — TRAMADOL HCL 50 MG PO TABS: 50.0000 mg | ORAL_TABLET | Freq: Four times a day (QID) | ORAL | 0 refills | Status: DC | PRN

## 2022-02-03 MED ORDER — SODIUM CHLORIDE 0.9% FLUSH: 3.0000 mL | Freq: Two times a day (BID) | INTRAVENOUS | Status: DC

## 2022-02-03 MED ORDER — MIDAZOLAM HCL 2 MG/2ML IJ SOLN
INTRAMUSCULAR | Status: AC
Start: 2022-02-03 — End: ?
  Filled 2022-02-03: qty 2

## 2022-02-03 MED ORDER — FENTANYL CITRATE (PF) 100 MCG/2ML IJ SOLN
INTRAMUSCULAR | Status: AC
  Filled 2022-02-03: qty 2

## 2022-02-03 MED ORDER — ORAL CARE MOUTH RINSE: 15.0000 mL | Freq: Once | OROMUCOSAL | Status: DC

## 2022-02-03 MED ORDER — PHENAZOPYRIDINE HCL 200 MG PO TABS: 200.0000 mg | ORAL_TABLET | Freq: Three times a day (TID) | ORAL | 1 refills | Status: DC | PRN

## 2022-02-03 MED ORDER — PROPOFOL 1000 MG/100ML IV EMUL
INTRAVENOUS | Status: AC
  Filled 2022-02-03: qty 100

## 2022-02-03 MED ORDER — FENTANYL CITRATE (PF) 100 MCG/2ML IJ SOLN
INTRAMUSCULAR | Status: DC | PRN
  Administered 2022-02-03: 50 ug via INTRAVENOUS

## 2022-02-03 MED ORDER — DEXAMETHASONE SODIUM PHOSPHATE 10 MG/ML IJ SOLN
INTRAMUSCULAR | Status: DC | PRN
  Administered 2022-02-03: 4 mg via INTRAVENOUS

## 2022-02-03 MED ORDER — LACTATED RINGERS IV SOLN: INTRAVENOUS | Status: DC

## 2022-02-03 MED ORDER — PROPOFOL 10 MG/ML IV BOLUS
INTRAVENOUS | Status: AC
  Filled 2022-02-03: qty 20

## 2022-02-03 MED ORDER — OXYCODONE HCL 5 MG/5ML PO SOLN: 5.0000 mg | Freq: Once | ORAL | Status: DC | PRN

## 2022-02-03 MED ORDER — FENTANYL CITRATE PF 50 MCG/ML IJ SOSY
25.0000 ug | PREFILLED_SYRINGE | INTRAMUSCULAR | Status: DC | PRN
  Administered 2022-02-03: 25 ug via INTRAVENOUS

## 2022-02-03 MED ORDER — PROPOFOL 500 MG/50ML IV EMUL
INTRAVENOUS | Status: DC | PRN
Start: 2022-02-03 — End: 2022-02-03
  Administered 2022-02-03: 130 ug/kg/min via INTRAVENOUS

## 2022-02-03 MED ORDER — LIDOCAINE HCL (PF) 2 % IJ SOLN
INTRAMUSCULAR | Status: AC
  Filled 2022-02-03: qty 5

## 2022-02-03 MED ORDER — SODIUM CHLORIDE 0.9 % IR SOLN
Status: DC | PRN
  Administered 2022-02-03: 6000 mL
  Administered 2022-02-03: 1000 mL

## 2022-02-03 SURGICAL SUPPLY — 22 items
BAG URO CATCHER STRL LF (MISCELLANEOUS) ×2 IMPLANT
BASKET STONE NCOMPASS (UROLOGICAL SUPPLIES) IMPLANT
CATH URETERAL DUAL LUMEN 10F (MISCELLANEOUS) IMPLANT
CATH URETL OPEN 5X70 (CATHETERS) IMPLANT
CLOTH BEACON ORANGE TIMEOUT ST (SAFETY) ×2 IMPLANT
EXTRACTOR STONE NITINOL NGAGE (UROLOGICAL SUPPLIES) ×1 IMPLANT
GLOVE SURG POLYISO LF SZ8 (GLOVE) ×2 IMPLANT
GOWN STRL REUS W/TWL XL LVL3 (GOWN DISPOSABLE) ×2 IMPLANT
GUIDEWIRE STR DUAL SENSOR (WIRE) ×2 IMPLANT
IV NS IRRIG 3000ML ARTHROMATIC (IV SOLUTION) ×2 IMPLANT
KIT TURNOVER KIT A (KITS) IMPLANT
LASER FIB FLEXIVA PULSE ID 365 (Laser) IMPLANT
LASER FIB FLEXIVA PULSE ID 550 (Laser) IMPLANT
LASER FIB FLEXIVA PULSE ID 910 (Laser) IMPLANT
MANIFOLD NEPTUNE II (INSTRUMENTS) ×2 IMPLANT
PACK CYSTO (CUSTOM PROCEDURE TRAY) ×2 IMPLANT
SHEATH NAVIGATOR HD 11/13X36 (SHEATH) ×1 IMPLANT
STENT URET 6FRX24 CONTOUR (STENTS) ×1 IMPLANT
TRACTIP FLEXIVA PULS ID 200XHI (Laser) IMPLANT
TRACTIP FLEXIVA PULSE ID 200 (Laser)
TUBING CONNECTING 10 (TUBING) ×2 IMPLANT
TUBING UROLOGY SET (TUBING) ×2 IMPLANT

## 2022-02-03 NOTE — Anesthesia Postprocedure Evaluation (Signed)
Anesthesia Post Note ? ?Patient: Ashley [name] ? ?Procedure(s) Performed: CYSTOSCOPY RIGHT URETEROSCOPY/ STENT EXCHANGE (Right) ? ?  ? ?Patient location during evaluation: PACU ?Anesthesia Type: General ?Level of consciousness: awake and alert ?Pain management: pain level controlled ?Vital Signs Assessment: post-procedure vital signs reviewed and stable ?Respiratory status: spontaneous breathing, nonlabored ventilation, respiratory function stable and patient connected to nasal cannula oxygen ?Cardiovascular status: blood pressure returned to baseline and stable ?Postop Assessment: no apparent nausea or vomiting ?Anesthetic complications: no ? ? ?No notable events documented. ? ?Last Vitals:  ?Vitals:  ? 02/03/22 1345 02/03/22 1415  ?BP: (!) 140/95 122/88  ?Pulse: 84 88  ?Resp: (!) 21 19  ?Temp:  36.5 ?C  ?SpO2: 100% 99%  ?  ?Last Pain:  ?Vitals:  ? 02/03/22 1330  ?TempSrc:   ?PainSc: 8   ? ? ?  ?  ?  ?  ?  ?  ? ?Howe S ? ? ? ? ?

## 2022-02-03 NOTE — Anesthesia Preprocedure Evaluation (Signed)
Anesthesia Evaluation  ?Patient identified by MRN, date of birth, ID band ?Patient awake ? ? ? ?Reviewed: ?Allergy & Precautions, H&P , NPO status , Patient's Chart, lab work & pertinent test results ? ?History of Anesthesia Complications ?(+) PONV and history of anesthetic complications ? ?Airway ?Mallampati: II ? ? ?Neck ROM: full ? ? ? Dental ?  ?Pulmonary ?former smoker,  ?  ?breath sounds clear to auscultation ? ? ? ? ? ? Cardiovascular ?negative cardio ROS ? ? ?Rhythm:regular Rate:Normal ? ? ?  ?Neuro/Psych ? Headaches, PSYCHIATRIC DISORDERS Anxiety  Neuromuscular disease   ? GI/Hepatic ?GERD  ,  ?Endo/Other  ? ? Renal/GU ?stones  ? ?  ?Musculoskeletal ? ? Abdominal ?  ?Peds ? Hematology ?  ?Anesthesia Other Findings ? ? Reproductive/Obstetrics ?H/o breast CA ? ?  ? ? ? ? ? ? ? ? ? ? ? ? ? ?  ?  ? ? ? ? ? ? ? ? ?Anesthesia Physical ?Anesthesia Plan ? ?ASA: 2 ? ?Anesthesia Plan: General  ? ?Post-op Pain Management:   ? ?Induction: Intravenous ? ?PONV Risk Score and Plan: 4 or greater and Dexamethasone, Midazolam, Treatment may vary due to age or medical condition, Propofol infusion and TIVA ? ?Airway Management Planned: LMA ? ?Additional Equipment:  ? ?Intra-op Plan:  ? ?Post-operative Plan: Extubation in OR ? ?Informed Consent: I have reviewed the patients History and Physical, chart, labs and discussed the procedure including the risks, benefits and alternatives for the proposed anesthesia with the patient or authorized representative who has indicated his/her understanding and acceptance.  ? ? ? ?Dental advisory given ? ?Plan Discussed with: CRNA, Anesthesiologist and Surgeon ? ?Anesthesia Plan Comments:   ? ? ? ? ? ? ?Anesthesia Quick Evaluation ? ?

## 2022-02-03 NOTE — Interval H&P Note (Signed)
History and Physical Interval Note: ? ?02/03/2022 ?11:37 AM ? ?Ashley [name]  has presented today for surgery, with the diagnosis of RIGHT URETERAL STONE.  The various methods of treatment have been discussed with the patient and family. After consideration of risks, benefits and other options for treatment, the patient has consented to  Procedure(s): ?CYSTOSCOPY RIGHT URETEROSCOPY/HOLMIUM LASER/STENT EXCHANGE (Right) as a surgical intervention.  The patient's history has been reviewed, patient examined, no change in status, stable for surgery.  I have reviewed the patient's chart and labs.  Questions were answered to the patient's satisfaction.   ? ? ?[name] ? ? ?

## 2022-02-03 NOTE — Anesthesia Procedure Notes (Signed)
Procedure Name: LMA Insertion ?Date/Time: 02/03/2022 12:21 PM ?Performed by: Lind Covert, CRNA ?Pre-anesthesia Checklist: Patient identified, Emergency Drugs available, Suction available and Patient being monitored ?Patient Re-evaluated:Patient Re-evaluated prior to induction ?Oxygen Delivery Method: Circle system utilized ?Preoxygenation: Pre-oxygenation with 100% oxygen ?Induction Type: IV induction ?LMA: LMA inserted ?LMA Size: 4.0 ?Tube type: Oral ?Number of attempts: 1 ?Placement Confirmation: positive ETCO2 and breath sounds checked- equal and bilateral ?Tube secured with: Tape ?Dental Injury: Teeth and Oropharynx as per pre-operative assessment  ? ? ? ? ?

## 2022-02-03 NOTE — Op Note (Signed)
Procedure: 1.  Cystoscopy with removal and replacement of right double-J stent. ?2.  Right ureteroscopic stone extraction. ?3.  Application of fluoroscopy. ? ?Preop diagnosis: 4 mm right proximal ureteral stone. ? ?Postop diagnosis: Same. ? ?Surgeon: Dr. [name]. ? ?Anesthesia: General. ? ?Specimen: 4 mm stone. ? ?Drains: 6 French by 24 cm right contour double-J stent with tether. ? ?EBL: 3 mL. ? ?Complications: None. ? ?Indications: Ashley [name] is a 46 year old female with a 4 mm right proximal ureteral stone and previously undergone stenting treatment of infection and returns now for definitive stone management.  She has had some hematuria with the stent which has been bothersome to her as well.  She does take a large amount of ibuprofen. ? ?Procedure: She was taken operating room where she was given antibiotics.  A general anesthetic was induced.  She was placed in lithotomy position and fitted with PAS hose.  Her perineum and genitalia were prepped with Betadine solution she was draped in usual sterile fashion. ? ?Cystoscopy was performed using 21 [place] scope and 30 degree lens.  The bladder urine was quite bloody without clots and had to irrigate the bladder few times to visualize the stent which was then grasped with a grasping forceps and pulled the urethral meatus.  A sensor wire was then advanced to the kidney and the cystoscope was removed. ? ?A dual-lumen semirigid short ureteroscope was then passed alongside the wire up the ureter but I was unable to identify the stone as it appears to have reflux back into the renal pelvis.  There were some clots with fresh blood in the ureter proximally. ? ?A 11/13 French 36 cm digital access sheath was then passed over the wire to the kidney and the inner core and wire were removed.  A dual-lumen digital scope was then passed and the collecting system was inspected.  I can see the stone fluoroscopically but had considerable difficulty in locating it within the  collecting system due to some fresh blood clots and active oozing however was eventually able to identify the stone and grasped it with an engage basket.  I then removed the stone intact along with the sheath and scope. ? ?The cystoscope was then reinserted and using a 5 [place] open-ended catheter, a sensor wire was inserted to the kidney.  I then advanced the semirigid scope to the renal pelvis to ensure there were no other issues and only found a small amount of clot. ? ?The ureteroscope was removed and the cystoscope was replaced over the wire and a 6 [place] by 24 cm contour double-J stent was advanced the kidney under fluoroscopic guidance.  The wire was removed, leaving a good coil in the kidney and a good coil in the bladder.  The bladder was drained and the cystoscope was removed leaving the stent string exiting the urethra.  The string was not in close to the urethral meatus, trimmed to an appropriate length and tucked vaginally and tampon string fashion.  She was then taken down from lithotomy position, her anesthetic was reversed and she was moved to recovery in stable condition.  There were no complications. ?

## 2022-02-03 NOTE — Transfer of Care (Signed)
Immediate Anesthesia Transfer of Care Note ? ?Patient: Ashley [name] ? ?Procedure(s) Performed: CYSTOSCOPY RIGHT URETEROSCOPY/ STENT EXCHANGE (Right) ? ?Patient Location: PACU ? ?Anesthesia Type:General ? ?Level of Consciousness: sedated ? ?Airway & Oxygen Therapy: Patient Spontanous Breathing and Patient connected to face mask oxygen ? ?Post-op Assessment: Report given to RN and Post -op Vital signs reviewed and stable ? ?Post vital signs: Reviewed and stable ? ?Last Vitals:  ?Vitals Value Taken Time  ?BP 123/87 02/03/22 1322  ?Temp    ?Pulse 86 02/03/22 1323  ?Resp 17 02/03/22 1323  ?SpO2 100 % 02/03/22 1323  ?Vitals shown include unvalidated device data. ? ?Last Pain:  ?Vitals:  ? 02/03/22 0945  ?TempSrc:   ?PainSc: 8   ?   ? ?  ? ?Complications: No notable events documented. ?

## 2022-02-03 NOTE — Discharge Instructions (Signed)
You may remove the stent by pulling the attached string on Monday morning.  If you don't feel you can do that, please call the office to come have it removed. ? ?Please bring the stone to the office for analysis. ? ?If you are having further bleeding, you should not take Ibuprofen.  ? ?I have resent the scripts for oxybutynin and pyridium which can help with the stent irritation.  ?

## 2022-02-04 ENCOUNTER — Encounter (HOSPITAL_COMMUNITY): Payer: Self-pay | Admitting: Urology

## 2022-02-07 ENCOUNTER — Encounter: Payer: Self-pay | Admitting: Plastic Surgery

## 2022-02-07 ENCOUNTER — Ambulatory Visit (INDEPENDENT_AMBULATORY_CARE_PROVIDER_SITE_OTHER): Payer: 59 | Admitting: Plastic Surgery

## 2022-02-07 ENCOUNTER — Other Ambulatory Visit: Payer: Self-pay

## 2022-02-07 ENCOUNTER — Ambulatory Visit: Payer: 59 | Admitting: Plastic Surgery

## 2022-02-07 DIAGNOSIS — C50919 Malignant neoplasm of unspecified site of unspecified female breast: Secondary | ICD-10-CM

## 2022-02-07 DIAGNOSIS — R102 Pelvic and perineal pain: Secondary | ICD-10-CM | POA: Diagnosis not present

## 2022-02-07 DIAGNOSIS — Z9013 Acquired absence of bilateral breasts and nipples: Secondary | ICD-10-CM | POA: Diagnosis not present

## 2022-02-07 DIAGNOSIS — D0511 Intraductal carcinoma in situ of right breast: Secondary | ICD-10-CM | POA: Diagnosis not present
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
## 2022-02-08 DIAGNOSIS — R151 Fecal smearing: Secondary | ICD-10-CM | POA: Diagnosis not present

## 2022-02-08 DIAGNOSIS — K625 Hemorrhage of anus and rectum: Secondary | ICD-10-CM | POA: Diagnosis not present

## 2022-02-08 DIAGNOSIS — K5900 Constipation, unspecified: Secondary | ICD-10-CM | POA: Diagnosis not present

## 2022-02-15 ENCOUNTER — Other Ambulatory Visit: Payer: Self-pay | Admitting: Plastic Surgery

## 2022-02-15 ENCOUNTER — Ambulatory Visit
Admission: RE | Admit: 2022-02-15 | Discharge: 2022-02-15 | Disposition: A | Discharge status: Home / Self Care | Payer: 59 | Source: Ambulatory Visit | Attending: Plastic Surgery | Admitting: Plastic Surgery

## 2022-02-15 DIAGNOSIS — N6489 Other specified disorders of breast: Secondary | ICD-10-CM | POA: Diagnosis not present

## 2022-02-15 DIAGNOSIS — Z9013 Acquired absence of bilateral breasts and nipples: Secondary | ICD-10-CM

## 2022-02-15 DIAGNOSIS — C50919 Malignant neoplasm of unspecified site of unspecified female breast: Secondary | ICD-10-CM

## 2022-02-15 DIAGNOSIS — D0511 Intraductal carcinoma in situ of right breast: Secondary | ICD-10-CM

## 2022-02-20 ENCOUNTER — Other Ambulatory Visit: Payer: Self-pay | Admitting: Family Medicine

## 2022-02-21 ENCOUNTER — Other Ambulatory Visit: Payer: Self-pay

## 2022-02-21 NOTE — Telephone Encounter (Signed)
Patient called to follow up on refill request made by pharmacy for clindamycin (CLEOCIN T) 1 % external solution ? ? ?Also requesting for prior auth to be automatically sent for acid reflux medication. ? ?Pharmacy confirmed as [facility] in [place]. ? ?Please advise at [phone].  ?

## 2022-02-22 NOTE — Telephone Encounter (Signed)
Patient requesting bottle top that can be directly applied; does not want bottle top that requires her to pour out the solution. ? ?Please advise at [phone].  ?

## 2022-02-23 MED ORDER — CLINDAMYCIN PHOSPHATE 1 % EX SOLN
Freq: Two times a day (BID) | CUTANEOUS | 1 refills | Status: AC
Start: 2022-02-23 — End: ?

## 2022-02-23 NOTE — Telephone Encounter (Signed)
Patient is requesting refill on Clindamycin lotion. ? ?Please advise, thanks! ?

## 2022-02-23 NOTE — Addendum Note (Signed)
Addended by: Wadie Lessen on: 02/23/2022 04:35 PM ? ? Modules accepted: Orders ? ?

## 2022-03-08 ENCOUNTER — Other Ambulatory Visit: Payer: Self-pay

## 2022-03-08 DIAGNOSIS — L739 Follicular disorder, unspecified: Secondary | ICD-10-CM

## 2022-03-08 DIAGNOSIS — L281 Prurigo nodularis: Secondary | ICD-10-CM

## 2022-03-08 NOTE — Progress Notes (Signed)
Patient requested referral to Derm for her ongoing skin issues. Referral placed.  ?

## 2022-03-10 DIAGNOSIS — L739 Follicular disorder, unspecified: Secondary | ICD-10-CM | POA: Diagnosis not present

## 2022-03-10 DIAGNOSIS — L988 Other specified disorders of the skin and subcutaneous tissue: Secondary | ICD-10-CM | POA: Diagnosis not present

## 2022-03-10 DIAGNOSIS — Z79899 Other long term (current) drug therapy: Secondary | ICD-10-CM | POA: Diagnosis not present

## 2022-03-10 DIAGNOSIS — B353 Tinea pedis: Secondary | ICD-10-CM | POA: Diagnosis not present

## 2022-03-17 ENCOUNTER — Ambulatory Visit: Payer: 59 | Admitting: Family Medicine

## 2022-04-14 DIAGNOSIS — R1084 Generalized abdominal pain: Secondary | ICD-10-CM | POA: Diagnosis not present

## 2022-04-14 DIAGNOSIS — N201 Calculus of ureter: Secondary | ICD-10-CM | POA: Diagnosis not present

## 2022-04-18 ENCOUNTER — Telehealth: Payer: Self-pay

## 2022-04-18 NOTE — Telephone Encounter (Signed)
Spoke with pt today re her refill on Omeprazole. Pt c/o of not being able to get her refill while she is at the pharmacy.  ? ?I also spoke with the pharmacy staff, per pharmacy staff, pt's insurance will not cover the 90 day supply (only for 30 day supplY) even though the Rx is written for 90 supply. Per the pharmacy staff, pt has already filled her 90day supply, with that being said, per pharmacy base on her insurance, she will need a prior auth.  ? ?Per pt she has never had this issue before. I suggest her to call her insurance. Pt is aware, will call them.  ?

## 2022-04-19 ENCOUNTER — Other Ambulatory Visit: Payer: Self-pay | Admitting: Family Medicine

## 2022-04-19 DIAGNOSIS — K219 Gastro-esophageal reflux disease without esophagitis: Secondary | ICD-10-CM

## 2022-04-19 NOTE — Telephone Encounter (Signed)
Requested Prescriptions  ?Pending Prescriptions Disp Refills  ?? omeprazole (PRILOSEC) 40 MG capsule [Pharmacy Med Name: Omeprazole 40 MG Oral Capsule Delayed Release] 90 capsule 0  ?  Sig: Take 1 capsule by mouth at bedtime  ?  ? Gastroenterology: Proton Pump Inhibitors Passed - 04/19/2022 10:43 AM  ?  ?  Passed - Valid encounter within last 12 months  ?  Recent Outpatient Visits   ?      ? 3 months ago Folliculitis  ? [facility], Cammie Mcgee, MD  ? 6 months ago Vitamin B deficiency  ? [facility], Cammie Mcgee, MD  ? 8 months ago Chronic fatigue  ? [facility], Cammie Mcgee, MD  ? 1 year ago LLQ abdominal pain  ? [facility], Cammie Mcgee, MD  ? 1 year ago Screening-pulmonary TB  ? [facility], Cammie Mcgee, MD  ?  ?  ? ?  ?  ?  ? ? ?

## 2022-04-20 NOTE — Telephone Encounter (Signed)
Per patient she has met the quantity limit with her insurance company. She is asking for PA to be completed.  PA started via [place]: [id number] - Rx #: [id number] Omeprazole 40MG dr capsules

## 2022-04-21 NOTE — Telephone Encounter (Signed)
Approvedtoday  Your PA request has been approved. Additional information will be provided in the approval communication

## 2022-05-08 DIAGNOSIS — R87620 Atypical squamous cells of undetermined significance on cytologic smear of vagina (ASC-US): Secondary | ICD-10-CM | POA: Diagnosis not present

## 2022-05-08 DIAGNOSIS — R69 Illness, unspecified: Secondary | ICD-10-CM | POA: Diagnosis not present

## 2022-06-28 NOTE — Telephone Encounter (Signed)
error 

## 2022-06-29 ENCOUNTER — Ambulatory Visit (INDEPENDENT_AMBULATORY_CARE_PROVIDER_SITE_OTHER): Payer: 59 | Admitting: Family Medicine

## 2022-06-29 VITALS — BP 128/82 | HR 76 | Temp 98.1°F | Ht 64.0 in | Wt 114.0 lb

## 2022-06-29 DIAGNOSIS — T148XXA Other injury of unspecified body region, initial encounter: Secondary | ICD-10-CM | POA: Diagnosis not present

## 2022-06-29 DIAGNOSIS — L739 Follicular disorder, unspecified: Secondary | ICD-10-CM | POA: Diagnosis not present

## 2022-06-29 DIAGNOSIS — B353 Tinea pedis: Secondary | ICD-10-CM

## 2022-06-29 MED ORDER — CLOTRIMAZOLE 1 % EX CREA: 1.0000 | TOPICAL_CREAM | Freq: Three times a day (TID) | CUTANEOUS | 1 refills | Status: AC

## 2022-06-29 MED ORDER — CLINDAMYCIN PHOS-BENZOYL PEROX 1-5 % EX GEL: Freq: Two times a day (BID) | CUTANEOUS | 5 refills | Status: AC
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
## 2022-06-30 LAB — CBC WITH DIFFERENTIAL/PLATELET
Absolute Monocytes: 449 cells/uL (ref 200–950)
Basophils Absolute: 59 cells/uL (ref 0–200)
Basophils Relative: 0.9 %
Eosinophils Absolute: 59 cells/uL (ref 15–500)
Eosinophils Relative: 0.9 %
HCT: 36.5 % (ref 35.0–45.0)
Hemoglobin: 12.2 g/dL (ref 11.7–15.5)
Lymphs Abs: 1632 cells/uL (ref 850–3900)
MCH: 31.7 pg (ref 27.0–33.0)
MCHC: 33.4 g/dL (ref 32.0–36.0)
MCV: 94.8 fL (ref 80.0–100.0)
MPV: 12.7 fL — ABNORMAL HIGH (ref 7.5–12.5)
Monocytes Relative: 6.9 %
Neutro Abs: 4303 cells/uL (ref 1500–7800)
Neutrophils Relative %: 66.2 %
Platelets: 153 10*3/uL (ref 140–400)
RBC: 3.85 10*6/uL (ref 3.80–5.10)
RDW: 12.3 % (ref 11.0–15.0)
Total Lymphocyte: 25.1 %
WBC: 6.5 10*3/uL (ref 3.8–10.8)

## 2022-06-30 LAB — COMPLETE METABOLIC PANEL WITH GFR
AG Ratio: 2.1 (calc) (ref 1.0–2.5)
ALT: 18 U/L (ref 6–29)
AST: 15 U/L (ref 10–35)
Albumin: 4.5 g/dL (ref 3.6–5.1)
Alkaline phosphatase (APISO): 43 U/L (ref 31–125)
BUN: 18 mg/dL (ref 7–25)
CO2: 28 mmol/L (ref 20–32)
Calcium: 9.3 mg/dL (ref 8.6–10.2)
Chloride: 107 mmol/L (ref 98–110)
Creat: 0.72 mg/dL (ref 0.50–0.99)
Globulin: 2.1 g/dL (calc) (ref 1.9–3.7)
Glucose, Bld: 106 mg/dL — ABNORMAL HIGH (ref 65–99)
Potassium: 4 mmol/L (ref 3.5–5.3)
Sodium: 142 mmol/L (ref 135–146)
Total Bilirubin: 0.4 mg/dL (ref 0.2–1.2)
Total Protein: 6.6 g/dL (ref 6.1–8.1)
eGFR: 104 mL/min/{1.73_m2} (ref >=60)

## 2022-07-11 DIAGNOSIS — B3731 Acute candidiasis of vulva and vagina: Secondary | ICD-10-CM | POA: Diagnosis not present

## 2022-07-23 ENCOUNTER — Other Ambulatory Visit: Payer: Self-pay | Admitting: Family Medicine

## 2022-07-23 DIAGNOSIS — K219 Gastro-esophageal reflux disease without esophagitis: Secondary | ICD-10-CM

## 2022-07-24 NOTE — Telephone Encounter (Signed)
Pharmacy faxed a refill request for omeprazole (PRILOSEC) 40 MG capsule [[id number]]    Order Details Dose, Route, Frequency: As Directed  Dispense Quantity: 90 capsule Refills: 0        Sig: Take 1 capsule by mouth at bedtime       Start Date: 04/19/22 End Date: --  Written Date: 04/19/22 Expiration Date: 04/19/23

## 2022-08-01 DIAGNOSIS — R350 Frequency of micturition: Secondary | ICD-10-CM | POA: Diagnosis not present

## 2022-08-01 DIAGNOSIS — R300 Dysuria: Secondary | ICD-10-CM | POA: Diagnosis not present

## 2022-08-01 DIAGNOSIS — R399 Unspecified symptoms and signs involving the genitourinary system: Secondary | ICD-10-CM | POA: Diagnosis not present

## 2022-08-01 DIAGNOSIS — B3731 Acute candidiasis of vulva and vagina: Secondary | ICD-10-CM | POA: Diagnosis not present

## 2022-08-11 ENCOUNTER — Ambulatory Visit: Payer: 59 | Admitting: Family Medicine

## 2022-08-17 ENCOUNTER — Ambulatory Visit (INDEPENDENT_AMBULATORY_CARE_PROVIDER_SITE_OTHER): Payer: 59 | Admitting: Family Medicine

## 2022-08-17 VITALS — BP 120/60 | HR 76 | Temp 97.5°F | Ht 64.0 in | Wt 111.5 lb

## 2022-08-17 DIAGNOSIS — N898 Other specified noninflammatory disorders of vagina: Secondary | ICD-10-CM | POA: Diagnosis not present

## 2022-08-17 DIAGNOSIS — Z202 Contact with and (suspected) exposure to infections with a predominantly sexual mode of transmission: Secondary | ICD-10-CM | POA: Diagnosis not present

## 2022-08-17 DIAGNOSIS — R69 Illness, unspecified: Secondary | ICD-10-CM | POA: Diagnosis not present

## 2022-08-17 LAB — URINALYSIS, ROUTINE W REFLEX MICROSCOPIC
Bilirubin Urine: NEGATIVE
Casts: NONE SEEN /LPF
Crystals: NONE SEEN /HPF
Glucose, UA: NEGATIVE
Hyaline Cast: NONE SEEN /LPF
Leukocytes,Ua: NEGATIVE
Nitrite: NEGATIVE
Protein, ur: NEGATIVE
Specific Gravity, Urine: 1.02 (ref 1.001–1.035)
Yeast: NONE SEEN /HPF
pH: 7 (ref 5.0–8.0)

## 2022-08-17 LAB — WET PREP FOR TRICH, YEAST, CLUE

## 2022-08-17 LAB — MICROSCOPIC MESSAGE

## 2022-08-17 MED ORDER — FLUCONAZOLE 50 MG PO TABS: 50.0000 mg | ORAL_TABLET | Freq: Every day | ORAL | 0 refills | Status: DC
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
## 2022-08-18 ENCOUNTER — Other Ambulatory Visit: Payer: Self-pay

## 2022-08-18 DIAGNOSIS — Z202 Contact with and (suspected) exposure to infections with a predominantly sexual mode of transmission: Secondary | ICD-10-CM | POA: Diagnosis not present

## 2022-08-18 DIAGNOSIS — N898 Other specified noninflammatory disorders of vagina: Secondary | ICD-10-CM | POA: Diagnosis not present

## 2022-08-18 DIAGNOSIS — R1084 Generalized abdominal pain: Secondary | ICD-10-CM | POA: Diagnosis not present

## 2022-08-18 LAB — RPR: RPR Ser Ql: NONREACTIVE

## 2022-08-18 LAB — HIV ANTIBODY (ROUTINE TESTING W REFLEX): HIV 1&2 Ab, 4th Generation: NONREACTIVE

## 2022-08-18 NOTE — Addendum Note (Signed)
Addended by: Randal Buba K on: 08/18/2022 10:27 AM   Modules accepted: Orders

## 2022-08-19 LAB — C. TRACHOMATIS/N. GONORRHOEAE RNA
C. trachomatis RNA, TMA: NOT DETECTED
N. gonorrhoeae RNA, TMA: NOT DETECTED

## 2022-08-21 ENCOUNTER — Other Ambulatory Visit: Payer: Self-pay | Admitting: Family Medicine

## 2022-08-21 MED ORDER — SULFAMETHOXAZOLE-TRIMETHOPRIM 400-80 MG PO TABS: 1.0000 | ORAL_TABLET | Freq: Two times a day (BID) | ORAL | 0 refills | Status: DC

## 2022-11-19 DIAGNOSIS — Z882 Allergy status to sulfonamides status: Secondary | ICD-10-CM | POA: Diagnosis not present

## 2022-11-19 DIAGNOSIS — R112 Nausea with vomiting, unspecified: Secondary | ICD-10-CM | POA: Diagnosis not present

## 2022-11-19 DIAGNOSIS — Z87891 Personal history of nicotine dependence: Secondary | ICD-10-CM | POA: Diagnosis not present

## 2022-11-19 DIAGNOSIS — Z880 Allergy status to penicillin: Secondary | ICD-10-CM | POA: Diagnosis not present

## 2022-11-19 DIAGNOSIS — G43809 Other migraine, not intractable, without status migrainosus: Secondary | ICD-10-CM | POA: Diagnosis not present

## 2022-11-19 DIAGNOSIS — R519 Headache, unspecified: Secondary | ICD-10-CM | POA: Diagnosis not present

## 2022-11-19 DIAGNOSIS — J0110 Acute frontal sinusitis, unspecified: Secondary | ICD-10-CM | POA: Diagnosis not present

## 2022-12-07 ENCOUNTER — Other Ambulatory Visit: Payer: Self-pay | Admitting: Family Medicine

## 2022-12-07 ENCOUNTER — Telehealth: Payer: Self-pay

## 2022-12-07 MED ORDER — RIZATRIPTAN BENZOATE 5 MG PO TABS: 5.0000 mg | ORAL_TABLET | ORAL | 0 refills | Status: DC | PRN

## 2022-12-07 NOTE — Telephone Encounter (Signed)
Pt called in stating that her neurologist office will not be able to fill this med rizatriptan (MAXALT) 5 MG tablet. Pt would like to know if pcp would be able to refill this med from now on. Pt would like a cb about this please asap.  Cb#: [phone] (Pt states that she does not have vm set up and it is ok to call work phone listed in pt's chart)

## 2022-12-11 ENCOUNTER — Telehealth: Payer: Self-pay

## 2022-12-11 ENCOUNTER — Other Ambulatory Visit: Payer: Self-pay | Admitting: Family Medicine

## 2022-12-11 NOTE — Telephone Encounter (Signed)
Fax received from [facility] that there is a drug interaction between pt's Rizatriptan and Propranolol. Fax sent to [facility] to advise to stop Propranolol. Mjp,lpn

## 2022-12-12 NOTE — Telephone Encounter (Signed)
Pharmacy comment: Drug drug interaction between rizatriptan and propranolol>---contraindicated. please advise.

## 2022-12-13 ENCOUNTER — Telehealth: Payer: Self-pay

## 2022-12-13 NOTE — Telephone Encounter (Signed)
Pt called stating that you told pt to discontinue the Propranolol and take the maxalt instead.   Pt stated that she can't stop taking the proranolol because it helps her w/migraines plus she has been on for many years.   Pls advice?

## 2022-12-14 DIAGNOSIS — R040 Epistaxis: Secondary | ICD-10-CM | POA: Diagnosis not present

## 2022-12-14 DIAGNOSIS — J329 Chronic sinusitis, unspecified: Secondary | ICD-10-CM | POA: Diagnosis not present

## 2022-12-14 NOTE — Telephone Encounter (Signed)
Tried to call pt, LVM re meds.

## 2022-12-19 ENCOUNTER — Ambulatory Visit: Payer: 59 | Admitting: Family Medicine

## 2022-12-25 DIAGNOSIS — Z1272 Encounter for screening for malignant neoplasm of vagina: Secondary | ICD-10-CM | POA: Diagnosis not present

## 2022-12-25 DIAGNOSIS — Z01419 Encounter for gynecological examination (general) (routine) without abnormal findings: Secondary | ICD-10-CM | POA: Diagnosis not present

## 2022-12-25 DIAGNOSIS — Z113 Encounter for screening for infections with a predominantly sexual mode of transmission: Secondary | ICD-10-CM | POA: Diagnosis not present

## 2022-12-25 DIAGNOSIS — Z681 Body mass index (BMI) 19 or less, adult: Secondary | ICD-10-CM | POA: Diagnosis not present

## 2022-12-25 DIAGNOSIS — R69 Illness, unspecified: Secondary | ICD-10-CM | POA: Diagnosis not present

## 2023-01-03 ENCOUNTER — Other Ambulatory Visit: Payer: Self-pay | Admitting: Family Medicine

## 2023-01-03 NOTE — Telephone Encounter (Signed)
Med request was Discontinued by: Pricilla Handler, CMA on 01/12/2022 11:50   Pls advice?

## 2023-01-03 NOTE — Telephone Encounter (Signed)
Requested medication (s) are due for refill today: Amount not specified  Requested medication (s) are on the active medication list: yes    Last refill: 01/12/22  Amount not specified  Future visit scheduled no  Notes to clinic:Historical provider, please review. Thank you.  Requested Prescriptions  Pending Prescriptions Disp Refills   propranolol (INDERAL) 20 MG tablet      Sig: Take 1 tablet (20 mg total) by mouth daily.     Cardiovascular:  Beta Blockers Failed - 01/03/2023 10:46 AM      Failed - Valid encounter within last 6 months    Recent Outpatient Visits           11 months ago [place] Pickard, Cammie Mcgee, MD   1 year ago Vitamin B deficiency   [place] Pickard, Cammie Mcgee, MD   1 year ago Chronic fatigue   [place] [facility] Scha[facility], Cammie Mcgee, MD   1 year ago LLQ abdominal pain   [place] [facility] Schaumann, Cammie Mcgee, MD   1 year ago Screening-pulmonary TB   [place] Pickard, Cammie Mcgee, MD              Passed - Last BP in normal range    BP Readings from Last 1 Encounters:  08/17/22 120/60         Passed - Last Heart Rate in normal range    Pulse Readings from Last 1 Encounters:  08/17/22 76

## 2023-01-03 NOTE — Telephone Encounter (Signed)
Prescription Request  01/03/2023  Is this a "Controlled Substance" medicine? No  LOV: 08/17/2022  What is the name of the medication or equipment? propranolol (INDERAL) 20 MG tablet   Have you contacted your pharmacy to request a refill? No   Which pharmacy would you like this sent to? [facility] on [facility]   Patient notified that their request is being sent to the clinical staff for review and that they should receive a response within 2 business days.   Please advise at [facility] [phone]

## 2023-01-04 MED ORDER — PROPRANOLOL HCL 20 MG PO TABS: 20.0000 mg | ORAL_TABLET | Freq: Every day | ORAL | 3 refills | Status: DC

## 2023-01-25 DIAGNOSIS — Z13220 Encounter for screening for lipoid disorders: Secondary | ICD-10-CM | POA: Diagnosis not present

## 2023-01-25 DIAGNOSIS — Z13228 Encounter for screening for other metabolic disorders: Secondary | ICD-10-CM | POA: Diagnosis not present

## 2023-01-25 DIAGNOSIS — Z113 Encounter for screening for infections with a predominantly sexual mode of transmission: Secondary | ICD-10-CM | POA: Diagnosis not present

## 2023-01-25 DIAGNOSIS — R69 Illness, unspecified: Secondary | ICD-10-CM | POA: Diagnosis not present

## 2023-01-25 DIAGNOSIS — Z1329 Encounter for screening for other suspected endocrine disorder: Secondary | ICD-10-CM | POA: Diagnosis not present

## 2023-01-25 DIAGNOSIS — N760 Acute vaginitis: Secondary | ICD-10-CM | POA: Diagnosis not present

## 2023-01-25 DIAGNOSIS — Z1321 Encounter for screening for nutritional disorder: Secondary | ICD-10-CM | POA: Diagnosis not present

## 2023-01-25 DIAGNOSIS — Z131 Encounter for screening for diabetes mellitus: Secondary | ICD-10-CM | POA: Diagnosis not present

## 2023-01-29 ENCOUNTER — Ambulatory Visit: Payer: 59 | Admitting: Family Medicine

## 2023-02-01 ENCOUNTER — Ambulatory Visit (INDEPENDENT_AMBULATORY_CARE_PROVIDER_SITE_OTHER): Payer: 59 | Admitting: Family Medicine

## 2023-02-01 ENCOUNTER — Encounter: Payer: Self-pay | Admitting: Family Medicine

## 2023-02-01 VITALS — BP 110/70 | HR 66 | Temp 97.7°F | Ht 64.0 in | Wt 109.0 lb

## 2023-02-01 DIAGNOSIS — L989 Disorder of the skin and subcutaneous tissue, unspecified: Secondary | ICD-10-CM

## 2023-02-01 MED ORDER — ADAPALENE 0.1 % EX GEL: Freq: Every day | CUTANEOUS | 0 refills | Status: AC
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
## 2023-02-22 ENCOUNTER — Encounter: Payer: Self-pay | Admitting: Family Medicine

## 2023-02-22 ENCOUNTER — Ambulatory Visit (INDEPENDENT_AMBULATORY_CARE_PROVIDER_SITE_OTHER): Payer: 59 | Admitting: Family Medicine

## 2023-02-22 VITALS — BP 116/62 | HR 71 | Ht 64.0 in | Wt 105.8 lb

## 2023-02-22 DIAGNOSIS — R21 Rash and other nonspecific skin eruption: Secondary | ICD-10-CM | POA: Diagnosis not present

## 2023-02-22 DIAGNOSIS — R300 Dysuria: Secondary | ICD-10-CM

## 2023-02-22 MED ORDER — FLUCONAZOLE 150 MG PO TABS: 150.0000 mg | ORAL_TABLET | Freq: Once | ORAL | 0 refills | Status: AC

## 2023-02-22 MED ORDER — VALACYCLOVIR HCL 1 G PO TABS: 1000.0000 mg | ORAL_TABLET | Freq: Two times a day (BID) | ORAL | 0 refills | Status: AC

## 2023-02-22 MED ORDER — CEPHALEXIN 500 MG PO CAPS: 500.0000 mg | ORAL_CAPSULE | Freq: Three times a day (TID) | ORAL | 0 refills | Status: DC
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
## 2023-02-23 LAB — HSV(HERPES SIMPLEX VRS) I + II AB-IGG
HAV 1 IGG,TYPE SPECIFIC AB: 49.5 index — ABNORMAL HIGH
HSV 2 IGG,TYPE SPECIFIC AB: <0.9 index

## 2023-02-23 IMAGING — DX DG ABDOMEN 2V
2 series · 2 of 2 positions shown · non-contrast
Comparison: 01/15/2017 stone study. 01/03/2018 plain films from
[HOSPITAL].

CLINICAL DATA: Left lower quadrant pain.

EXAM:
ABDOMEN - 2 VIEW

[abdomen erect]
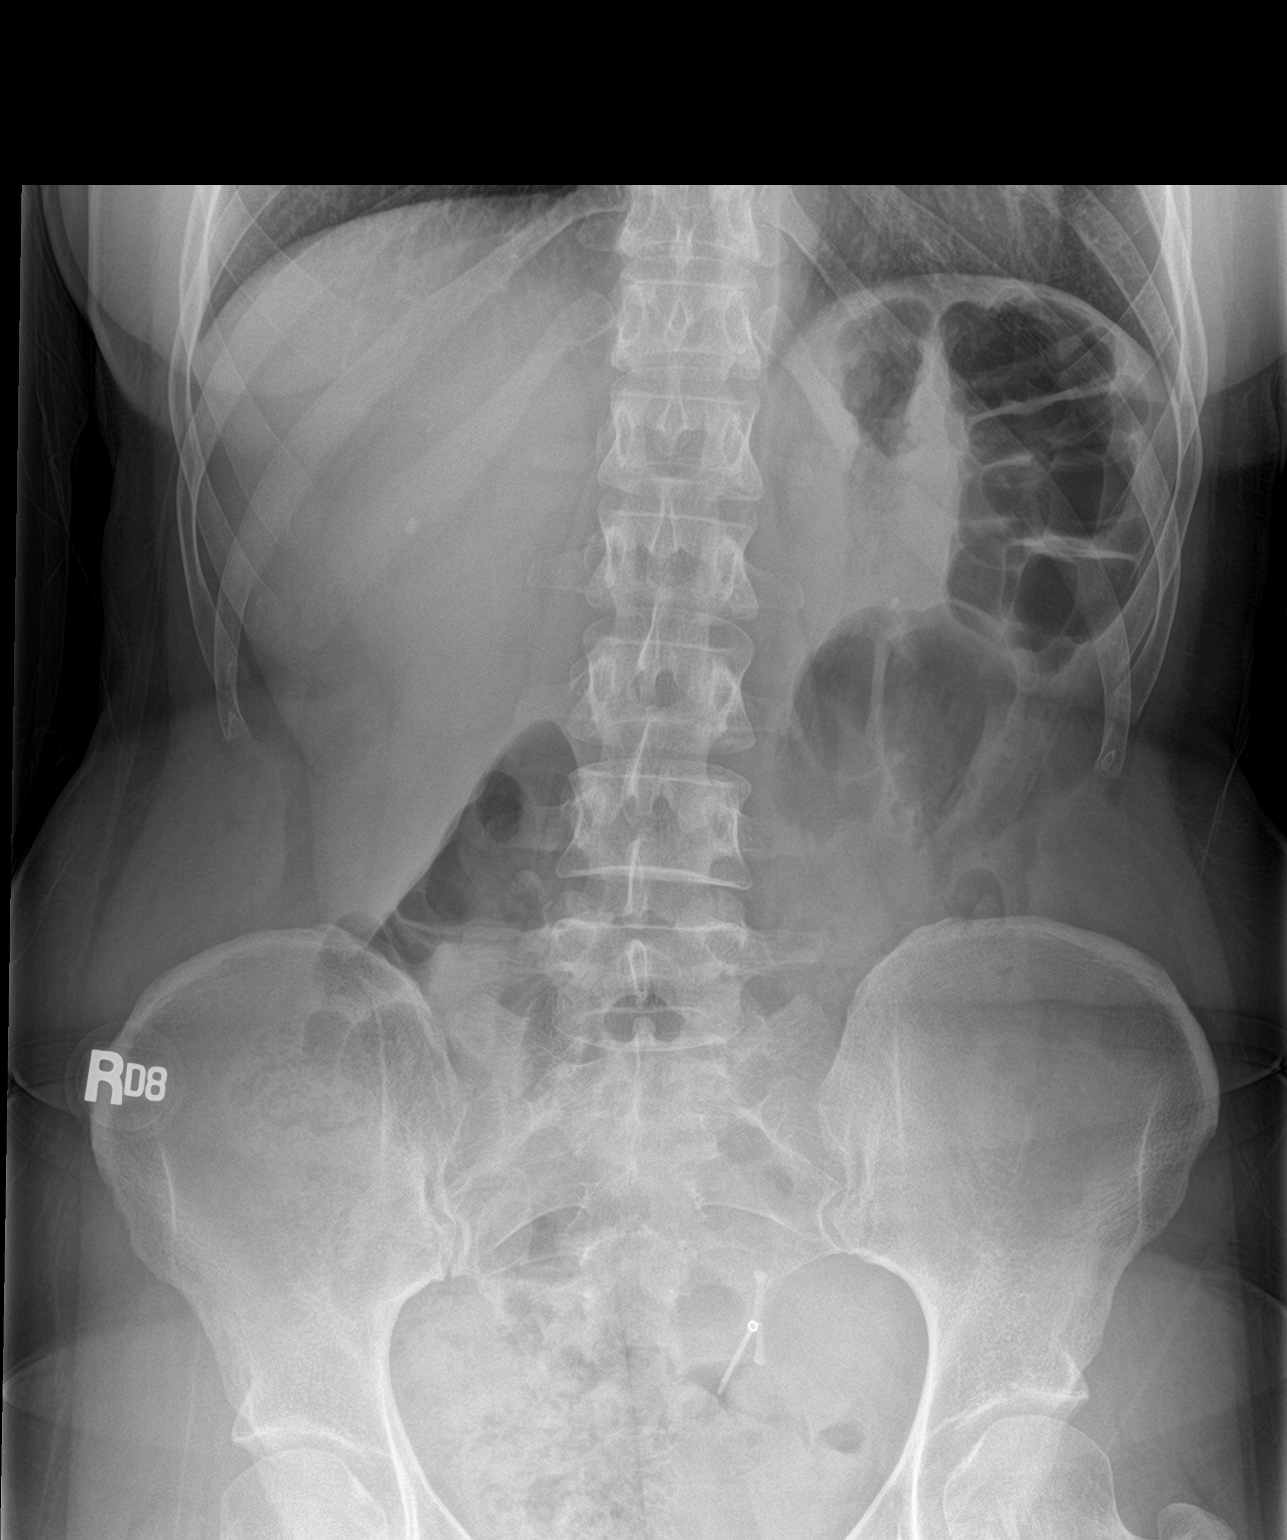

[abdomen supine]
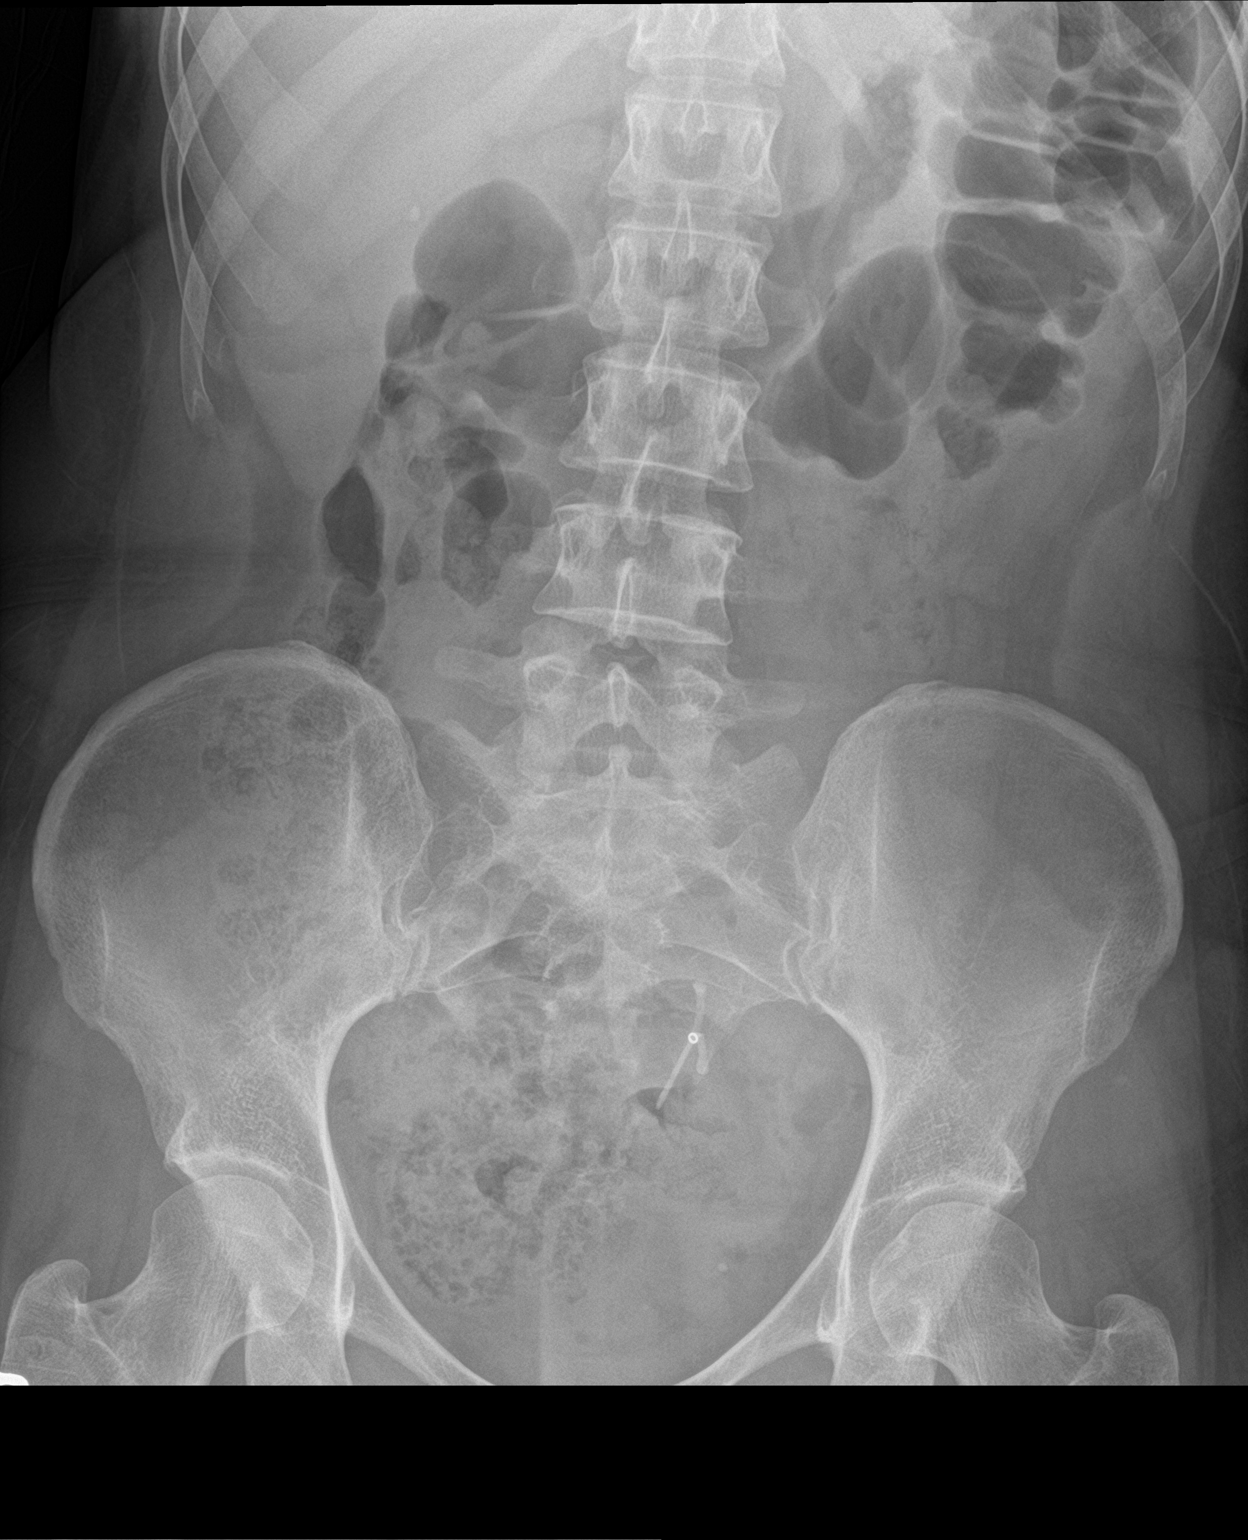

[2 of 2 positions shown; findings below may reference images not displayed]

FINDINGS: Upright and supine views. The upright view demonstrate no free
intraperitoneal air. No significant air fluid levels.

The supine image demonstrates a moderate amount of descending and
sigmoid colonic stool. Intrauterine device.

Calcification projecting over the right-side of the abdomen of 4 mm
is likely in the interpolar right kidney. A left pelvic
calcification was similar on 01/03/2018, presumably a phlebolith.
IMPRESSION: Possible constipation.

Right nephrolithiasis

## 2023-03-05 ENCOUNTER — Telehealth: Payer: Self-pay | Admitting: Family Medicine

## 2023-03-05 ENCOUNTER — Ambulatory Visit: Payer: Self-pay

## 2023-03-05 ENCOUNTER — Other Ambulatory Visit: Payer: Self-pay | Admitting: Family Medicine

## 2023-03-05 MED ORDER — METRONIDAZOLE 500 MG PO TABS: 500.0000 mg | ORAL_TABLET | Freq: Two times a day (BID) | ORAL | 0 refills | Status: AC

## 2023-03-05 NOTE — Telephone Encounter (Signed)
Patient called in requesting a refill on diflucan and would like a 3-5 day supply she states that this is a really bad one and the one day didn't work. She uses [facility] in [place].  CB# [phone]

## 2023-03-05 NOTE — Telephone Encounter (Signed)
See Nurse Triage encounter

## 2023-03-05 NOTE — Telephone Encounter (Signed)
Chief Complaint: Vaginal yeast infection symptoms Symptoms: itching (5-8/10), burning (6-7), swelling Frequency: onset 1 week after finishing antibiotics for UTI Pertinent Negatives: Patient denies discharge, other symptoms Disposition: [] ED /[] Urgent Care (no appt availability in office) / [] Appointment(In office/virtual)/ []  [facility]/ [] Home Care/ [] Refused Recommended Disposition /[] [facility]/ [x]  Follow-up with PCP Additional Notes: Patient says she's tried OTC medications and it's not helping. She says she had 1 fluconazole left from previous and took that, but no relief, requesting refills. Advised I will send this to Dr. Dennard Schaumann for recommendation.       Patient called in requesting a refill on diflucan and would like a 3-5 day supply she states that this is a really bad one and the one day didn't work. She uses [facility] in [place].   CB# [phone]      Reason for Disposition  [1] Symptoms of a yeast infection (i.e., itchy, white discharge, not bad smelling) AND [2] not improved > 3 days following Care Advice  Answer Assessment - Initial Assessment Questions 1. SYMPTOM: "What's the main symptom you're concerned about?" (e.g., pain, itching, dryness)     Vaginal itching, burning, swelling 2. LOCATION: "Where is the itching located?" (e.g., inside/outside, left/right)     Both outside/inside 3. ONSET: "When did the symptoms start?"     1 week ago after finishing the antibiotics 4. PAIN: "Is there any pain?" If Yes, ask: "How bad is it?" (Scale: 1-10; mild, moderate, severe)   -  MILD (1-3): Doesn't interfere with normal activities.    -  MODERATE (4-7): Interferes with normal activities (e.g., work or school) or awakens from sleep.     -  SEVERE (8-10): Excruciating pain, unable to do any normal activities.     6-7 5. ITCHING: "Is there any itching?" If Yes, ask: "How bad is it?" (Scale: 1-10; mild, moderate, severe)     5-8 6. CAUSE: "What do  you think is causing the discharge?" "Have you had the same problem before? What happened then?"     No discharge, same problem as before 7. OTHER SYMPTOMS: "Do you have any other symptoms?" (e.g., fever, itching, vaginal bleeding, pain with urination, injury to genital area, vaginal foreign body)     No  Protocols used: Vaginal Symptoms-A-AH

## 2023-03-21 ENCOUNTER — Ambulatory Visit
Admission: EM | Admit: 2023-03-21 | Discharge: 2023-03-21 | Disposition: A | Discharge status: Home / Self Care | Payer: 59 | Attending: Nurse Practitioner | Admitting: Nurse Practitioner

## 2023-03-21 DIAGNOSIS — J329 Chronic sinusitis, unspecified: Secondary | ICD-10-CM

## 2023-03-21 DIAGNOSIS — B9689 Other specified bacterial agents as the cause of diseases classified elsewhere: Secondary | ICD-10-CM

## 2023-03-21 MED ORDER — FLUCONAZOLE 150 MG PO TABS: 150.0000 mg | ORAL_TABLET | Freq: Once | ORAL | 1 refills | Status: AC

## 2023-03-21 MED ORDER — DOXYCYCLINE HYCLATE 100 MG PO CAPS: 100.0000 mg | ORAL_CAPSULE | Freq: Two times a day (BID) | ORAL | 0 refills | Status: DC

## 2023-03-21 NOTE — ED Triage Notes (Addendum)
Pt c/o congestion, blood in mucus, ear fullness x 2 weeks.   Pt states azithromycin doesn't help.  Pt has used ibuprofen tylenol and sailine nasal spary to treat sx's, they have alleviated some of the sx

## 2023-03-21 NOTE — Discharge Instructions (Signed)
Take the doxycycline as prescribed to treat the sinus infection.  Some things that can make you feel better are: - Increased rest - Increasing fluid with water/sugar free electrolytes - Acetaminophen and ibuprofen as needed for fever/pain - Salt water gargling, chloraseptic spray and throat lozenges - OTC guaifenesin (Mucinex) 600 mg twice daily for congestion - Saline sinus flushes or a neti pot - Humidifying the air
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
## 2023-03-23 ENCOUNTER — Telehealth: Payer: Self-pay | Admitting: Emergency Medicine

## 2023-03-23 MED ORDER — CEFDINIR 300 MG PO CAPS: 300.0000 mg | ORAL_CAPSULE | Freq: Two times a day (BID) | ORAL | 0 refills | Status: AC

## 2023-03-23 NOTE — Telephone Encounter (Signed)
Patient called and states doxycycline is making her sick.  Cefdinir  was called in for patient.  Patient instructed to stop taking the cefdinir if hives or allergic reaction occurs due to her penicillin allergy.

## 2023-04-02 DIAGNOSIS — E559 Vitamin D deficiency, unspecified: Secondary | ICD-10-CM | POA: Diagnosis not present

## 2023-05-30 ENCOUNTER — Encounter: Payer: Self-pay | Admitting: Emergency Medicine

## 2023-05-30 ENCOUNTER — Ambulatory Visit
Admission: EM | Admit: 2023-05-30 | Discharge: 2023-05-30 | Disposition: A | Discharge status: Home / Self Care | Payer: 59 | Attending: Nurse Practitioner | Admitting: Nurse Practitioner

## 2023-05-30 ENCOUNTER — Other Ambulatory Visit: Payer: Self-pay

## 2023-05-30 DIAGNOSIS — Z886 Allergy status to analgesic agent status: Secondary | ICD-10-CM | POA: Diagnosis not present

## 2023-05-30 DIAGNOSIS — X58XXXA Exposure to other specified factors, initial encounter: Secondary | ICD-10-CM | POA: Diagnosis not present

## 2023-05-30 DIAGNOSIS — K219 Gastro-esophageal reflux disease without esophagitis: Secondary | ICD-10-CM | POA: Diagnosis not present

## 2023-05-30 DIAGNOSIS — E876 Hypokalemia: Secondary | ICD-10-CM | POA: Diagnosis not present

## 2023-05-30 DIAGNOSIS — Z91040 Latex allergy status: Secondary | ICD-10-CM | POA: Diagnosis not present

## 2023-05-30 DIAGNOSIS — R110 Nausea: Secondary | ICD-10-CM | POA: Diagnosis not present

## 2023-05-30 DIAGNOSIS — S39011A Strain of muscle, fascia and tendon of abdomen, initial encounter: Secondary | ICD-10-CM | POA: Diagnosis not present

## 2023-05-30 DIAGNOSIS — R0981 Nasal congestion: Secondary | ICD-10-CM | POA: Diagnosis not present

## 2023-05-30 DIAGNOSIS — R1013 Epigastric pain: Secondary | ICD-10-CM | POA: Diagnosis not present

## 2023-05-30 DIAGNOSIS — X500XXA Overexertion from strenuous movement or load, initial encounter: Secondary | ICD-10-CM | POA: Diagnosis not present

## 2023-05-30 DIAGNOSIS — F1290 Cannabis use, unspecified, uncomplicated: Secondary | ICD-10-CM | POA: Diagnosis not present

## 2023-05-30 DIAGNOSIS — R1030 Lower abdominal pain, unspecified: Secondary | ICD-10-CM | POA: Diagnosis not present

## 2023-05-30 LAB — POCT URINALYSIS DIP (MANUAL ENTRY)
Bilirubin, UA: NEGATIVE
Glucose, UA: NEGATIVE mg/dL
Ketones, POC UA: NEGATIVE mg/dL
Leukocytes, UA: NEGATIVE
Nitrite, UA: NEGATIVE
Protein Ur, POC: NEGATIVE mg/dL
Spec Grav, UA: >=1.03 — AB (ref 1.010–1.025)
Urobilinogen, UA: 0.2 E.U./dL
pH, UA: 5.5 (ref 5.0–8.0)

## 2023-05-30 MED ORDER — OMEPRAZOLE 40 MG PO CPDR
40.0000 mg | DELAYED_RELEASE_CAPSULE | Freq: Every day | ORAL | 0 refills | Status: DC
Start: 2023-05-30 — End: 2023-08-02

## 2023-05-30 MED ORDER — PROMETHAZINE HCL 25 MG PO TABS: 25.0000 mg | ORAL_TABLET | Freq: Four times a day (QID) | ORAL | 0 refills | Status: DC | PRN

## 2023-05-30 MED ORDER — LIDOCAINE VISCOUS HCL 2 % MT SOLN: 15.0000 mL | Freq: Once | OROMUCOSAL | Status: DC

## 2023-05-30 MED ORDER — ALUM & MAG HYDROXIDE-SIMETH 200-200-20 MG/5ML PO SUSP: 30.0000 mL | Freq: Once | ORAL | Status: DC

## 2023-05-30 NOTE — ED Triage Notes (Signed)
Pt reports abdominal pain, nausea, decreased appetite for last week. Denies any known fevers.
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
## 2023-05-30 NOTE — Discharge Instructions (Signed)
I have sent refills of your Phenergan and omeprazole to the [facility].  Please resume these medicines.  Stop taking ibuprofen.  Follow-up with your primary care provider regarding the headache and chronic sinus infections.

## 2023-05-30 NOTE — ED Notes (Signed)
Per provider, pt declined GI cocktail at end of assessment.

## 2023-06-02 ENCOUNTER — Telehealth: Payer: Self-pay | Admitting: Emergency Medicine

## 2023-06-02 NOTE — Telephone Encounter (Signed)
Pt called and inquired about "cloudy" finding in urinalysis.Discussed with pt pending urine culture. Pt verbalized understanding.

## 2023-06-04 ENCOUNTER — Telehealth: Payer: Self-pay | Admitting: Family Medicine

## 2023-06-04 NOTE — Telephone Encounter (Signed)
Patient called to follow up on recent labs; stated her potassium level was low. Requesting call back with recommendations of OTC meds/supplements she can take to normalize it.   Please advise at [phone] .

## 2023-06-06 DIAGNOSIS — N760 Acute vaginitis: Secondary | ICD-10-CM | POA: Diagnosis not present

## 2023-06-06 DIAGNOSIS — E876 Hypokalemia: Secondary | ICD-10-CM | POA: Diagnosis not present

## 2023-06-06 DIAGNOSIS — Z113 Encounter for screening for infections with a predominantly sexual mode of transmission: Secondary | ICD-10-CM | POA: Diagnosis not present

## 2023-06-06 DIAGNOSIS — R319 Hematuria, unspecified: Secondary | ICD-10-CM | POA: Diagnosis not present

## 2023-06-08 ENCOUNTER — Ambulatory Visit: Payer: 59 | Admitting: Family Medicine

## 2023-06-08 NOTE — Telephone Encounter (Signed)
Called spoke w/pt re pt's lab results. Advise pt I don't have any recent lab recents.  Per pt was actually requesting to see if she can have her lab retested but per pt already had it taken care w/OBGYN.  Nothing further.

## 2023-06-30 DIAGNOSIS — H00015 Hordeolum externum left lower eyelid: Secondary | ICD-10-CM | POA: Diagnosis not present

## 2023-06-30 DIAGNOSIS — L03211 Cellulitis of face: Secondary | ICD-10-CM | POA: Diagnosis not present

## 2023-07-04 DIAGNOSIS — K625 Hemorrhage of anus and rectum: Secondary | ICD-10-CM | POA: Diagnosis not present

## 2023-07-04 DIAGNOSIS — K641 Second degree hemorrhoids: Secondary | ICD-10-CM | POA: Diagnosis not present

## 2023-07-05 DIAGNOSIS — N200 Calculus of kidney: Secondary | ICD-10-CM | POA: Diagnosis not present

## 2023-07-18 DIAGNOSIS — K641 Second degree hemorrhoids: Secondary | ICD-10-CM | POA: Diagnosis not present

## 2023-07-24 ENCOUNTER — Other Ambulatory Visit: Payer: Self-pay

## 2023-07-24 ENCOUNTER — Encounter: Payer: Self-pay | Admitting: Emergency Medicine

## 2023-07-24 ENCOUNTER — Ambulatory Visit
Admission: EM | Admit: 2023-07-24 | Discharge: 2023-07-24 | Disposition: A | Discharge status: Home / Self Care | Payer: 59 | Attending: Family Medicine | Admitting: Family Medicine

## 2023-07-24 DIAGNOSIS — Z1152 Encounter for screening for COVID-19: Secondary | ICD-10-CM | POA: Diagnosis not present

## 2023-07-24 DIAGNOSIS — R300 Dysuria: Secondary | ICD-10-CM | POA: Insufficient documentation

## 2023-07-24 DIAGNOSIS — B3731 Acute candidiasis of vulva and vagina: Secondary | ICD-10-CM | POA: Diagnosis not present

## 2023-07-24 DIAGNOSIS — N760 Acute vaginitis: Secondary | ICD-10-CM | POA: Diagnosis not present

## 2023-07-24 DIAGNOSIS — J069 Acute upper respiratory infection, unspecified: Secondary | ICD-10-CM | POA: Diagnosis not present

## 2023-07-24 DIAGNOSIS — R051 Acute cough: Secondary | ICD-10-CM | POA: Diagnosis not present

## 2023-07-24 LAB — POCT URINALYSIS DIP (MANUAL ENTRY)
Bilirubin, UA: NEGATIVE
Glucose, UA: NEGATIVE mg/dL
Nitrite, UA: NEGATIVE
Protein Ur, POC: NEGATIVE mg/dL
Spec Grav, UA: >=1.03 — AB (ref 1.010–1.025)
Urobilinogen, UA: 0.2 E.U./dL
pH, UA: 6 (ref 5.0–8.0)

## 2023-07-24 LAB — POCT INFLUENZA A/B
Influenza A, POC: NEGATIVE
Influenza B, POC: NEGATIVE

## 2023-07-24 LAB — POCT RAPID STREP A (OFFICE): Rapid Strep A Screen: NEGATIVE

## 2023-07-24 MED ORDER — FLUCONAZOLE 150 MG PO TABS: 150.0000 mg | ORAL_TABLET | ORAL | 0 refills | Status: DC

## 2023-07-24 MED ORDER — PROMETHAZINE-DM 6.25-15 MG/5ML PO SYRP: 5.0000 mL | ORAL_SOLUTION | Freq: Four times a day (QID) | ORAL | 0 refills | Status: DC | PRN

## 2023-07-24 NOTE — ED Triage Notes (Addendum)
Pt reports sore throat, nasal congestion x1 week. Has been exposed to strep.   Pt also reports "vaginal swelling" and possible yeast infection x4-5 days.

## 2023-07-25 LAB — CERVICOVAGINAL ANCILLARY ONLY
Bacterial Vaginitis (gardnerella): NEGATIVE
Candida Glabrata: NEGATIVE
Candida Vaginitis: POSITIVE — AB
Chlamydia: NEGATIVE
Comment: NEGATIVE
Comment: NEGATIVE
Comment: NEGATIVE
Comment: NEGATIVE
Comment: NEGATIVE
Comment: NORMAL
Neisseria Gonorrhea: NEGATIVE
Trichomonas: NEGATIVE

## 2023-07-25 LAB — URINE CULTURE: Culture: <10000 — AB

## 2023-07-25 LAB — SARS CORONAVIRUS 2 (TAT 6-24 HRS): SARS Coronavirus 2: NEGATIVE

## 2023-07-26 ENCOUNTER — Telehealth: Payer: Self-pay

## 2023-07-26 ENCOUNTER — Telehealth: Payer: Self-pay | Admitting: Nurse Practitioner

## 2023-07-26 ENCOUNTER — Telehealth: Payer: Self-pay | Admitting: Emergency Medicine

## 2023-07-26 MED ORDER — FLUCONAZOLE 150 MG PO TABS: 150.0000 mg | ORAL_TABLET | Freq: Once | ORAL | 0 refills | Status: DC

## 2023-07-26 MED ORDER — FLUCONAZOLE 150 MG PO TABS: 150.0000 mg | ORAL_TABLET | Freq: Once | ORAL | 0 refills | Status: AC

## 2023-07-26 NOTE — Telephone Encounter (Signed)
Patient called and states she wants the diflucan sent to [facility] in [place].  RX sent

## 2023-07-26 NOTE — Telephone Encounter (Signed)
Encounter opened in error

## 2023-07-26 NOTE — Telephone Encounter (Signed)
Patient called clinic requesting more doses of fluconazole to treat yeast infection.  Fluconazole sent to pharmacy.  Positive cytology 07/24/2023 showing candidiasis.

## 2023-07-26 NOTE — Telephone Encounter (Signed)
Attempted to return pt's call from patient access where pt stated she needs "two more"  pt was seen in clinic on 07/24/2023, pt's sx's were consistent with yeast she completed self swab at visit, pt was treated same day for sx's with diflucan.   swab came back positive for yeast on 07/25/2023. Pt is requesting 2 more diflucan stated that she "thinks" its a bad one.
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
## 2023-07-31 ENCOUNTER — Ambulatory Visit (INDEPENDENT_AMBULATORY_CARE_PROVIDER_SITE_OTHER): Payer: 59 | Admitting: Family Medicine

## 2023-07-31 VITALS — BP 136/82 | HR 102 | Temp 97.7°F | Ht 64.0 in | Wt 100.0 lb

## 2023-07-31 DIAGNOSIS — R634 Abnormal weight loss: Secondary | ICD-10-CM | POA: Diagnosis not present

## 2023-07-31 DIAGNOSIS — Z853 Personal history of malignant neoplasm of breast: Secondary | ICD-10-CM | POA: Diagnosis not present

## 2023-07-31 DIAGNOSIS — F321 Major depressive disorder, single episode, moderate: Secondary | ICD-10-CM | POA: Diagnosis not present

## 2023-07-31 MED ORDER — ESCITALOPRAM OXALATE 10 MG PO TABS
10.0000 mg | ORAL_TABLET | Freq: Every day | ORAL | 3 refills | Status: DC
Start: 2023-07-31 — End: 2024-09-03
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
## 2023-08-01 LAB — CBC WITH DIFFERENTIAL/PLATELET
Absolute Monocytes: 561 {cells}/uL (ref 200–950)
Basophils Absolute: 63 {cells}/uL (ref 0–200)
Basophils Relative: 0.8 %
Eosinophils Absolute: 150 {cells}/uL (ref 15–500)
Eosinophils Relative: 1.9 %
HCT: 37.7 % (ref 35.0–45.0)
Hemoglobin: 12.6 g/dL (ref 11.7–15.5)
Lymphs Abs: 1296 cells/uL (ref 850–3900)
MCH: 31.2 pg (ref 27.0–33.0)
MCHC: 33.4 g/dL (ref 32.0–36.0)
MCV: 93.3 fL (ref 80.0–100.0)
MPV: 12.4 fL (ref 7.5–12.5)
Monocytes Relative: 7.1 %
Neutro Abs: 5830 {cells}/uL (ref 1500–7800)
Neutrophils Relative %: 73.8 %
Platelets: 194 10*3/uL (ref 140–400)
RBC: 4.04 10*6/uL (ref 3.80–5.10)
RDW: 11.8 % (ref 11.0–15.0)
Total Lymphocyte: 16.4 %
WBC: 7.9 10*3/uL (ref 3.8–10.8)

## 2023-08-01 LAB — COMPLETE METABOLIC PANEL WITH GFR
AG Ratio: 1.7 (calc) (ref 1.0–2.5)
ALT: 15 U/L (ref 6–29)
AST: 15 U/L (ref 10–35)
Albumin: 4.3 g/dL (ref 3.6–5.1)
Alkaline phosphatase (APISO): 52 U/L (ref 31–125)
BUN: 25 mg/dL (ref 7–25)
CO2: 24 mmol/L (ref 20–32)
Calcium: 9.2 mg/dL (ref 8.6–10.2)
Chloride: 106 mmol/L (ref 98–110)
Creat: 0.81 mg/dL (ref 0.50–0.99)
Globulin: 2.5 g/dL (ref 1.9–3.7)
Glucose, Bld: 81 mg/dL (ref 65–99)
Potassium: 4 mmol/L (ref 3.5–5.3)
Sodium: 140 mmol/L (ref 135–146)
Total Bilirubin: 0.4 mg/dL (ref 0.2–1.2)
Total Protein: 6.8 g/dL (ref 6.1–8.1)
eGFR: 90 mL/min/{1.73_m2} (ref >=60)

## 2023-08-01 LAB — TSH: TSH: 1.04 m[IU]/L

## 2023-08-01 LAB — SEDIMENTATION RATE: Sed Rate: 9 mm/h (ref 0–20)

## 2023-08-02 ENCOUNTER — Other Ambulatory Visit: Payer: Self-pay

## 2023-08-02 ENCOUNTER — Telehealth: Payer: Self-pay | Admitting: Family Medicine

## 2023-08-02 DIAGNOSIS — K219 Gastro-esophageal reflux disease without esophagitis: Secondary | ICD-10-CM

## 2023-08-02 MED ORDER — OMEPRAZOLE 40 MG PO CPDR
40.0000 mg | DELAYED_RELEASE_CAPSULE | Freq: Every day | ORAL | 2 refills | Status: DC
Start: 2023-08-02 — End: 2023-10-30

## 2023-08-02 NOTE — Telephone Encounter (Signed)
Prescription Request  08/02/2023  LOV: 07/31/2023  What is the name of the medication or equipment?   omeprazole (PRILOSEC) 40 MG capsule  **patient almost out of medication**  Have you contacted your pharmacy to request a refill? Yes   Which pharmacy would you like this sent to?  [facility] [place] - [address] [facility] [place] [address] Phone: [phone] Fax: [phone]    Patient notified that their request is being sent to the clinical staff for review and that they should receive a response within 2 business days.   Please advise patient at [phone]. After 11am, call [phone].

## 2023-08-20 ENCOUNTER — Telehealth: Payer: Self-pay

## 2023-08-20 ENCOUNTER — Other Ambulatory Visit: Payer: Self-pay | Admitting: Family Medicine

## 2023-08-20 ENCOUNTER — Other Ambulatory Visit: Payer: Self-pay

## 2023-08-20 DIAGNOSIS — R112 Nausea with vomiting, unspecified: Secondary | ICD-10-CM

## 2023-08-20 DIAGNOSIS — R634 Abnormal weight loss: Secondary | ICD-10-CM

## 2023-08-20 DIAGNOSIS — K219 Gastro-esophageal reflux disease without esophagitis: Secondary | ICD-10-CM

## 2023-08-20 MED ORDER — ONDANSETRON HCL 4 MG PO TABS: 4.0000 mg | ORAL_TABLET | Freq: Once | ORAL | 0 refills | Status: AC

## 2023-08-20 MED ORDER — PROMETHAZINE HCL 25 MG PO TABS: 25.0000 mg | ORAL_TABLET | Freq: Four times a day (QID) | ORAL | 0 refills | Status: AC | PRN

## 2023-08-20 NOTE — Telephone Encounter (Signed)
Pt called and states that the CT scan that was ordered was ordered with contrast. Pt states the last time she tried to drink the contrast, she threw it all up. Pt asks if it can be done without? Thank you.

## 2023-08-22 ENCOUNTER — Other Ambulatory Visit: Payer: 59
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
## 2023-08-31 DIAGNOSIS — N760 Acute vaginitis: Secondary | ICD-10-CM | POA: Diagnosis not present

## 2023-09-17 ENCOUNTER — Telehealth: Payer: Self-pay

## 2023-09-17 NOTE — Telephone Encounter (Signed)
Pt called in wanting to ask nurse if there was a place that she could go to in  [place] to get her imaging done. Pt states that she now works in [place] and would be very hard for her to get off work in time to have this imaging done in [place]. Pt would like to speak with nurse if this is possible please.  Cb#: [phone]

## 2023-10-10 ENCOUNTER — Ambulatory Visit (INDEPENDENT_AMBULATORY_CARE_PROVIDER_SITE_OTHER): Payer: 59

## 2023-10-10 DIAGNOSIS — R932 Abnormal findings on diagnostic imaging of liver and biliary tract: Secondary | ICD-10-CM | POA: Diagnosis not present

## 2023-10-10 DIAGNOSIS — Z90710 Acquired absence of both cervix and uterus: Secondary | ICD-10-CM | POA: Diagnosis not present

## 2023-10-10 DIAGNOSIS — R634 Abnormal weight loss: Secondary | ICD-10-CM

## 2023-10-10 DIAGNOSIS — Z853 Personal history of malignant neoplasm of breast: Secondary | ICD-10-CM

## 2023-10-10 MED ORDER — IOHEXOL 9 MG/ML PO SOLN
500.0000 mL | ORAL | Status: AC
Start: 2023-10-10 — End: 2023-10-10

## 2023-10-10 MED ORDER — IOHEXOL 300 MG/ML  SOLN
100.0000 mL | Freq: Once | INTRAMUSCULAR | Status: AC | PRN
Start: 2023-10-10 — End: 2023-10-10
  Administered 2023-10-10: 100 mL via INTRAVENOUS

## 2023-10-15 ENCOUNTER — Telehealth: Payer: Self-pay

## 2023-10-15 NOTE — Telephone Encounter (Signed)
Copied from CRM [id number]. Topic: Clinical - Lab/Test Results >> Oct 15, 2023  8:06 AM Desma Mcgregor wrote: Reason for CRM: Pt highly concerned about the test results she received via mychart and requests a nurse cb at [phone].  Spoke with pt and advised her of the results from Dr Tanya Nones.

## 2023-10-28 ENCOUNTER — Other Ambulatory Visit: Payer: Self-pay | Admitting: Family Medicine

## 2023-10-28 DIAGNOSIS — K219 Gastro-esophageal reflux disease without esophagitis: Secondary | ICD-10-CM

## 2023-10-30 NOTE — Telephone Encounter (Signed)
Last OV 07/31/23 Requested Prescriptions  Pending Prescriptions Disp Refills   omeprazole (PRILOSEC) 40 MG capsule [Pharmacy Med Name: OMEPRAZOLE DR 40MG   CAP] 90 capsule 2    Sig: Take 1 capsule by mouth at bedtime     Gastroenterology: Proton Pump Inhibitors Failed - 10/28/2023  7:22 AM      Failed - Valid encounter within last 12 months    Recent Outpatient Visits           1 year ago Folliculitis   [facility] Tanya Nones, Priscille [place], MD   2 years ago Vitamin B deficiency   [facility] Pickard, Priscille Heidelberg, MD   2 years ago Chronic fatigue   [facility] Donita [place], MD   2 years ago LLQ abdominal pain   [facility] Tanya Nones, Priscille Heidelberg, MD   2 years ago Screening-pulmonary TB   [facility] Pickard, Priscille Heidelberg, MD

## 2023-10-31 ENCOUNTER — Telehealth: Payer: Self-pay

## 2023-10-31 DIAGNOSIS — L02421 Furuncle of right axilla: Secondary | ICD-10-CM | POA: Diagnosis not present

## 2023-10-31 NOTE — Telephone Encounter (Signed)
Copied from CRM [id number]. Topic: Clinical - Medication Refill >> Oct 31, 2023  3:37 PM Dondra Prader A wrote: Most Recent Primary Care Visit:  Provider: Lynnea Ferrier T  Department: [facility]  Visit Type: SAME DAY  Date: 07/31/2023  Medication: omeprazole (PRILOSEC) 40 MG capsule  Has the patient contacted their pharmacy? Yes (Agent: If no, request that the patient contact the pharmacy for the refill. If patient does not wish to contact the pharmacy document the reason why and proceed with request.) (Agent: If yes, when and what did the pharmacy advise?)  Is this the correct pharmacy for this prescription? YesEllin Mayhew [address] [place], [place] [address] If no, delete pharmacy and type the correct one.  This is the patient's preferred pharmacy:   [facility] [address] [place], [place] [address] Phone: [phone]    [facility] [address] [place] - [address] [facility] [place] [address] Phone: [phone] Fax: [phone]  [facility] [address] [place], [place] [address] [place] [address] Phone: [phone] Fax: [phone]   Has the prescription been filled recently? No  Is the patient out of the medication? Yes  Has the patient been seen for an appointment in the last year OR does the patient have an upcoming appointment? Yes  Can we respond through MyChart? Yes  Agent: Please be advised that Rx refills may take up to 3 business days. We ask that you follow-up with your pharmacy.

## 2023-11-19 ENCOUNTER — Encounter: Payer: Self-pay | Admitting: Family Medicine

## 2023-11-19 ENCOUNTER — Ambulatory Visit (INDEPENDENT_AMBULATORY_CARE_PROVIDER_SITE_OTHER): Payer: 59 | Admitting: Family Medicine

## 2023-11-19 VITALS — BP 110/76 | HR 89 | Temp 98.5°F | Ht 64.0 in | Wt 105.2 lb

## 2023-11-19 DIAGNOSIS — H00035 Abscess of left lower eyelid: Secondary | ICD-10-CM

## 2023-11-19 MED ORDER — PROPRANOLOL HCL 20 MG PO TABS: 20.0000 mg | ORAL_TABLET | Freq: Every day | ORAL | 3 refills | Status: DC

## 2023-11-19 MED ORDER — OMEPRAZOLE 40 MG PO CPDR
40.0000 mg | DELAYED_RELEASE_CAPSULE | Freq: Every day | ORAL | 11 refills | Status: DC
Start: 2023-11-19 — End: 2024-06-20

## 2023-11-19 MED ORDER — PROMETHAZINE HCL 25 MG PO TABS: 25.0000 mg | ORAL_TABLET | Freq: Three times a day (TID) | ORAL | 0 refills | Status: DC | PRN

## 2023-11-19 MED ORDER — SULFAMETHOXAZOLE-TRIMETHOPRIM 800-160 MG PO TABS
1.0000 | ORAL_TABLET | Freq: Two times a day (BID) | ORAL | 0 refills | Status: DC
Start: 2023-11-19 — End: 2024-09-03
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
## 2023-12-18 ENCOUNTER — Ambulatory Visit
Admission: EM | Admit: 2023-12-18 | Discharge: 2023-12-18 | Disposition: A | Discharge status: Home / Self Care | Payer: 59 | Attending: Family Medicine | Admitting: Family Medicine

## 2023-12-18 DIAGNOSIS — B9789 Other viral agents as the cause of diseases classified elsewhere: Secondary | ICD-10-CM | POA: Diagnosis not present

## 2023-12-18 DIAGNOSIS — J988 Other specified respiratory disorders: Secondary | ICD-10-CM | POA: Diagnosis not present

## 2023-12-18 LAB — POC COVID19/FLU A&B COMBO
Covid Antigen, POC: NEGATIVE
Influenza A Antigen, POC: NEGATIVE
Influenza B Antigen, POC: NEGATIVE

## 2023-12-18 LAB — POCT RAPID STREP A (OFFICE): Rapid Strep A Screen: NEGATIVE

## 2023-12-18 MED ORDER — IPRATROPIUM BROMIDE 0.03 % NA SOLN: 2.0000 | Freq: Two times a day (BID) | NASAL | 0 refills | Status: DC

## 2023-12-18 MED ORDER — PROMETHAZINE-DM 6.25-15 MG/5ML PO SYRP: 5.0000 mL | ORAL_SOLUTION | Freq: Three times a day (TID) | ORAL | 0 refills | Status: DC | PRN

## 2023-12-18 MED ORDER — CETIRIZINE HCL 10 MG PO TABS: 10.0000 mg | ORAL_TABLET | Freq: Every day | ORAL | 0 refills | Status: DC

## 2023-12-18 MED ORDER — ACETAMINOPHEN 325 MG PO TABS: 650.0000 mg | ORAL_TABLET | Freq: Four times a day (QID) | ORAL | 0 refills | Status: AC | PRN
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
## 2023-12-18 NOTE — Discharge Instructions (Signed)
 We will manage this as a viral syndrome. For sore throat or cough try using a honey-based tea. Use 3 teaspoons of honey with juice squeezed from half lemon. Place shaved pieces of ginger into 1/2-1 cup of water and warm over stove top. Then mix the ingredients and repeat every 4 hours as needed. Please take Tylenol  500mg -650mg  once every 6 hours for fevers, aches and pains. Hydrate very well with at least 2 liters (64 ounces) of water. Eat light meals such as soups (chicken and noodles, chicken wild rice, vegetable).  Do not eat any foods that you are allergic to.  Start an antihistamine like Zyrtec  (10mg  daily) for postnasal drainage, sinus congestion.  You can take this together with Atrovent  nasal spray times a day or twice daily as needed for the same kind of congestion.

## 2023-12-18 NOTE — ED Triage Notes (Signed)
 Pt reports chills, cough, nausea and sore throat x 1 week. Ibuprofen gives no relief.

## 2023-12-28 ENCOUNTER — Ambulatory Visit
Admission: EM | Admit: 2023-12-28 | Discharge: 2023-12-28 | Disposition: A | Discharge status: Home / Self Care | Payer: 59 | Attending: Family Medicine | Admitting: Family Medicine

## 2023-12-28 ENCOUNTER — Other Ambulatory Visit: Payer: Self-pay

## 2023-12-28 DIAGNOSIS — R300 Dysuria: Secondary | ICD-10-CM | POA: Diagnosis not present

## 2023-12-28 DIAGNOSIS — R109 Unspecified abdominal pain: Secondary | ICD-10-CM | POA: Insufficient documentation

## 2023-12-28 LAB — POCT URINALYSIS DIP (MANUAL ENTRY)
Bilirubin, UA: NEGATIVE
Glucose, UA: NEGATIVE mg/dL
Ketones, POC UA: NEGATIVE mg/dL
Leukocytes, UA: NEGATIVE
Nitrite, UA: NEGATIVE
Spec Grav, UA: >=1.03 — AB (ref 1.010–1.025)
Urobilinogen, UA: 0.2 U/dL
pH, UA: 6.5 (ref 5.0–8.0)

## 2023-12-28 MED ORDER — FLUCONAZOLE 150 MG PO TABS: 150.0000 mg | ORAL_TABLET | Freq: Every day | ORAL | 0 refills | Status: AC

## 2023-12-28 MED ORDER — CEPHALEXIN 500 MG PO CAPS: 500.0000 mg | ORAL_CAPSULE | Freq: Four times a day (QID) | ORAL | 0 refills | Status: AC

## 2023-12-28 NOTE — ED Triage Notes (Signed)
Pt is here with urinary retention that started a week ago, pt has taken OTC and prescribed meds to relieve discomfort.
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
## 2023-12-28 NOTE — Discharge Instructions (Addendum)
Start Keflex 4 times a day for 7 days.  The clinical contact you with results of the urine culture done today if positive.  You may take Diflucan as needed to prevent yeast infection.  Lots of rest and fluids.  Please follow-up with your PCP in 2 to 3 days for recheck.  Please go to the ER for any worsening symptoms.  I hope you feel better soon!

## 2023-12-30 LAB — URINE CULTURE: Culture: NO GROWTH

## 2024-01-30 ENCOUNTER — Encounter: Payer: Self-pay | Admitting: Family Medicine

## 2024-03-13 DIAGNOSIS — Z113 Encounter for screening for infections with a predominantly sexual mode of transmission: Secondary | ICD-10-CM | POA: Diagnosis not present

## 2024-03-13 DIAGNOSIS — Z1272 Encounter for screening for malignant neoplasm of vagina: Secondary | ICD-10-CM | POA: Diagnosis not present

## 2024-03-13 DIAGNOSIS — Z1151 Encounter for screening for human papillomavirus (HPV): Secondary | ICD-10-CM | POA: Diagnosis not present

## 2024-04-03 DIAGNOSIS — M5440 Lumbago with sciatica, unspecified side: Secondary | ICD-10-CM | POA: Diagnosis not present

## 2024-04-03 DIAGNOSIS — Z880 Allergy status to penicillin: Secondary | ICD-10-CM | POA: Diagnosis not present

## 2024-04-03 DIAGNOSIS — F321 Major depressive disorder, single episode, moderate: Secondary | ICD-10-CM | POA: Diagnosis not present

## 2024-04-03 DIAGNOSIS — Z8616 Personal history of COVID-19: Secondary | ICD-10-CM | POA: Diagnosis not present

## 2024-04-03 DIAGNOSIS — G43909 Migraine, unspecified, not intractable, without status migrainosus: Secondary | ICD-10-CM | POA: Diagnosis not present

## 2024-04-03 DIAGNOSIS — I10 Essential (primary) hypertension: Secondary | ICD-10-CM | POA: Diagnosis not present

## 2024-04-03 DIAGNOSIS — Z87892 Personal history of anaphylaxis: Secondary | ICD-10-CM | POA: Diagnosis not present

## 2024-04-03 DIAGNOSIS — Z886 Allergy status to analgesic agent status: Secondary | ICD-10-CM | POA: Diagnosis not present

## 2024-04-03 DIAGNOSIS — I25119 Atherosclerotic heart disease of native coronary artery with unspecified angina pectoris: Secondary | ICD-10-CM | POA: Diagnosis not present

## 2024-04-03 DIAGNOSIS — Z888 Allergy status to other drugs, medicaments and biological substances status: Secondary | ICD-10-CM | POA: Diagnosis not present

## 2024-04-03 DIAGNOSIS — Z882 Allergy status to sulfonamides status: Secondary | ICD-10-CM | POA: Diagnosis not present

## 2024-04-03 DIAGNOSIS — F419 Anxiety disorder, unspecified: Secondary | ICD-10-CM | POA: Diagnosis not present

## 2024-04-27 ENCOUNTER — Other Ambulatory Visit: Payer: Self-pay | Admitting: Nurse Practitioner

## 2024-05-06 ENCOUNTER — Ambulatory Visit
Admission: EM | Admit: 2024-05-06 | Discharge: 2024-05-06 | Disposition: A | Discharge status: Home / Self Care | Attending: Family Medicine | Admitting: Family Medicine

## 2024-05-06 DIAGNOSIS — L03211 Cellulitis of face: Secondary | ICD-10-CM

## 2024-05-06 DIAGNOSIS — L02219 Cutaneous abscess of trunk, unspecified: Secondary | ICD-10-CM

## 2024-05-06 DIAGNOSIS — L03319 Cellulitis of trunk, unspecified: Secondary | ICD-10-CM | POA: Diagnosis not present

## 2024-05-06 MED ORDER — NAPROXEN 500 MG PO TABS
500.0000 mg | ORAL_TABLET | Freq: Two times a day (BID) | ORAL | 0 refills | Status: AC
Start: 2024-05-06 — End: ?

## 2024-05-06 MED ORDER — DOXYCYCLINE HYCLATE 100 MG PO CAPS
100.0000 mg | ORAL_CAPSULE | Freq: Two times a day (BID) | ORAL | 0 refills | Status: DC
Start: 2024-05-06 — End: 2024-09-03

## 2024-05-06 MED ORDER — FLUCONAZOLE 150 MG PO TABS: 150.0000 mg | ORAL_TABLET | ORAL | 0 refills | Status: DC

## 2024-05-06 NOTE — Discharge Instructions (Addendum)
 Please start doxycycline  to address the cellulitis and resolving abscess of the right armpit area.  This antibiotic will help with the cellulitis of the face as well.  Use naproxen for pain and inflammation.  Apply warm compresses to the right armpit area 3-5 times daily 5 to 10 minutes at a time.  If you change your mind about attempting an incision and drainage, you can always return to our clinic.  I would definitely recommend this if you are not improving and definitely if worse within a 48-hour timeframe.

## 2024-05-06 NOTE — ED Triage Notes (Signed)
 Pt c/o bumps that she has picked at to face x 1 week-also to right axilla x 1 week-states she has attempt "to pop it"-NAD-steady gait
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
## 2024-05-22 ENCOUNTER — Other Ambulatory Visit: Payer: Self-pay | Admitting: Family Medicine

## 2024-06-20 ENCOUNTER — Other Ambulatory Visit: Payer: Self-pay

## 2024-06-20 ENCOUNTER — Other Ambulatory Visit: Payer: Self-pay | Admitting: Family Medicine

## 2024-06-20 MED ORDER — OMEPRAZOLE 40 MG PO CPDR: 40.0000 mg | DELAYED_RELEASE_CAPSULE | Freq: Every day | ORAL | 11 refills | Status: DC

## 2024-07-23 ENCOUNTER — Other Ambulatory Visit: Payer: Self-pay | Admitting: Family Medicine

## 2024-07-23 NOTE — Telephone Encounter (Signed)
 Prescription Request  07/23/2024  LOV: 11/19/2023  What is the name of the medication or equipment?   propranolol  (INDERAL ) 20 MG tablet  **90 day script requested**  Have you contacted your pharmacy to request a refill? Yes   Which pharmacy would you like this sent to?  [id number] [address], [place] - [address] [place] [address] Phone: [phone] Fax: [phone]    Patient notified that their request is being sent to the clinical staff for review and that they should receive a response within 2 business days.   Please advise pharmacist.

## 2024-07-24 ENCOUNTER — Telehealth: Payer: Self-pay

## 2024-07-24 NOTE — Telephone Encounter (Signed)
 Called patient to clarify which medication she wanted #90 day supply or #30 day supply . No answer, LVMTCB.

## 2024-07-24 NOTE — Telephone Encounter (Signed)
 Copied from CRM #[id number]. Topic: Clinical - Medication Question >> Jul 23, 2024  5:02 PM Deleta S wrote: Reason for CRM: patient is calling regarding refill request she would like to have a 30 day supply instead of 90 day supply for lower price

## 2024-07-25 ENCOUNTER — Telehealth: Payer: Self-pay

## 2024-07-25 ENCOUNTER — Other Ambulatory Visit: Payer: Self-pay

## 2024-07-25 DIAGNOSIS — G43E09 Chronic migraine with aura, not intractable, without status migrainosus: Secondary | ICD-10-CM

## 2024-07-25 DIAGNOSIS — K219 Gastro-esophageal reflux disease without esophagitis: Secondary | ICD-10-CM

## 2024-07-25 MED ORDER — OMEPRAZOLE 40 MG PO CPDR: 40.0000 mg | DELAYED_RELEASE_CAPSULE | Freq: Every day | ORAL | 2 refills | Status: AC

## 2024-07-25 MED ORDER — PROPRANOLOL HCL 20 MG PO TABS: 20.0000 mg | ORAL_TABLET | Freq: Every day | ORAL | 3 refills | Status: DC

## 2024-07-25 NOTE — Telephone Encounter (Signed)
 Prescription Request  07/25/2024  LOV: 11/19/23  What is the name of the medication or equipment? propranolol  (INDERAL ) 20 MG tablet [[id number]]   Have you contacted your pharmacy to request a refill? Yes   Which pharmacy would you like this sent to?  [id number] [address], [place] - [address] [place] [address] Phone: [phone] Fax: [phone]    Patient notified that their request is being sent to the clinical staff for review and that they should receive a response within 2 business days.   Please advise at [facility] [phone]

## 2024-09-03 ENCOUNTER — Ambulatory Visit: Payer: Self-pay

## 2024-09-03 ENCOUNTER — Encounter: Payer: Self-pay | Admitting: Family Medicine

## 2024-09-03 ENCOUNTER — Ambulatory Visit: Admitting: Family Medicine

## 2024-09-03 VITALS — BP 121/82 | HR 74 | Temp 98.6°F | Ht 64.0 in | Wt 110.0 lb

## 2024-09-03 DIAGNOSIS — B9689 Other specified bacterial agents as the cause of diseases classified elsewhere: Secondary | ICD-10-CM

## 2024-09-03 DIAGNOSIS — J329 Chronic sinusitis, unspecified: Secondary | ICD-10-CM | POA: Diagnosis not present

## 2024-09-03 MED ORDER — CEFDINIR 300 MG PO CAPS: 300.0000 mg | ORAL_CAPSULE | Freq: Two times a day (BID) | ORAL | 0 refills | Status: AC

## 2024-09-03 MED ORDER — FLUCONAZOLE 150 MG PO TABS: 150.0000 mg | ORAL_TABLET | ORAL | 0 refills | Status: AC

## 2024-09-03 NOTE — Assessment & Plan Note (Signed)
 Symptoms consistent with bacterial infection persistent for 2 weeks. Will treat with cefdinir , she has PCN allergy and cannot tolerate Doxy. Has had cefdinir  without reaction in last year. Start cefdinir  300mg  BID x7d. Diflucan  150mg  every  days for yeast infections if symptoms occur. Follow up PRN
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
## 2024-09-03 NOTE — Telephone Encounter (Signed)
 FYI Only or Action Required?: Action required by provider: request for appointment and Request for cefdinir  for symptoms.  Patient was last seen in primary care on 11/19/2023 by Duanne Butler DASEN, MD.  Called Nurse Triage reporting Facial Pain.  Symptoms began several weeks ago.  Interventions attempted: OTC medications: Tylenol  Sinus.  Symptoms are: gradually worsening.  Triage Disposition: See HCP Within 4 Hours (Or PCP Triage)  Patient/caregiver understands and will follow disposition?: Unsure       Copied from CRM [id number]. Topic: Clinical - Red Word Triage >> Sep 03, 2024  9:46 AM Amy B wrote: Red Word that prompted transfer to Nurse Triage: Sinus infection with bleeding, pain and pressure Reason for Disposition  [1] SEVERE sinus pain (e.g., excruciating) AND [2] not improved 2 hours after pain medicine  Answer Assessment - Initial Assessment Questions Patient states she has a really bad sinus infection. Patient offered an appointment for today at [facility] and declined. Patient states she has to pick up her boyfriend today and has to work tomorrow and really needs medication and a doctor's note. Patient requesting an appointment in office if possible. Patient also requesting antibiotics if possible. She states cefdinir  is the only medication that works for her. Preferred pharmacy below.   [facility]  [address], [place], [place] [address]  1. LOCATION: Where does it hurt?      Facial pain pressure  2. ONSET: When did the sinus pain start?  (e.g., hours, days)      Few weeks  3. SEVERITY: How bad is the pain?   (Scale 0-10; or none, mild, moderate or severe)     Severe 4. RECURRENT SYMPTOM: Have you ever had sinus problems before? If Yes, ask: When was the last time? and What happened that time?      Yes  5. NASAL CONGESTION: Is the nose blocked? If Yes, ask: Can you open it or must you breathe through your mouth?     States she has some nasal congestion  6.  NASAL DISCHARGE: Do you have discharge from your nose? If so ask, What color?     Bloody discharge 7. FEVER: Do you have a fever? If Yes, ask: What is it, how was it measured, and when did it start?      Denies 8. OTHER SYMPTOMS: Do you have any other symptoms? (e.g., sore throat, cough, earache, difficulty breathing)     Cough and ear pain  Protocols used: Sinus Pain or Congestion-A-AH

## 2024-09-13 DIAGNOSIS — R519 Headache, unspecified: Secondary | ICD-10-CM | POA: Diagnosis not present

## 2024-09-13 DIAGNOSIS — G43009 Migraine without aura, not intractable, without status migrainosus: Secondary | ICD-10-CM | POA: Diagnosis not present

## 2024-11-06 ENCOUNTER — Other Ambulatory Visit: Payer: Self-pay | Admitting: Family Medicine

## 2024-11-06 NOTE — Telephone Encounter (Unsigned)
 Copied from CRM [id number]. Topic: Clinical - Medication Refill >> Nov 06, 2024  8:28 AM Fonda T wrote: Medication:  rizatriptan  (MAXALT ) 5 MG tablet  propranolol  (INDERAL ) 20 MG tablet   Has the patient contacted their pharmacy? Yes, advised to contact office   This is the patient's preferred pharmacy:  [id number] [place], [place] - [address] AT ACROSS FROM [facility] [address] [place] [address] Phone: [phone] Fax: [phone]   Is this the correct pharmacy for this prescription? Yes If no, delete pharmacy and type the correct one.   Has the prescription been filled recently? Yes  Is the patient out of the medication? Yes  Has the patient been seen for an appointment in the last year OR does the patient have an upcoming appointment? Yes  Can we respond through MyChart? Yes  Agent: Please be advised that Rx refills may take up to 3 business days. We ask that you follow-up with your pharmacy.

## 2024-11-06 NOTE — Telephone Encounter (Signed)
 Unable to pend Propanolol

## 2024-11-07 ENCOUNTER — Ambulatory Visit
Admission: EM | Admit: 2024-11-07 | Discharge: 2024-11-07 | Disposition: A | Discharge status: Home / Self Care | Payer: Self-pay | Attending: Family Medicine | Admitting: Family Medicine

## 2024-11-07 DIAGNOSIS — R21 Rash and other nonspecific skin eruption: Secondary | ICD-10-CM

## 2024-11-07 DIAGNOSIS — L02412 Cutaneous abscess of left axilla: Secondary | ICD-10-CM

## 2024-11-07 DIAGNOSIS — Z760 Encounter for issue of repeat prescription: Secondary | ICD-10-CM

## 2024-11-07 MED ORDER — RIZATRIPTAN BENZOATE 5 MG PO TABS: ORAL_TABLET | ORAL | 0 refills | Status: AC

## 2024-11-07 MED ORDER — CLINDAMYCIN HCL 300 MG PO CAPS: 300.0000 mg | ORAL_CAPSULE | Freq: Three times a day (TID) | ORAL | 0 refills | Status: AC

## 2024-11-07 MED ORDER — HYDROXYZINE HCL 25 MG PO TABS: 12.5000 mg | ORAL_TABLET | Freq: Three times a day (TID) | ORAL | 0 refills | Status: AC | PRN

## 2024-11-07 NOTE — Discharge Instructions (Addendum)
 You have declined incision and drainage for the abscess. Start clindamycin  for the infection. Use warm compresses 3 times daily 5-10 minutes at a time as your schedule allows.   I have refilled your Maxalt  at 10 tablets.   Use hydroxyzine  for itching as needed.

## 2024-11-07 NOTE — ED Triage Notes (Signed)
 Pt reports she has an ingrown hair in the left axilla x 1 week. States she has a painful bump in the left axilla and is getting bigger; itchy rash since last night under breast.   Pt requested rizatriptan  refill.
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
